# Patient Record
Sex: Male | Born: 1960
Health system: Southern US, Academic
[De-identification: ages and names within clinical notes are randomized; demographics above are authoritative.]

## PROBLEM LIST (undated history)

## (undated) ENCOUNTER — Telehealth

## (undated) ENCOUNTER — Ambulatory Visit: Attending: Urology | Primary: Urology

## (undated) ENCOUNTER — Encounter

## (undated) ENCOUNTER — Ambulatory Visit

## (undated) ENCOUNTER — Ambulatory Visit: Payer: BLUE CROSS/BLUE SHIELD

## (undated) ENCOUNTER — Ambulatory Visit: Payer: PRIVATE HEALTH INSURANCE

## (undated) ENCOUNTER — Telehealth: Attending: Urology | Primary: Urology

## (undated) ENCOUNTER — Ambulatory Visit: Attending: Pharmacist | Primary: Pharmacist

## (undated) ENCOUNTER — Encounter: Attending: Physician Assistant | Primary: Physician Assistant

## (undated) ENCOUNTER — Encounter: Payer: PRIVATE HEALTH INSURANCE | Attending: Urology | Primary: Urology

## (undated) DIAGNOSIS — F1011 Alcohol abuse, in remission: Secondary | ICD-10-CM

## (undated) DIAGNOSIS — J45909 Unspecified asthma, uncomplicated: Secondary | ICD-10-CM

## (undated) DIAGNOSIS — K219 Gastro-esophageal reflux disease without esophagitis: Secondary | ICD-10-CM

## (undated) DIAGNOSIS — G4730 Sleep apnea, unspecified: Secondary | ICD-10-CM

## (undated) DIAGNOSIS — M1990 Unspecified osteoarthritis, unspecified site: Secondary | ICD-10-CM

## (undated) DIAGNOSIS — L509 Urticaria, unspecified: Secondary | ICD-10-CM

## (undated) DIAGNOSIS — E785 Hyperlipidemia, unspecified: Secondary | ICD-10-CM

## (undated) DIAGNOSIS — G4700 Insomnia, unspecified: Secondary | ICD-10-CM

## (undated) DIAGNOSIS — F32A Depression, unspecified: Secondary | ICD-10-CM

## (undated) DIAGNOSIS — I10 Essential (primary) hypertension: Secondary | ICD-10-CM

## (undated) DIAGNOSIS — F329 Major depressive disorder, single episode, unspecified: Secondary | ICD-10-CM

## (undated) HISTORY — DX: Insomnia, unspecified: G47.00

## (undated) HISTORY — DX: Hyperlipidemia, unspecified: E78.5

## (undated) HISTORY — DX: Unspecified asthma, uncomplicated: J45.909

## (undated) HISTORY — DX: Urticaria, unspecified: L50.9

---

## 1990-04-24 HISTORY — PX: KNEE SURGERY: SHX244

## 2001-05-29 ENCOUNTER — Ambulatory Visit (HOSPITAL_COMMUNITY): Admission: RE | Admit: 2001-05-29 | Discharge: 2001-05-29 | Payer: Self-pay | Admitting: Family Medicine

## 2003-03-20 ENCOUNTER — Ambulatory Visit (HOSPITAL_COMMUNITY): Admission: RE | Admit: 2003-03-20 | Discharge: 2003-03-20 | Payer: Self-pay | Admitting: Family Medicine

## 2006-01-10 ENCOUNTER — Ambulatory Visit (HOSPITAL_COMMUNITY): Admission: RE | Admit: 2006-01-10 | Discharge: 2006-01-10 | Payer: Self-pay | Admitting: Family Medicine

## 2008-04-04 ENCOUNTER — Emergency Department (HOSPITAL_COMMUNITY): Admission: EM | Admit: 2008-04-04 | Discharge: 2008-04-04 | Payer: Self-pay | Admitting: Emergency Medicine

## 2008-04-07 ENCOUNTER — Inpatient Hospital Stay (HOSPITAL_COMMUNITY): Admission: AD | Admit: 2008-04-07 | Discharge: 2008-04-10 | Payer: Self-pay | Admitting: Family Medicine

## 2008-04-08 ENCOUNTER — Ambulatory Visit: Payer: Self-pay | Admitting: Gastroenterology

## 2008-04-29 ENCOUNTER — Ambulatory Visit: Payer: Self-pay | Admitting: Internal Medicine

## 2008-04-30 ENCOUNTER — Encounter: Payer: Self-pay | Admitting: Urgent Care

## 2008-04-30 LAB — CONVERTED CEMR LAB
AST: 56 units/L — ABNORMAL HIGH (ref 0–37)
Albumin: 4.8 g/dL (ref 3.5–5.2)
Alkaline Phosphatase: 74 units/L (ref 39–117)
Total Protein: 7.3 g/dL (ref 6.0–8.3)

## 2008-05-04 ENCOUNTER — Encounter: Payer: Self-pay | Admitting: Urgent Care

## 2008-05-21 ENCOUNTER — Encounter: Payer: Self-pay | Admitting: Urgent Care

## 2008-05-21 LAB — CONVERTED CEMR LAB
ALT: 83 units/L — ABNORMAL HIGH (ref 0–53)
Alkaline Phosphatase: 55 units/L (ref 39–117)
Indirect Bilirubin: 0.4 mg/dL (ref 0.0–0.9)
Total Protein: 6.9 g/dL (ref 6.0–8.3)

## 2008-08-12 DIAGNOSIS — R197 Diarrhea, unspecified: Secondary | ICD-10-CM

## 2008-08-12 DIAGNOSIS — E7800 Pure hypercholesterolemia, unspecified: Secondary | ICD-10-CM | POA: Insufficient documentation

## 2008-08-12 DIAGNOSIS — I10 Essential (primary) hypertension: Secondary | ICD-10-CM | POA: Insufficient documentation

## 2008-08-12 DIAGNOSIS — R509 Fever, unspecified: Secondary | ICD-10-CM

## 2008-08-12 DIAGNOSIS — F341 Dysthymic disorder: Secondary | ICD-10-CM

## 2008-08-12 DIAGNOSIS — F1010 Alcohol abuse, uncomplicated: Secondary | ICD-10-CM | POA: Insufficient documentation

## 2008-08-12 DIAGNOSIS — I491 Atrial premature depolarization: Secondary | ICD-10-CM

## 2008-08-12 DIAGNOSIS — K219 Gastro-esophageal reflux disease without esophagitis: Secondary | ICD-10-CM | POA: Insufficient documentation

## 2008-08-12 DIAGNOSIS — R1110 Vomiting, unspecified: Secondary | ICD-10-CM

## 2008-08-12 DIAGNOSIS — E860 Dehydration: Secondary | ICD-10-CM

## 2008-08-13 ENCOUNTER — Ambulatory Visit: Payer: Self-pay | Admitting: Gastroenterology

## 2008-08-13 DIAGNOSIS — K625 Hemorrhage of anus and rectum: Secondary | ICD-10-CM

## 2008-08-22 HISTORY — PX: UPPER GASTROINTESTINAL ENDOSCOPY: SHX188

## 2008-08-22 HISTORY — PX: COLONOSCOPY: SHX174

## 2008-08-27 ENCOUNTER — Ambulatory Visit: Payer: Self-pay | Admitting: Gastroenterology

## 2008-08-27 ENCOUNTER — Encounter: Payer: Self-pay | Admitting: Gastroenterology

## 2008-08-27 ENCOUNTER — Ambulatory Visit (HOSPITAL_COMMUNITY): Admission: RE | Admit: 2008-08-27 | Discharge: 2008-08-27 | Payer: Self-pay | Admitting: Gastroenterology

## 2008-08-28 ENCOUNTER — Encounter: Payer: Self-pay | Admitting: Gastroenterology

## 2008-08-31 ENCOUNTER — Encounter: Payer: Self-pay | Admitting: Gastroenterology

## 2008-10-01 ENCOUNTER — Ambulatory Visit: Payer: Self-pay | Admitting: Gastroenterology

## 2009-12-20 ENCOUNTER — Ambulatory Visit: Payer: Self-pay | Admitting: Gastroenterology

## 2009-12-20 DIAGNOSIS — E669 Obesity, unspecified: Secondary | ICD-10-CM

## 2009-12-20 DIAGNOSIS — R740 Nonspecific elevation of levels of transaminase and lactic acid dehydrogenase [LDH]: Secondary | ICD-10-CM

## 2009-12-29 ENCOUNTER — Ambulatory Visit (HOSPITAL_COMMUNITY): Admission: RE | Admit: 2009-12-29 | Discharge: 2009-12-29 | Payer: Self-pay | Admitting: Psychiatry

## 2009-12-31 ENCOUNTER — Encounter: Payer: Self-pay | Admitting: Gastroenterology

## 2010-01-04 ENCOUNTER — Ambulatory Visit (HOSPITAL_COMMUNITY): Admission: RE | Admit: 2010-01-04 | Discharge: 2010-01-04 | Payer: Self-pay | Admitting: Gastroenterology

## 2010-01-05 ENCOUNTER — Encounter (INDEPENDENT_AMBULATORY_CARE_PROVIDER_SITE_OTHER): Payer: Self-pay

## 2010-05-24 NOTE — Letter (Signed)
Summary: NUTRITION REFERRAL  NUTRITION REFERRAL   Imported By: Ave Filter 12/20/2009 11:59:58  _____________________________________________________________________  External Attachment:    Type:   Image     Comment:   External Document

## 2010-05-24 NOTE — Letter (Signed)
Summary: CT SCAN ORDER  CT SCAN ORDER   Imported By: Ave Filter 12/31/2009 09:56:55  _____________________________________________________________________  External Attachment:    Type:   Image     Comment:   External Document

## 2010-05-24 NOTE — Assessment & Plan Note (Signed)
Summary: ELEVATED LIVER ENZYMES/SS   Visit Type:  Consult/follow-up Referring Provider:  Simone Curia Primary Care Provider:  Simone Curia  Chief Complaint:  Elevated liver enzymes.  History of Present Illness: [name] is here for f/u of abnormal LFTs. He has h/o abnormal LFTs dating back to 2008/2009. Felt to have NASH +/- alcohol-related steatohepatits. Abd U/S in 2009 showed CBD 9mm, fatty liver, ?hepatomegaly and borderline splenomegaly. Viral markers negative in 2009. Iron, ferritin, AMA all unremarkable in 2010.   Overall he feels well. Some fatigue. Abd pain if eats bananas or fatty foods. No diarrhea. Heartburn controlled. No dysphagia. Weight down 20 pounds since here but gained all back plus 10 more. Averages about six pack beer per week. No pruritis. No melena, brbpr. 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 No extra excercise.  Labs 12/08/09: Cre 1.05, Total chol 298, HDL 45, LDL 180, Trig 364, Tbili 0.9, AP 59, AST 184, ALT 248, alb 4.7  Current Medications (verified): 1)  Enalapril Maleate 20 Mg Tabs (Enalapril Maleate) .... Take 1 Tablet By Mouth Twice Daily 2)  Effexor Xr 75 Mg Xr24h-Cap (Venlafaxine Hcl) .... Take 1 Tablet By Mouth Two Times A Day 3)  Ambien 10 Mg Tabs (Zolpidem Tartrate) .... As Needed 4)  Lorazepam 2 Mg Tabs (Lorazepam) .... One and One-Half Tablet At Bedtime 5)  Omeprazole 20 Mg Cpdr (Omeprazole) .... Take 1 Tablet By Mouth Once A Day 6)  Hydrochlorothiazide 25 Mg Tabs (Hydrochlorothiazide) .... Take 1 Tablet By Mouth Once A Day 7)  Verelan 240 Mg Xr24h-Cap (Verapamil Hcl) .... Take 1 Tablet By Mouth Once A Day  Allergies (verified): 1)  ! Daypro  Past History:  Past Medical History: GERD Hepatitis-Non Viral-2008, likely EtOH/Obesity Hyperlipidemia Hypertension H/O PACs Insomnia  Family History: No FH of CRC, chronic GI illnesses, or liver disease.  Social History: Married. Architect. Quit tob use over 5 yrs  ago. Drinks 6 beers per week. H/O intranasal cocaine use as teenager.   Review of Systems General:  Complains of fatigue; denies fever, chills, sweats, anorexia, weakness, and weight loss. Eyes:  Denies vision loss. ENT:  Denies nasal congestion, sore throat, hoarseness, and difficulty swallowing. CV:  Denies chest pains, palpitations, dyspnea on exertion, and peripheral edema. Resp:  Denies dyspnea at rest, dyspnea with exercise, cough, sputum, and wheezing. GI:  See HPI. GU:  Denies urinary burning and blood in urine. MS:  Denies joint pain / LOM. Derm:  Denies rash and itching. Neuro:  Denies weakness, frequent headaches, memory loss, and confusion. Psych:  Denies depression and anxiety. Endo:  Denies unusual weight change. Heme:  Denies bruising and bleeding. Allergy:  Denies hives and rash.  Vital Signs:  Patient profile:   50 year old male Height:      70 inches Weight:      257 pounds BMI:     37.01 Temp:     98.8 degrees F oral Pulse rate:   76 / minute BP sitting:   140 / 92  (left arm) Cuff size:   large  Physical Exam  General:  Well developed, well nourished, no acute distress.obese.   Head:  Normocephalic and atraumatic. Eyes:  sclera nonicteric Mouth:  OP moist. Neck:  Supple; no masses or thyromegaly. Lungs:  Clear throughout to auscultation. Heart:  Regular rate and rhythm; no murmurs, rubs,  or bruits. Abdomen:  Obese. Positive BS. NT. No HSM or masses. No abd bruit or hernia. No rebound or guarding. Exam somewhat  limited due to body habitus. Extremities:  No clubbing, cyanosis, edema or deformities noted. Neurologic:  Alert and  oriented x4;  grossly normal neurologically. Skin:  Intact without significant lesions or rashes. Cervical Nodes:  No significant cervical adenopathy. Psych:  Alert and cooperative. Normal mood and affect.  Impression & Recommendations:  Problem # 1:  TRANSAMINASES, SERUM, ELEVATED (ICD-790.4)  Chronically elevated  transaminases, dating back to 2008. Significant increase since 1/10, but similar to levels while hospitalized in 2009. Patient has gained 10-15 pounds since 04/2008. Cholesterol is significantly elevated as well. Continues to consume alcohol, patient reports modest amount. Suspect ongoing NASH +/- alcohol component as well. We need to reevaluate liver via U/S, especially given h/o dilated CBD before. I will set him up with dietician at [facility] to discuss weight management/fatty liver/chol mgt diet. Further recommendations to follow. Patient will need management of his elevated cholesterol, consider non-statin medications until further w/u of abnormal lfts.   Orders: Consultation Level III (65784) I would like to thank Dr. Lubertha South for allowing [place] to take part in the care of this nice patient.   Appended Document: ELEVATED LIVER ENZYMES/SS pT HAS ALCOHOLIC FATTY LIVER DISEASE complicated by severe obesity.

## 2010-05-24 NOTE — Letter (Signed)
Summary: ABD [place] ORDER  ABD [place] ORDER   Imported By: Ave Filter 12/20/2009 11:58:32  _____________________________________________________________________  External Attachment:    Type:   Image     Comment:   External Document

## 2010-05-24 NOTE — Letter (Signed)
Summary: Plan of Care, Need to Discuss  [facility]  [address]   [place], [place] [address]   Phone: [phone]  Fax: [phone]    January 05, 2010  [facility] [address] [place], [place]  [address] 1960/10/15   Dear Mr. STEINKE,   We are writing this letter to inform you of treatment plans and/or discuss your plan of care.  We have tried several times to contact you; however, we have yet to reach you.  We ask that you please contact our office for follow-up on your gastrointestinal issues.  We can  be reached at [phone] to schedule an appointment, or to speak with someone regarding your health care needs.  Please do not neglect your health.   Sincerely,    Hendricks Limes LPN  [facility] Ph: [phone]    Fax: [phone]

## 2010-08-03 ENCOUNTER — Other Ambulatory Visit (HOSPITAL_COMMUNITY): Payer: Self-pay | Admitting: Family Medicine

## 2010-08-03 DIAGNOSIS — R310 Gross hematuria: Secondary | ICD-10-CM

## 2010-08-04 ENCOUNTER — Ambulatory Visit (HOSPITAL_COMMUNITY)
Admission: RE | Admit: 2010-08-04 | Discharge: 2010-08-04 | Disposition: A | Discharge status: Home / Self Care | Payer: BLUE CROSS/BLUE SHIELD | Source: Ambulatory Visit | Attending: Family Medicine | Admitting: Family Medicine

## 2010-08-04 DIAGNOSIS — R310 Gross hematuria: Secondary | ICD-10-CM | POA: Insufficient documentation

## 2010-08-04 DIAGNOSIS — Q619 Cystic kidney disease, unspecified: Secondary | ICD-10-CM | POA: Insufficient documentation

## 2010-08-17 ENCOUNTER — Ambulatory Visit (INDEPENDENT_AMBULATORY_CARE_PROVIDER_SITE_OTHER): Payer: BLUE CROSS/BLUE SHIELD | Admitting: Urology

## 2010-08-17 DIAGNOSIS — N529 Male erectile dysfunction, unspecified: Secondary | ICD-10-CM

## 2010-08-17 DIAGNOSIS — R310 Gross hematuria: Secondary | ICD-10-CM

## 2010-08-17 DIAGNOSIS — N281 Cyst of kidney, acquired: Secondary | ICD-10-CM

## 2010-09-06 NOTE — Op Note (Signed)
Ryan Hickman, Ryan Hickman                ACCOUNT NO.:  [id number]   MEDICAL RECORD NO.:  [id number]          PATIENT TYPE:  AMB   LOCATION:  DAY                           FACILITY:  [facility]   PHYSICIAN:  Kassie Mends, M.D.      DATE OF BIRTH:  1961-02-04   DATE OF PROCEDURE:  DATE OF DISCHARGE:                               OPERATIVE REPORT   PROCEDURES:  1. Ileocolonoscopy with random cold forceps biopsies and snare cautery      polypectomy.  2. Esophagogastroduodenoscopy with cold forceps biopsy of the duodenal      mucosa.   INDICATION FOR EXAM:  Ryan Hickman is a 50 year old male who presents with  chronic diarrhea and rectal bleeding.   FINDINGS:  1. Extremely tortuous colon, which required him to be in the supine      position with pressure in his epigastrium and right flank in order      to successfully intubate the cecum and the terminal ileum.  2. A 6-mm sessile hemorrhagic appearing polyp in the sigmoid colon      approximately 25 cm from the anal verge was removed via snare      cautery.  Otherwise, no masses, inflammatory changes, diverticular      AVMs seen.  Random biopsies obtained via cold forceps to evaluate      for microscopic colitis as an etiology for his diarrhea.  3. Small internal hemorrhoids.  Otherwise normal retroflexed view of      the rectum.  4. Normal esophagus without evidence of Barrett's mass, erosion,      ulceration, or stricture.  5. Normal stomach, duodenal bulb, and second portion of the duodenum.      Biopsies obtained via cold forceps and the duodenum to evaluate for      celiac sprue.   DIAGNOSES:  1. No obvious source for diarrhea identified.  2. Rectal bleeding likely secondary to sigmoid colon polyp and      hemorrhoids.   RECOMMENDATIONS:  1. He should avoid beer and dairy products for the next month.  He is      given a handout on lactose-free high fiber diet.  He was also given      information on polyps and hemorrhoids.  2. We will  call him with results of his biopsies.  If no source for      his diarrhea can be identified and then a 72-hour stool collection      for fecal fat will be performed.  3. No aspirin, NSAIDs, or anticoagulation for 7 days.  4. He already has an appointment to see me on June 10.   MEDICATIONS:  1. Demerol 125 mg IV.  2. Versed 7 mg IV.   PROCEDURE TECHNIQUE:  Physical exam was performed.  Informed consent was  obtained from the patient after explaining the benefits, risks, and  alternatives to the procedure.  The patient was connected to the monitor  and placed in the left lateral position.  Continuous oxygen was provided  by nasal cannula.  IV medicine was administered through an indwelling  cannula.  After administration of sedation and rectal exam, the  patient's rectum was intubated and the scope was advanced under direct  visualization to the distal terminal ileum.  The scope was removed  slowly back after exam of color, texture, anatomy, and integrity of  mucosa on the way out.   After the colonoscopy, the patient's esophagus was intubated with the  diagnostic gastroscope and scope was advanced under direct visualization  to the second portion of the duodenum.  The scope was removed slowly by  carefully examining the color, texture, anatomy, and integrity of the  mucosa on the way out.  The patient was recovered in endoscopy and  discharged home in satisfactory condition.   PATH:  SIMPLE ADENOMA. TCS 10 YRS. NL-COLON & DUODENUM      Kassie Mends, M.D.  Electronically Signed     SM/MEDQ  D:  08/27/2008  T:  08/28/2008  Job:  [id number]   cc:   Donna Bernard, M.D.  Fax: [phone]

## 2010-09-06 NOTE — Assessment & Plan Note (Signed)
NAMESOCRATES, CAHOON                 CHART#:  [id number]   DATE:  04/29/2008                       DOB:  16-May-1960   PRIMARY CARE PHYSICIAN:  Dr. Lubertha South.   CHIEF COMPLAINT:  Followup hospitalization.   PROBLEM LIST:  1. Transaminitis felt to be due to fatty liver/alcoholic.  2. Hepatosplenomegaly.  3. Extrahepatic bile duct of 9 mm (mildly dilated).  4. Protracted viral diarrhea illness/gastroenteritis, requiring      hospitalization.  5. Alcohol abuse.  6. Chronic gastroesophageal reflux disease.  7. Hypertension.  8. Premature atrial contraction.  9. Hypercholesterolemia.  10.Right knee arthroscopy.  11.Depression and anxiety.   SUBJECTIVE:  The patient is a 50 year old Caucasian male who was  admitted to [facility] approximately 3 weeks ago with acute  diarrheal illness and transaminitis.  His symptoms subsided after about  2 weeks with supportive measures.  His bowel movements have almost  completely returned to normal.  He is having anywhere from 2-3 loose  stools per day.  His baseline is 2.  He denies any rectal bleeding or  melena.  Denies any abdominal pain.  He does note borborygmus.  He does  have some mild distention.  He occasionally has nocturnal diaphoresis.  He tells me he has gained 30 pounds in the last 2 months.  He has not  consumed any alcohol since his hospitalization for a total of about 4  weeks without alcohol consumption.  He previously was drinking about 3  beers a day, sometimes up to 6 or more days of the week than not.   Stool studies including culture.  C. diff were negative.  He did have a  positive lactoferrin.  He had negative hepatitis A, B, and C serologies.  He had an AST of 52, ALT of 130, total protein of 5.2, albumin of 2.5,  bilirubin of 0.4, and alkaline phosphatase of 64 all on April 10, 2008.  He recalls having elevated LFTs in 2008 when he was checked for  insurance.   CURRENT MEDICATIONS:  Enalapril 20 mg  daily, lorazepam unknown dose  nightly, Prilosec 20 mg daily, Celexa 20 mg daily, and a new  antihypertensive he did not bring with him.   ALLERGIES:  Daypro cause hives and swelling.   OBJECTIVE:  Vital Signs:  Weight 244 pounds, height 68 inches,  temperature 98.0, blood pressure 132/90, and pulse 80.GENERAL:  The  patient is an obese Caucasian male who is alert, oriented, pleasant, and  cooperative, in no acute distress.  He is accompanied by his wife  today.HEENT:  Sclerae clear, nonicteric.  Conjunctivae pink.  Oropharynx  pink and moist without any lesions.NECK:  Supple without any mass or  thyromegaly.CHEST:  Heart regular rate and rhythm.  Normal S1 and S2.  No murmurs, clicks, rubs, or gallops.ABDOMEN:  Protuberant.  Positive  bowel sounds x4.  No bruits auscultated.  He does have  hepatosplenomegaly.  Liver is palpable 2 fingerbreadths in the right  costal margin.  He has splenomegaly as well.EXTREMITIES:  Without  clubbing or edema.  There is no asterixis.SKIN:  Without jaundice or  rash.   ASSESSMENT:  The patient is a 50 year old Caucasian male admitted with  acute diarrheal illness/gastroenteritis in last month, which has  resolved.  He was also noted to have transaminitis, which  I suspect was  related to underlying obesity/alcoholic hepatitis. His mildly dilated  extrahepatic bile duct  is not clincally significant. I have discussed  this case with Dr. Kassie Mends.  The plan of care is outlined below.   PLAN:  1. Gradual weight loss 1-2 pounds a week, low-salt and low-cholesterol      diet and exercise.  2. Avoid alcohol.  3. Repeat LFTs and make further recommendations.       Lorenza Burton, N.P.  Electronically Signed     Kassie Mends, M.D.  Electronically Signed    KJ/MEDQ  D:  04/29/2008  T:  04/29/2008  Job:  [id number]   cc:   Donna Bernard, M.D.

## 2010-09-06 NOTE — Consult Note (Signed)
NAMEAUTHER, LYERLY                ACCOUNT NO.:  [id number]   MEDICAL RECORD NO.:  [id number]          PATIENT TYPE:  INP   LOCATION:  A323                          FACILITY:  [facility]   PHYSICIAN:  Kassie Mends, M.D.      DATE OF BIRTH:  [birth year]   DATE OF CONSULTATION:  04/08/2008  DATE OF DISCHARGE:                                 CONSULTATION   REASON FOR CONSULTATION:  Elevated liver function tests.   HISTORY OF PRESENT ILLNESS:  The patient is a 50 year old Caucasian  gentleman who was admitted basically with dehydration from persistent  diarrhea.  Ten days ago the patient developed a couple of episodes of  vomiting.  This was followed by diarrhea.  He states he was having  upwards of 30 stools a day.  He did develop some subjective chills,  sweats.  He states he did have a fever as well.  He complained of  diffuse muscle aches.  Symptoms were persisting so he was seen in the  emergency department on April 04, 2008.  He was given fluids and sent  home.  At that time his creatinine was 1.7.  He also had elevated  transaminases with an AST of 194 and ALT of 256.  The patient states  that he was just not able to continue with the protracted diarrhea at  home and he was felt to be orthostatic and dehydrated and therefore was  admitted.  He states he has had elevated LFT's back in 2008 after he was  started on lovastatin.  He states he recently had a life insurance exam  and was told he had elevated LFT's but they did approve him for the  policy.  He states he was checked for viral hepatitis with that exam and  these were negative.  He admittedly drinks about 24 ounces of beer  daily.  He states he has done so for the last six months. He states he  really has not had a chronic history of this, however.  He has a remote  history of intranasal cocaine use.  He has lost about 12 pounds in the  last week due to this illness.  Prior to that he has had an unspecified  amount of weight  gain.  He has chronic GERD, well controlled on  omeprazole.  He denies any dysphagia, odynophagia, hematemesis, melena  or rectal bleeding.  He denies any recent antibiotic use.  No travel  abroad.  No ill contacts.   MEDICATIONS AT HOME:  1. Lovastatin 20 mg daily.  2. Enalapril 20 mg daily.  3. Lorazepam nightly.  4. Prilosec 20 mg daily.  5. Celexa 20 mg daily.   ALLERGIES:  DAYPRO caused anaphylaxis.   PAST MEDICAL HISTORY:  1. Chronic GERD.  2. Hypertension.  3. PAC's.  4. Hypercholesterolemia.  5. History of elevated liver function tests in 2008, as above.  6. Right knee arthroscopy.   FAMILY HISTORY:  Negative for chronic GI illnesses, liver disease,  colorectal cancer.  Positive for CAD and cardiac arrhythmia's.   SOCIAL HISTORY:  He is married.  He has been laid off.  He states he  worked for [employer] for 13 years and then got mad and quit.  He states  he was working for a friend more recently but got laid off.  He has been  out of work since October of 2009.  He quit smoking about 4 years ago.  Remote cocaine use, intranasal as a teenager.  Alcohol use as above.   REVIEW OF SYSTEMS:  See HPI for GI and CONSTITUTIONAL.  CARDIOPULMONARY:  Denies chest pain, shortness of breath, palpitations or cough.  GENITOURINARY:  Denies dysuria or hematuria.   PHYSICAL EXAMINATION:  VITAL SIGNS:  Height 68 inches.  Weight 108 kg.  BMI is 36.  Temperature 97.6, pulse 74, respirations 16, blood pressure  125/75, oxygen saturation 98% on room air.  GENERAL:  Pleasant, obese Caucasian gentleman in no acute distress.  SKIN:  Warm and dry.  No jaundice.  HEENT:  Sclerae nonicteric. Oropharyngeal mucosa moist and pink.  No  lesions, erythema or exudate.  No lymphadenopathy or thyromegaly.  LUNGS:  Clear to auscultation.  CARDIAC:  Exam reveals regular rate and rhythm with normal S1, S2.  No  murmurs, rubs or gallops.  ABDOMEN:  Positive bowel sounds.  Abdomen is obese, soft.  He has  mild  diffuse tenderness to deep palpation.  No rebound or guarding.  No  masses. Liver edge easily palpable below the right costal margin in the  midclavicular line, nontender.  No abdominal hernias or bruits.  LOWER EXTREMITIES:  No edema.   LABORATORY DATA:  White blood cell count 9400, hemoglobin 13, platelet  count 223,000.  Amylase 82.  Sodium 140, potassium 3.9, BUN 5,  creatinine 1.02, glucose 115.  Current total bilirubin is 0.5, alkaline  phosphatase 60, AST currently 69, ALT 171, albumin 2.5.  Abdominal  ultrasound:  Liver is echogenic and mildly enlarged.  Extrahepatic bile  duct was 9 mm, mildly dilated.  Borderline splenomegaly.   IMPRESSION:  Patient is a 50 year old gentleman with acute  gastroenteritis.  He likely has a protracted viral illness.  In  addition, he has elevated transaminases and gives a history of elevated  liver function tests, at least dating back to 2008, after lovastatin was  initiated.  He recently had elevated liver function tests with a life  insurance exam.  I do not have the particular labs for details.  Abdominal ultrasound suggests mild hepatomegaly and borderline  splenomegaly with echogenic liver.  He likely has steatohepatitis,  multifactorial with obesity, daily alcohol use.  Suspect that he does  have a superimposed problem exacerbating his elevated liver function  tests, likely due to cholestasis or a viral illness.  Given his history  of remote drug use, chronic hepatitis needs to be excluded.  Transaminase pattern is not typical of acute viral hepatitis.  He does  not clinically have any typical biliary symptoms but will need to have  his dilated extrahepatic bile ducts evaluated at a later date.   RECOMMENDATIONS:  1. Would initially follow up stool studies, hepatitis panel.  Will go      ahead and add a Clostridium difficile per Dr. Ulyses Southward request.  2. Repeat liver function tests in the morning.  3. Encouraged patient to ask for  Bentyl or Lomotil for diarrhea.  4. Will go ahead and advance him to a low fat, low residue, lactose-      free diet as tolerated.  5. Clinically, no indication for ERCP at this time.  He will likely      need an EUS as an outpatient, however.  6. Advised him to achieve slow, gradual weight loss, approximately 5      to 10 pounds a month, alcohol cessation and daily aerobic exercise      for steatohepatitis.   We would like to thank Dr. Lubertha South for allowing [place] to take part in  the care of this patient.   ADDENDUM 11914:  Pt likely has EtOH hepatitis and is obese.      Tana Coast, P.A.      Kassie Mends, M.D.  Electronically Signed    LL/MEDQ  D:  04/08/2008  T:  04/08/2008  Job:  [id number]

## 2010-09-06 NOTE — H&P (Signed)
NAMEJACON, Ryan Hickman                ACCOUNT NO.:  [id number]   MEDICAL RECORD NO.:  [id number]          PATIENT TYPE:  INP   LOCATION:  A323                          FACILITY:  [facility]   PHYSICIAN:  Scott A. Gerda Diss, MD    DATE OF BIRTH:  1960-12-10   DATE OF ADMISSION:  04/07/2008  DATE OF DISCHARGE:  LH                              HISTORY & PHYSICAL   CHIEF COMPLAINT:  Abdominal discomfort, diarrhea and vomiting.   HISTORY OF PRESENT ILLNESS:  This 50 year old male who has had  approximately 5 days of vomiting and diarrhea.  He began approximately 5-  7 days ago with diarrhea first and then developed vomiting, fever,  headache, body aches, not feeling good.  Progressive diarrhea multiple  times per day ongoing.  He states at times very loose, other times  somewhat mucousy.  Denies any blood in it.  He states he has had off and  on fevers, had headache, muscle aches and discomfort.  He states his  energy level has been subpar.  He also relates intermittent vomiting  over the past several days.  He went to the ER this past Saturday and at  that time they did blood work, which showed elevated liver enzymes of  AST 194 and ALT of 256.  Then today, he presents to the office with the  above symptoms stating he has tried Gatorade and clear liquids and just  feels worse.   PAST MEDICAL HISTORY:  No prior gallbladder or liver troubles.  No prior  gastroenteritis.  No foreign travel.  No camping or drinking of suspect  water.  No contact with anyone with similar symptoms.   FAMILY HISTORY:  Noncontributory.  Positive for diabetes and heart  disease.   PAST MEDICAL HISTORY:  Pertinent for chronic reflux, hypertension, PACs.  He does have a history of smoking and rarely drinks any alcohol.   ALLERGIES:  HE IS ALLERGIC TO DAYPRO.   MEDICATIONS:  1. Lovastatin 20 mg daily.  2. Enalapril 20 mg daily.  3. Lorazepam 2 mg 1-1/2 mL each evening.  4. Celexa 20 mg daily.   It should also be  noted that the patient's last liver profile shows a  slight elevation in liver enzymes in 2008.  It should also be noted that  he states he had a life insurance checkup just about a month and a half  ago and was told everything including liver, HIV, etc. was normal.   PHYSICAL EXAMINATION:  GENERAL:  He looks to feel quite ill.  HEENT:  TMs-NL, T-NL, MM-tacky.  NECK:  Supple.  LUNGS:  Clear.  HEART:  In the 90s, no gallop.  ABDOMEN:  Soft.  Subjective discomfort throughout.  EXTREMITIES:  No edema.   Urinalysis; no ketones.  Blood pressure laying down was 120/80 with a  heart rate of 90, sitting up it was 100/70 with heart rate at 110,  standing it was 80/50 with heart rate of 125-130.   ASSESSMENT/PLAN:  Gastroenteritis with volume depletion, failed  outpatient management.  Recommend IV fluids.  Also recommend checking  lab work  and progressing accordingly.  I expect the patient to gradually  improve.  Will have to look at the liver enzymes again and consider a  hepatitis panel because of elevated liver enzymes.  Monitor the patient  closely.      Scott A. Gerda Diss, MD  Electronically Signed     SAL/MEDQ  D:  04/08/2008  T:  04/08/2008  Job:  [id number]

## 2010-09-09 NOTE — Discharge Summary (Signed)
NAMEJONAVON, Ryan Hickman                ACCOUNT NO.:  [id number]   MEDICAL RECORD NO.:  [id number]          PATIENT TYPE:  INP   LOCATION:  A323                          FACILITY:  [facility]   PHYSICIAN:  Donna Bernard, M.D.DATE OF BIRTH:  [birth year]   DATE OF ADMISSION:  04/07/2008  DATE OF DISCHARGE:  12/18/2009LH                               DISCHARGE SUMMARY   FINAL DIAGNOSES:  1. Gastroenteritis.  2. Abdominal pain secondary to gastroenteritis.  3. Elevated liver enzymes.  4. Hypertension.  5. Hyperlipidemia.  6. Reflux.   DISPOSITION:  1. The patient discharged to home.  2. Discharge medications, low-fat diet, no milk products for the next      7 days.  3. Over-the-counter Imodium as needed for diarrhea.  4. Maintain all chronic meds except hold lovastatin for now.  5. Followup in the office in a week.   INITIAL HISTORY AND PHYSICAL:  Please see H and P as dictated.   HOSPITAL COURSE:  This patient is a 50 year old male with history of  hyperlipidemia, hypertension, anxiety, who presented to the hospital on  the day of admission with a history of 5 days of vomiting and diarrhea.  The patient also had headaches and body aches.  He felt very poorly.  He  had been to the emergency room days previous which revealed elevated  liver enzymes in the neighborhood of 194 AST and 256 for the ALT.  The  patient was admitted to hospital for evaluation.  He was started on IV  rehydration due to his dehydration.  Of note, he was orthostatic upon  presentation with his blood pressure dropping full 40 points when going  from supine to standing.  The GI folks were consulted.  They recommended  C. difficile testing.  Over the next several days, the patient gradually  improved.  We did do some further testing of his renal function which  revealed resolution of his hypokalemia.  The patient's liver enzymes  improved markedly, and on the day of discharge his AST was 52, his ALT  was 130.   Hepatitis panel was done and this returned negative.  His C.  diff tests and stool culture tests were negative, and on the day of  discharge he was still having loose stools, but he had noted significant  improvement.  GI folks went to see him in followup at  their office.  The patient was discharged home with diagnosis and  disposition as noted above.  It should also be noted that we did an  abdominal ultrasound which revealed a fatty liver which was felt to be  part of the etiology to his elevated liver enzymes.      Donna Bernard, M.D.  Electronically Signed     WSL/MEDQ  D:  05/18/2008  T:  05/18/2008  Job:  [id number]

## 2010-09-09 NOTE — Procedures (Signed)
[facility]  Patient:    FRANKO, HILLIKER Visit Number: [id number] MRN: [id number]          Service Type: Attending:  Donna Bernard, M.D. Dictated by:   Donna Bernard, M.D. Proc. Date: 05/29/01                                Stress Test  INDICATION FOR TEST:  The patient had intermittent palpitations along with risk factors for heart disease including male sex, age over 85, and chronic smoking habit.  STRESS TEST:  The stress test was performed under standard Bruce protocol. Resting EKG revealed normal sinus rhythm with no significant ST-T changes. The patient tolerated the first stage well.  During the second stage, there were nonspecific changes with the ST segments The patient then went on to the third stage and had a normal hypertensive response as expected with a blood pressure of 190/94.  At 45 seconds into the fourth stage, the patient reached a maximum heart rate of 160 which surpassed maximal predicted heart rate of 153.  At this rate, there were no ST segments depressed more than 1.0 mm, and in those segments where the depression was less than that, the slope of the ST segment was ascending.  IMPRESSION:  Negative adequate stress test.  PLAN:   The patient was encouraged to have an exercise program and to stop smoking. Dictated by:   Donna Bernard, M.D. Attending:  Donna Bernard, M.D. DD:  09/29/01 TD:  10/01/01 Job: [id number]

## 2010-09-09 NOTE — Procedures (Signed)
NAME:  Ryan Hickman, Ryan Hickman                          ACCOUNT NO.:  [id number]   MEDICAL RECORD NO.:  [id number]                   PATIENT TYPE:  OUT   LOCATION:  DFTL                                 FACILITY:  [facility]   PHYSICIAN:  Donna Bernard, M.D.             DATE OF BIRTH:  19-Jan-1961   DATE OF PROCEDURE:  03/20/2003  DATE OF DISCHARGE:  03/20/2003                                    STRESS TEST   INDICATIONS FOR TEST:  Intermittent chest pain in a 23-something-year-old  gentleman with his strong family history of coronary artery disease  including a sibling and personal risk factors including elevated cholesterol  and chronic smoking.  The pain is atypical at best.   RESULTS:  Stress test was performed at standard Bruce protocol.  Resting EKG  revealed normal sinus rhythm with occasional PACs and no significant ST and  T changes.  The patient tolerated the first two stages well.  During the  third stage, his frequent PACs became even more common.  He surpassed his  submaximal predicted heart rate of 156 and reached a heart rate in the low  160s.  He had a normal hypertensive response as expected.  At the peak heart  rate at 0.08 seconds pass the J-point, there was no significant ST segment  changes in any of the leads.   IMPRESSION:  Negative adequate stress test.   PLAN:  The patient is encouraged to exercise regularly and cut back on  smoking.  Will check another lipid profile.  Follow up in the office as  scheduled.      ___________________________________________                                            Donna Bernard, M.D.   WSL/MEDQ  D:  03/25/2003  T:  03/25/2003  Job:  [id number]

## 2011-01-27 LAB — BASIC METABOLIC PANEL
BUN: 5 mg/dL — ABNORMAL LOW (ref 6–23)
CO2: 25 mEq/L (ref 19–32)
CO2: 26 mEq/L (ref 19–32)
Calcium: 8.8 mg/dL (ref 8.4–10.5)
Chloride: 109 mEq/L (ref 96–112)
GFR calc Af Amer: >60 mL/min (ref >60)
GFR calc non Af Amer: >60 mL/min (ref >60)
Glucose, Bld: 115 mg/dL — ABNORMAL HIGH (ref 70–99)
Glucose, Bld: 118 mg/dL — ABNORMAL HIGH (ref 70–99)
Potassium: 3.1 mEq/L — ABNORMAL LOW (ref 3.5–5.1)
Potassium: 3.9 mEq/L (ref 3.5–5.1)
Sodium: 138 mEq/L (ref 135–145)
Sodium: 140 mEq/L (ref 135–145)

## 2011-01-27 LAB — COMPREHENSIVE METABOLIC PANEL
AST: 194 U/L — ABNORMAL HIGH (ref 0–37)
BUN: 31 mg/dL — ABNORMAL HIGH (ref 6–23)
CO2: 21 mEq/L (ref 19–32)
Calcium: 9 mg/dL (ref 8.4–10.5)
Creatinine, Ser: 1.7 mg/dL — ABNORMAL HIGH (ref 0.4–1.5)
GFR calc Af Amer: 53 mL/min — ABNORMAL LOW (ref >60)
GFR calc non Af Amer: 44 mL/min — ABNORMAL LOW (ref >60)
Total Bilirubin: 0.6 mg/dL (ref 0.3–1.2)

## 2011-01-27 LAB — HEPATIC FUNCTION PANEL
ALT: 130 U/L — ABNORMAL HIGH (ref 0–53)
ALT: 146 U/L — ABNORMAL HIGH (ref 0–53)
ALT: 197 U/L — ABNORMAL HIGH (ref 0–53)
AST: 52 U/L — ABNORMAL HIGH (ref 0–37)
AST: 69 U/L — ABNORMAL HIGH (ref 0–37)
AST: 80 U/L — ABNORMAL HIGH (ref 0–37)
Albumin: 2.5 g/dL — ABNORMAL LOW (ref 3.5–5.2)
Albumin: 2.5 g/dL — ABNORMAL LOW (ref 3.5–5.2)
Albumin: 2.7 g/dL — ABNORMAL LOW (ref 3.5–5.2)
Alkaline Phosphatase: 57 U/L (ref 39–117)
Alkaline Phosphatase: 64 U/L (ref 39–117)
Bilirubin, Direct: 0.1 mg/dL (ref 0.0–0.3)
Bilirubin, Direct: 0.2 mg/dL (ref 0.0–0.3)
Indirect Bilirubin: 0.2 mg/dL — ABNORMAL LOW (ref 0.3–0.9)
Indirect Bilirubin: 0.3 mg/dL (ref 0.3–0.9)
Total Bilirubin: 0.4 mg/dL (ref 0.3–1.2)
Total Bilirubin: 0.4 mg/dL (ref 0.3–1.2)
Total Protein: 5.1 g/dL — ABNORMAL LOW (ref 6.0–8.3)
Total Protein: 5.2 g/dL — ABNORMAL LOW (ref 6.0–8.3)
Total Protein: 5.2 g/dL — ABNORMAL LOW (ref 6.0–8.3)

## 2011-01-27 LAB — URINALYSIS, ROUTINE W REFLEX MICROSCOPIC
Bilirubin Urine: NEGATIVE
Glucose, UA: NEGATIVE mg/dL
Glucose, UA: NEGATIVE mg/dL
Hgb urine dipstick: NEGATIVE
Nitrite: NEGATIVE
Protein, ur: NEGATIVE mg/dL
Specific Gravity, Urine: 1.01 (ref 1.005–1.030)
Specific Gravity, Urine: 1.015 (ref 1.005–1.030)
Urobilinogen, UA: 0.2 mg/dL (ref 0.0–1.0)
pH: 5.5 (ref 5.0–8.0)

## 2011-01-27 LAB — DIFFERENTIAL
Basophils Absolute: 0 10*3/uL (ref 0.0–0.1)
Basophils Absolute: 0.1 10*3/uL (ref 0.0–0.1)
Basophils Relative: 1 % (ref 0–1)
Eosinophils Relative: 2 % (ref 0–5)
Lymphocytes Relative: 18 % (ref 12–46)
Lymphocytes Relative: 19 % (ref 12–46)
Lymphs Abs: 0.9 10*3/uL (ref 0.7–4.0)
Neutro Abs: 3.2 10*3/uL (ref 1.7–7.7)
Neutro Abs: 6.6 10*3/uL (ref 1.7–7.7)
Neutrophils Relative %: 62 % (ref 43–77)
Neutrophils Relative %: 67 % (ref 43–77)

## 2011-01-27 LAB — FECAL LACTOFERRIN, QUANT: Fecal Lactoferrin: POSITIVE

## 2011-01-27 LAB — CLOSTRIDIUM DIFFICILE EIA: C difficile Toxins A+B, EIA: NEGATIVE

## 2011-01-27 LAB — CARDIAC PANEL(CRET KIN+CKTOT+MB+TROPI)
CK, MB: 1.1 ng/mL (ref 0.3–4.0)
Relative Index: INVALID (ref 0.0–2.5)
Total CK: 41 U/L (ref 7–232)

## 2011-01-27 LAB — CBC
HCT: 39.7 % (ref 39.0–52.0)
Hemoglobin: 13 g/dL (ref 13.0–17.0)
MCHC: 35.3 g/dL (ref 30.0–36.0)
MCHC: 35.3 g/dL (ref 30.0–36.0)
MCV: 88.5 fL (ref 78.0–100.0)
RBC: 4.13 MIL/uL — ABNORMAL LOW (ref 4.22–5.81)
RBC: 4.48 MIL/uL (ref 4.22–5.81)
RDW: 12.6 % (ref 11.5–15.5)

## 2011-01-27 LAB — HEPATITIS PANEL, ACUTE
HCV Ab: NEGATIVE
Hep A IgM: NEGATIVE

## 2011-01-27 LAB — STOOL CULTURE

## 2011-01-30 ENCOUNTER — Emergency Department (HOSPITAL_COMMUNITY): Payer: BC Managed Care – PPO

## 2011-01-30 ENCOUNTER — Encounter: Payer: Self-pay | Admitting: Emergency Medicine

## 2011-01-30 ENCOUNTER — Emergency Department (HOSPITAL_COMMUNITY)
Admission: EM | Admit: 2011-01-30 | Discharge: 2011-01-30 | Disposition: A | Discharge status: Home / Self Care | Payer: BC Managed Care – PPO | Attending: Emergency Medicine | Admitting: Emergency Medicine

## 2011-01-30 DIAGNOSIS — K5289 Other specified noninfective gastroenteritis and colitis: Secondary | ICD-10-CM | POA: Insufficient documentation

## 2011-01-30 DIAGNOSIS — K529 Noninfective gastroenteritis and colitis, unspecified: Secondary | ICD-10-CM

## 2011-01-30 DIAGNOSIS — Z79899 Other long term (current) drug therapy: Secondary | ICD-10-CM | POA: Insufficient documentation

## 2011-01-30 DIAGNOSIS — R7989 Other specified abnormal findings of blood chemistry: Secondary | ICD-10-CM | POA: Insufficient documentation

## 2011-01-30 DIAGNOSIS — R197 Diarrhea, unspecified: Secondary | ICD-10-CM | POA: Insufficient documentation

## 2011-01-30 DIAGNOSIS — Z87891 Personal history of nicotine dependence: Secondary | ICD-10-CM | POA: Insufficient documentation

## 2011-01-30 DIAGNOSIS — R1110 Vomiting, unspecified: Secondary | ICD-10-CM | POA: Insufficient documentation

## 2011-01-30 DIAGNOSIS — R109 Unspecified abdominal pain: Secondary | ICD-10-CM | POA: Insufficient documentation

## 2011-01-30 HISTORY — DX: Major depressive disorder, single episode, unspecified: F32.9

## 2011-01-30 HISTORY — DX: Essential (primary) hypertension: I10

## 2011-01-30 HISTORY — DX: Depression, unspecified: F32.A

## 2011-01-30 HISTORY — DX: Gastro-esophageal reflux disease without esophagitis: K21.9

## 2011-01-30 LAB — CBC
HCT: 46.1 % (ref 39.0–52.0)
MCH: 32.1 pg (ref 26.0–34.0)
MCV: 91.5 fL (ref 78.0–100.0)
Platelets: 204 10*3/uL (ref 150–400)
RBC: 5.04 MIL/uL (ref 4.22–5.81)
WBC: 12.4 10*3/uL — ABNORMAL HIGH (ref 4.0–10.5)

## 2011-01-30 LAB — COMPREHENSIVE METABOLIC PANEL
Alkaline Phosphatase: 73 U/L (ref 39–117)
BUN: 14 mg/dL (ref 6–23)
CO2: 23 mEq/L (ref 19–32)
Chloride: 100 mEq/L (ref 96–112)
Creatinine, Ser: 0.88 mg/dL (ref 0.50–1.35)
GFR calc Af Amer: >90 mL/min (ref >90)
GFR calc non Af Amer: >90 mL/min (ref >90)
Glucose, Bld: 99 mg/dL (ref 70–99)
Potassium: 3.5 mEq/L (ref 3.5–5.1)
Total Bilirubin: 1 mg/dL (ref 0.3–1.2)

## 2011-01-30 LAB — DIFFERENTIAL
Basophils Relative: 0 % (ref 0–1)
Eosinophils Relative: 9 % — ABNORMAL HIGH (ref 0–5)
Lymphs Abs: 3.2 10*3/uL (ref 0.7–4.0)
Monocytes Absolute: 1.1 10*3/uL — ABNORMAL HIGH (ref 0.1–1.0)
Monocytes Relative: 9 % (ref 3–12)
Neutro Abs: 7 10*3/uL (ref 1.7–7.7)

## 2011-01-30 LAB — URINALYSIS, ROUTINE W REFLEX MICROSCOPIC
Glucose, UA: NEGATIVE mg/dL
Ketones, ur: NEGATIVE mg/dL
Leukocytes, UA: NEGATIVE
Protein, ur: NEGATIVE mg/dL
Urobilinogen, UA: 0.2 mg/dL (ref 0.0–1.0)

## 2011-01-30 LAB — LIPASE, BLOOD: Lipase: 22 U/L (ref 11–59)

## 2011-01-30 MED ORDER — SODIUM CHLORIDE 0.9 % IV BOLUS (SEPSIS)
500.0000 mL | Freq: Once | INTRAVENOUS | Status: AC
  Administered 2011-01-30: 500 mL via INTRAVENOUS

## 2011-01-30 MED ORDER — HYDROMORPHONE HCL 1 MG/ML IJ SOLN
1.0000 mg | Freq: Once | INTRAMUSCULAR | Status: AC
  Administered 2011-01-30: 1 mg via INTRAVENOUS
  Filled 2011-01-30: qty 1

## 2011-01-30 MED ORDER — SODIUM CHLORIDE 0.9 % IV BOLUS (SEPSIS)
1000.0000 mL | Freq: Once | INTRAVENOUS | Status: AC
  Administered 2011-01-30: 1000 mL via INTRAVENOUS

## 2011-01-30 MED ORDER — IOHEXOL 300 MG/ML  SOLN
100.0000 mL | Freq: Once | INTRAMUSCULAR | Status: AC | PRN
  Administered 2011-01-30: 100 mL via INTRAVENOUS

## 2011-01-30 MED ORDER — PANTOPRAZOLE SODIUM 40 MG IV SOLR
40.0000 mg | Freq: Once | INTRAVENOUS | Status: AC
  Administered 2011-01-30: 40 mg via INTRAVENOUS
  Filled 2011-01-30: qty 40

## 2011-01-30 MED ORDER — DIPHENOXYLATE-ATROPINE 2.5-0.025 MG PO TABS: 1.0000 | ORAL_TABLET | Freq: Four times a day (QID) | ORAL | Status: AC | PRN

## 2011-01-30 MED ORDER — ONDANSETRON 4 MG PO TBDP
4.0000 mg | ORAL_TABLET | Freq: Once | ORAL | Status: AC
  Administered 2011-01-30: 4 mg via ORAL
  Filled 2011-01-30: qty 1

## 2011-01-30 MED ORDER — HYDROCODONE-ACETAMINOPHEN 5-325 MG PO TABS: 1.0000 | ORAL_TABLET | ORAL | Status: AC | PRN

## 2011-01-30 MED ORDER — ONDANSETRON HCL 4 MG PO TABS: 4.0000 mg | ORAL_TABLET | Freq: Four times a day (QID) | ORAL | Status: AC

## 2011-01-30 NOTE — ED Notes (Signed)
Pt c/o lower abd cramping.  edp notified and orders received.

## 2011-01-30 NOTE — ED Provider Notes (Addendum)
History     CSN: [id number] Arrival date & time: 01/30/2011 10:57 AM  Chief Complaint  Patient presents with  . Emesis  . Diarrhea  . Abdominal Pain   patient has had nausea vomiting and diarrhea since last Tuesday (6 days) also complains of diffuse abdominal pain particularly in the right and left lower quadrant. Vomitus is described as undigested food. Diarrhea is watery and frothy. Patient had an episode 3 years ago where his LFTs were elevated for unknown reasons. A colonoscopy and an endoscopy 3-4 years ago by Dr. Darrick Penna was negative. 6 months ago he had an evaluation for hematuria including a negative cystoscopy and negative ultrasound of his kidneys. He drinks moderately. No smoking. As a Curator. Has hypertension and hypercholesterolemia  (Consider location/radiation/quality/duration/timing/severity/associated sxs/prior treatment) Patient is a 50 y.o. male presenting with vomiting, diarrhea, and abdominal pain.  Emesis  Associated symptoms include abdominal pain and diarrhea.  Diarrhea The primary symptoms include abdominal pain, vomiting and diarrhea.  Abdominal Pain The primary symptoms of the illness include abdominal pain, vomiting and diarrhea.    Past Medical History  Diagnosis Date  . Hypertension   . Depression   . Acid reflux     Past Surgical History  Procedure Date  . Knee surgery   . Nasal endoscopy   . Colonoscopy     No family history on file.  History  Substance Use Topics  . Smoking status: Former Games developer  . Smokeless tobacco: Not on file  . Alcohol Use: Yes     couple days a week      Review of Systems  Gastrointestinal: Positive for vomiting, abdominal pain and diarrhea.  All other systems reviewed and are negative.    Allergies  Daypro and Levaquin  Home Medications   Current Outpatient Rx  Name Route Sig Dispense Refill  . ASPIRIN 81 MG PO CHEW Oral Chew 81 mg by mouth daily.      Marland Kitchen DIPHENOXYLATE-ATROPINE 2.5-0.025 MG PO TABS  Oral Take 1 tablet by mouth 3 (three) times daily as needed. diarrhea    . ENALAPRIL MALEATE 20 MG PO TABS Oral Take 20 mg by mouth 2 (two) times daily.      Marland Kitchen HYDROCHLOROTHIAZIDE 25 MG PO TABS Oral Take 25 mg by mouth every morning.      Marland Kitchen LANSOPRAZOLE 15 MG PO CPDR Oral Take 15 mg by mouth every morning.      Marland Kitchen LORAZEPAM 1 MG PO TABS Oral Take 2.5 mg by mouth at bedtime.      . VENLAFAXINE HCL 75 MG PO TABS Oral Take 75 mg by mouth 2 (two) times daily.      Marland Kitchen VERAPAMIL HCL ER 240 MG PO TBCR Oral Take 240 mg by mouth at bedtime.        BP 131/82  Pulse 96  Temp 99.1 F (37.3 C)  Resp 19  Ht 5\' 9"  (1.753 m)  Wt 260 lb (117.935 kg)  BMI 38.40 kg/m2  SpO2 97%  Physical Exam  Nursing note and vitals reviewed. Constitutional: He is oriented to person, place, and time. He appears well-developed and well-nourished.       Obese  HENT:  Head: Normocephalic and atraumatic.  Eyes: Conjunctivae and EOM are normal. Pupils are equal, round, and reactive to light.  Neck: Normal range of motion. Neck supple.  Cardiovascular: Normal rate and regular rhythm.   Pulmonary/Chest: Effort normal and breath sounds normal.  Abdominal: Soft. Bowel sounds are normal.  Minimal right lower quadrant and left lower quadrant tenderness. Abdomen is protuberant secondary to obesity. No acute abdomen  Musculoskeletal: Normal range of motion.  Neurological: He is alert and oriented to person, place, and time.  Skin: Skin is warm and dry.  Psychiatric: He has a normal mood and affect.    ED Course  Procedures (including critical care time)  Labs Reviewed  CBC - Abnormal; Notable for the following:    WBC 12.4 (*)    All other components within normal limits  DIFFERENTIAL - Abnormal; Notable for the following:    Eosinophils Relative 9 (*)    Monocytes Absolute 1.1 (*)    Eosinophils Absolute 1.1 (*)    All other components within normal limits  COMPREHENSIVE METABOLIC PANEL - Abnormal; Notable for  the following:    AST 112 (*)    ALT 261 (*)    All other components within normal limits  URINALYSIS, ROUTINE W REFLEX MICROSCOPIC  LIPASE, BLOOD   No results found. Results for orders placed during the hospital encounter of 01/30/11  CBC      Component Value Range   WBC 12.4 (*) 4.0 - 10.5 (K/uL)   RBC 5.04  4.22 - 5.81 (MIL/uL)   Hemoglobin 16.2  13.0 - 17.0 (g/dL)   HCT 56.2  13.0 - 86.5 (%)   MCV 91.5  78.0 - 100.0 (fL)   MCH 32.1  26.0 - 34.0 (pg)   MCHC 35.1  30.0 - 36.0 (g/dL)   RDW 78.4  69.6 - 29.5 (%)   Platelets 204  150 - 400 (K/uL)  DIFFERENTIAL      Component Value Range   Neutrophils Relative 56  43 - 77 (%)   Lymphocytes Relative 26  12 - 46 (%)   Monocytes Relative 9  3 - 12 (%)   Eosinophils Relative 9 (*) 0 - 5 (%)   Basophils Relative 0  0 - 1 (%)   Neutro Abs 7.0  1.7 - 7.7 (K/uL)   Lymphs Abs 3.2  0.7 - 4.0 (K/uL)   Monocytes Absolute 1.1 (*) 0.1 - 1.0 (K/uL)   Eosinophils Absolute 1.1 (*) 0.0 - 0.7 (K/uL)   Basophils Absolute 0.0  0.0 - 0.1 (K/uL)   WBC Morphology ATYPICAL LYMPHOCYTES     Smear Review LARGE PLATELETS PRESENT    COMPREHENSIVE METABOLIC PANEL      Component Value Range   Sodium 135  135 - 145 (mEq/L)   Potassium 3.5  3.5 - 5.1 (mEq/L)   Chloride 100  96 - 112 (mEq/L)   CO2 23  19 - 32 (mEq/L)   Glucose, Bld 99  70 - 99 (mg/dL)   BUN 14  6 - 23 (mg/dL)   Creatinine, Ser 2.84  0.50 - 1.35 (mg/dL)   Calcium 9.0  8.4 - 13.2 (mg/dL)   Total Protein 6.7  6.0 - 8.3 (g/dL)   Albumin 3.6  3.5 - 5.2 (g/dL)   AST 440 (*) 0 - 37 (U/L)   ALT 261 (*) 0 - 53 (U/L)   Alkaline Phosphatase 73  39 - 117 (U/L)   Total Bilirubin 1.0  0.3 - 1.2 (mg/dL)   GFR calc non Af Amer >90  >90 (mL/min)   GFR calc Af Amer >90  >90 (mL/min)  URINALYSIS, ROUTINE W REFLEX MICROSCOPIC      Component Value Range   Color, Urine YELLOW  YELLOW    Appearance CLEAR  CLEAR    Specific Gravity, Urine 1.020  1.005 - 1.030    pH 6.0  5.0 - 8.0    Glucose, UA  NEGATIVE  NEGATIVE (mg/dL)   Hgb urine dipstick NEGATIVE  NEGATIVE    Bilirubin Urine NEGATIVE  NEGATIVE    Ketones, ur NEGATIVE  NEGATIVE (mg/dL)   Protein, ur NEGATIVE  NEGATIVE (mg/dL)   Urobilinogen, UA 0.2  0.0 - 1.0 (mg/dL)   Nitrite NEGATIVE  NEGATIVE    Leukocytes, UA NEGATIVE  NEGATIVE   LIPASE, BLOOD      Component Value Range   Lipase 22  11 - 59 (U/L)   No diagnosis found.  Routine labs, CT abdomen and pelvis to rule out inflammatory process, IV Zofran and protonic. MDM  Recheck prior to discharge. Feeling much better. His nausea vomiting and diarrhea. Increased pain. Dust elevated liver functions and liver on CT scan. Followup with Dr. Darrick Penna local gastroenterologist        Donnetta Hutching, MD 01/30/11 1626  Donnetta Hutching, MD 01/30/11 1630

## 2011-01-30 NOTE — ED Notes (Signed)
N/v/d x 6 days. abd and all over cramping x 5 days ago.  Mm dry. Alert orietned. No weakness observed at this time.

## 2011-02-01 ENCOUNTER — Other Ambulatory Visit: Payer: Self-pay

## 2011-02-01 ENCOUNTER — Encounter: Payer: Self-pay | Admitting: Gastroenterology

## 2011-02-01 ENCOUNTER — Ambulatory Visit (INDEPENDENT_AMBULATORY_CARE_PROVIDER_SITE_OTHER): Payer: BC Managed Care – PPO | Admitting: Gastroenterology

## 2011-02-01 VITALS — BP 145/90 | HR 96 | Temp 97.6°F | Ht 69.0 in | Wt 268.8 lb

## 2011-02-01 DIAGNOSIS — R1110 Vomiting, unspecified: Secondary | ICD-10-CM

## 2011-02-01 DIAGNOSIS — R197 Diarrhea, unspecified: Secondary | ICD-10-CM

## 2011-02-01 DIAGNOSIS — K760 Fatty (change of) liver, not elsewhere classified: Secondary | ICD-10-CM

## 2011-02-01 DIAGNOSIS — R7402 Elevation of levels of lactic acid dehydrogenase (LDH): Secondary | ICD-10-CM

## 2011-02-01 NOTE — Progress Notes (Signed)
Referring Provider: Ardyth Gal, MD Primary Care Physician:  Harlow Asa, MD, MD Primary Gastroenterologist: Dr. Darrick Penna  Chief Complaint  Patient presents with  . Follow-up    from ER/somw what better but tried now    HPI:   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 EGD/colonoscopy done in 2010. Reports recent acute illness of N/V/D. Was on cruise ship, [place], [place], never went off boat. had N/V/D. A week ago, Monday morning. Got up around noon Tuesday, ate lunch in dining room. Around 4pm, acutely ill.  Went to [employer] on on cruise ship, got IVFs, medication. Quarantined. By Friday felt better. Saturday night started all over again. Went to ED Monday morning. Small amount of diarrhea yesterday morning.  No diarrhea today. No further N/V. Feel whipped.   Trying to cut down on alcohol. Went to counseling for ETOH abuse for 5 visits. ETOH a few times per week, not daily. About a 6 pack at a time.   No difficulties with GERD, on Prevacid 15 mg.  Feels like food sometimes gets stuck, points at throat. No bottom teeth. Not chewing well.   Feels like these episodes happen once every 6 mos, N/V/D.  Eats a lot of cheese.   Weighed 257 in Aug 2011. Now up 11 lbs.   In ED, CBC normal but slightly elevated WBC (likely reactive), Lipase normal, AST 112, ALT 261.  Hep B, C negative in 2009  Back in Aug 2011: AST 184, ALT 248  CT Oct 2012: Mild hepatic steatosis. Hepatomegaly, 20 cm cranial caudal. No focal liver lesion.   Past Medical History  Diagnosis Date  . Hypertension   . Depression   . Acid reflux   . Hyperlipidemia   . Insomnia   . Hepatitis     non-viral, r/t ETOH/obesity    Past Surgical History  Procedure Date  . Knee surgery   . Colonoscopy May 2010    Dr. Darrick Penna: simple adenoma, normal colon. Repeat 2020  . Upper gastrointestinal endoscopy May 2010    Dr. Darrick Penna: normal    Current Outpatient  Prescriptions  Medication Sig Dispense Refill  . aspirin 81 MG chewable tablet Chew 81 mg by mouth daily.        . diphenoxylate-atropine (LOMOTIL) 2.5-0.025 MG per tablet Take 1 tablet by mouth 3 (three) times daily as needed. diarrhea      . enalapril (VASOTEC) 20 MG tablet Take 20 mg by mouth 2 (two) times daily.        . hydrochlorothiazide (HYDRODIURIL) 25 MG tablet Take 25 mg by mouth every morning.        Marland Kitchen HYDROcodone-acetaminophen (NORCO) 5-325 MG per tablet Take 1-2 tablets by mouth every 4 (four) hours as needed for pain.  20 tablet  0  . lansoprazole (PREVACID) 15 MG capsule Take 15 mg by mouth every morning.        Marland Kitchen LORazepam (ATIVAN) 1 MG tablet Take 2.5 mg by mouth at bedtime.        . ondansetron (ZOFRAN) 4 MG tablet Take 1 tablet (4 mg total) by mouth every 6 (six) hours.  15 tablet  0  . venlafaxine (EFFEXOR) 75 MG tablet Take 75 mg by mouth 2 (two) times daily.        . verapamil (CALAN-SR) 240 MG CR tablet Take 240 mg by mouth at bedtime.        . diphenoxylate-atropine (LOMOTIL) 2.5-0.025 MG  per tablet Take 1 tablet by mouth 4 (four) times daily as needed for diarrhea/loose stools.  30 tablet  0  . LORazepam (ATIVAN) 2 MG tablet         Allergies as of 02/01/2011 - Review Complete 02/01/2011  Allergen Reaction Noted  . Daypro (oxaprozin)    . Levaquin  01/30/2011    Family History  Problem Relation Age of Onset  . Colon cancer Neg Hx     History   Social History  . Marital Status: Married    Spouse Name: N/A    Number of Children: N/A  . Years of Education: N/A   Social History Main Topics  . Smoking status: Former Games developer  . Smokeless tobacco: None  . Alcohol Use: Yes     couple days a week  . Drug Use: No  . Sexually Active:    Other Topics Concern  . None   Social History Narrative  . None    Review of Systems: Gen: Denies fever, chills, anorexia. Denies fatigue, weakness, weight loss.  CV: Denies chest pain, palpitations, syncope,  peripheral edema, and claudication. Resp: Denies dyspnea at rest, cough, wheezing, coughing up blood, and pleurisy. GI: Denies vomiting blood, jaundice, and fecal incontinence.   Denies odynophagia. Vague dysphagia, ?not chewing well Derm: Denies rash, itching, dry skin Psych: Denies depression, anxiety, memory loss, confusion. No homicidal or suicidal ideation.  Heme: Denies bruising, bleeding, and enlarged lymph nodes.  Physical Exam: BP 145/90  Pulse 96  Temp(Src) 97.6 F (36.4 C) (Temporal)  Ht 5\' 9"  (1.753 m)  Wt 268 lb 12.8 oz (121.927 kg)  BMI 39.69 kg/m2 General:   Alert and oriented. No distress noted. Pleasant and cooperative.  Head:  Normocephalic and atraumatic. Eyes:  Conjuctiva clear without scleral icterus. Mouth:  Oral mucosa pink and moist. No bottom teeth.  Neck:  Supple, without mass or thyromegaly. Heart:  S1, S2 present without murmurs, rubs, or gallops. Regular rate and rhythm. Abdomen:  +BS, soft, largely obese, non-tender and non-distended. No rebound or guarding. No HSM noted. Probably umbilical hernia. Msk:  Symmetrical without gross deformities. Normal posture. Extremities:  Without edema. Neurologic:  Alert and  oriented x4;  grossly normal neurologically. Skin:  Intact without significant lesions or rashes. Cervical Nodes:  No significant cervical adenopathy. Psych:  Alert and cooperative. Normal mood and affect.

## 2011-02-01 NOTE — Patient Instructions (Addendum)
Add a probiotic daily.  Continue Prevacid daily.   If you have any further episodes of diarrhea, nausea or vomiting, please contact our office immediately.  Please see the low-fat diet handout. This is important for weight loss and to help your liver.  In 6 weeks, please complete labs to recheck your liver. We will call you with those results.  Recommend 1-2# weight loss per week until ideal body weight through exercise & diet. Low fat/cholesterol diet. Gradually increase exercise from 15 min daily up to 1 hr per day 5 days/week. Limit alcohol use.

## 2011-02-02 ENCOUNTER — Encounter: Payer: Self-pay | Admitting: Gastroenterology

## 2011-02-02 NOTE — Assessment & Plan Note (Addendum)
N/V/D while on cruise ship. Likely acute illness, now resolved. If diarrhea returns, contact office for stool studies. Advised to try different PPI; he would rather stick with the Prevacid OTC. Probiotic daily.   6 mos f/u

## 2011-02-02 NOTE — Assessment & Plan Note (Addendum)
50 year old Caucasian male with chronic elevated transaminases, since 2008/2009. Recent AST 112, ALT 261, constistent/similar with prior labs from Aug 2011. This is in the setting of increased wt (11 lbs), recent probably gastroenteritis, self-limiting. Pt also continues to drink alcohol but is trying to cut back. CT findings as describe above. Concerning for continued wt gain, ETOH intake, with likely end point of cirrhosis. Discussed at length diet, exercise, behavioral changes. Will need to repeat LFTs in 6 weeks. Consider further work-up if remain elevated. Viral markers negative in 2009.

## 2011-02-02 NOTE — Progress Notes (Signed)
Cc to PCP 

## 2011-03-23 NOTE — Progress Notes (Signed)
WOULD REPEAT HEP ABC SEROLOGIES, & CHECK FOR AIH, AND FASTING FERRITIN/IRON SAT. NO ADDITIONAL WORKUP UNLESS ENZYMES ELEVATED AND PT LOSING WEIGHT AND ABSTAINING FROM ETOH. OPV W/ SLF IN 4 MOS E 30 VISIT, Dx: elevated liver enzymes, ETOH abuse, severe obesity

## 2011-04-20 ENCOUNTER — Encounter: Payer: Self-pay | Admitting: Pulmonary Disease

## 2011-04-20 ENCOUNTER — Ambulatory Visit (INDEPENDENT_AMBULATORY_CARE_PROVIDER_SITE_OTHER): Payer: BC Managed Care – PPO | Admitting: Pulmonary Disease

## 2011-04-20 DIAGNOSIS — G4733 Obstructive sleep apnea (adult) (pediatric): Secondary | ICD-10-CM

## 2011-04-20 DIAGNOSIS — G4700 Insomnia, unspecified: Secondary | ICD-10-CM

## 2011-04-20 NOTE — Patient Instructions (Signed)
You have insomnia & may have obstructive sleep apnea triaL of lunesta 3 mg at bedtime - decrease ativan to 1mg  when you take this Sleep study -at [facility]  (preferred) or [facility]

## 2011-04-20 NOTE — Assessment & Plan Note (Addendum)
Chronic - of sleep onset & maintenance On Lorazepam x 2 yrs - likely with decreased effect now. Will trial lunesta 3 mg & if this works - he will taper lorazepam by 1 mg every 15 days to off. Trazodone would be a good choice but he reports some problems in the past -he is not very clear about this & perhaps dr Gerda Diss can confirm Discussed simple rules of cognitive behaviour therapy - Rules of sleep hygiene were discussed  - light exercise -avoid caffeinated beverages - no more than 20 mins staying awake in bed, if not asleep, get out of bed & reading or light music - No TV or computer games at bedtime.

## 2011-04-20 NOTE — Assessment & Plan Note (Signed)
Given obesity, narrow pharyngeal exam & loud snoring, obstructive sleep apnea is very likely & an overnight polysomnogram will be scheduled as a split study in thelab due to co-morbid insomnia. The pathophysiology of obstructive sleep apnea , it's cardiovascular consequences & modes of treatment including CPAP were discused with the patient in detail & they evidenced understanding.

## 2011-04-20 NOTE — Progress Notes (Signed)
  Subjective:    Patient ID: Ryan Hickman, male    DOB: Dec 03, 1960, 50 y.o.   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  History is self reported  - no bed partner history available. For 8 yrs, since he quit smoking, he has difficulty sleeping. He has prolonged sleep latency & also inability to maintain sleep - wakes up every 2 h. He has tried [place] (no effect), restoril (some benefit),? Trazodone (side effects) & has been taking lorazepam 3 mg since 2010. He is also on prozac daytime for  Depression.  - effexor made him irritable. He reports vivid dreams that bring back memories of childhood, but do not seem to be traumatic events. Bedtime is 8pm, latency 1-2 h, sleeps on his side with 1 pilllow, 5-10 awakenings including nocturia, snoring has occasionally wooken himself up, oob at 0600 feeling tired, no dryness or headaches. He has gained 30 lbs in the last 2 yrs. He drinks a cup of coffe daily & beer on weekends, no caffeinated drinks. ESS 3/24 Denies drug use, leg movements, no witnessed apneas. He has chronic reflux but this does not wake him up at night. 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  HENT: Negative for ear pain, congestion, sore throat, rhinorrhea, sneezing, trouble swallowing, dental problem and postnasal drip.   Eyes: Negative for redness.  Respiratory: Negative for cough, shortness of breath and wheezing.   Cardiovascular: Negative for chest pain, palpitations and leg swelling.  Gastrointestinal: Negative for nausea, vomiting, abdominal pain and diarrhea.  Genitourinary: Negative for dysuria and urgency.  Musculoskeletal: Negative for joint swelling.  Skin: Negative for rash.  Neurological: Negative for syncope  and headaches.  Hematological: Does not bruise/bleed easily.  Psychiatric/Behavioral: Positive for dysphoric mood. The patient is not nervous/anxious.        Objective:   Physical Exam  Gen. Pleasant, obese, in no distress, normal affect ENT - no lesions, no post nasal drip, class 2-3 airway Neck: No JVD, no thyromegaly, no carotid bruits Lungs: no use of accessory muscles, no dullness to percussion, decreased without rales or rhonchi  Cardiovascular: Rhythm regular, heart sounds  normal, no murmurs or gallops, no peripheral edema Abdomen: soft and non-tender, no hepatosplenomegaly, BS normal. Musculoskeletal: No deformities, no cyanosis or clubbing Neuro:  alert, non focal, no tremors       Assessment & Plan:

## 2011-04-21 ENCOUNTER — Ambulatory Visit: Payer: BC Managed Care – PPO | Attending: Pulmonary Disease | Admitting: Sleep Medicine

## 2011-04-21 VITALS — BP 124/72 | HR 84 | Resp 18 | Ht 69.0 in | Wt 270.0 lb

## 2011-04-21 DIAGNOSIS — Z6841 Body Mass Index (BMI) 40.0 and over, adult: Secondary | ICD-10-CM | POA: Insufficient documentation

## 2011-04-21 DIAGNOSIS — G4733 Obstructive sleep apnea (adult) (pediatric): Secondary | ICD-10-CM | POA: Insufficient documentation

## 2011-04-24 ENCOUNTER — Telehealth: Payer: Self-pay | Admitting: Pulmonary Disease

## 2011-04-24 NOTE — Telephone Encounter (Signed)
Pt just had sleep study on 04/21/11. Unable to get sleep lab on phone to verify if study has been read. I don't believe we will be able to get cpap set up today. DME company will have to check for pre cert on cpap and usually it takes 3-4 days to hear back from insurance on benefits. I spoke with patient and he is aware that CPAP will not be set up today. Pt is aware that triage will be calling to address the issue of his medication.

## 2011-04-24 NOTE — Telephone Encounter (Signed)
I spoke with pt and he states that he started the lunesta 3 mg and decreased the ativan down while taking this and felt like it helped on the first 2 nights. Pt states now it is taking him 2-3 hrs to fall asleep. Pt is requesting recs from Dr. Vassie Loll. Please advise, thanks  Allergies  Allergen Reactions  . Daypro (Oxaprozin)     REACTION: Caused by anaphylaxis  . Levaquin     hives

## 2011-04-27 NOTE — Telephone Encounter (Signed)
Pt would like to speak w/ someone today regarding the sleeping pills he was given as a sample.  Pt requested to be reached at [phone].  Antionette Fairy

## 2011-04-27 NOTE — Telephone Encounter (Signed)
Pl have sleep lab fax study

## 2011-04-27 NOTE — Telephone Encounter (Signed)
Pt aware we are waiting on recs from Dr. Vassie Loll and will call him back later today.

## 2011-04-28 ENCOUNTER — Telehealth: Payer: Self-pay | Admitting: Pulmonary Disease

## 2011-04-28 DIAGNOSIS — G4733 Obstructive sleep apnea (adult) (pediatric): Secondary | ICD-10-CM

## 2011-04-28 NOTE — Telephone Encounter (Signed)
PSG showed moderate degree of obstructive sleep apnea - stopped breathing 26/h, corrected by CPAP 12 cm , standard fisher paykel mask - Rx sent to DME

## 2011-04-28 NOTE — Telephone Encounter (Signed)
Ok to [employer] 3 mg x 30  Decrase ativan to 2 mg x 15 days , then 1 mg x 1 5 days FU in 30 ds

## 2011-04-28 NOTE — Telephone Encounter (Signed)
Per [name] pt is still wanting to about his sleep pills. Please advise Dr. Vassie Loll. thanks

## 2011-05-01 MED ORDER — ESZOPICLONE 3 MG PO TABS: 3.0000 mg | ORAL_TABLET | Freq: Every evening | ORAL | Status: DC | PRN

## 2011-05-01 NOTE — Telephone Encounter (Signed)
I spoke with pt and advised him of RA recs. He voiced his understanding and rx was called into [facility] per pt request. Pt is scheduled to come in and see RA on 05/23/11 at 4:15

## 2011-05-02 ENCOUNTER — Telehealth: Payer: Self-pay | Admitting: Pulmonary Disease

## 2011-05-02 NOTE — Telephone Encounter (Signed)
Done at [facility]

## 2011-05-02 NOTE — Telephone Encounter (Signed)
Pl find out from his pharmacy what meds are covered

## 2011-05-02 NOTE — Telephone Encounter (Signed)
I informed pt of RA's findings and recommendations. Pt verbalized understanding  [facility] states PSG is needed not just the titration. Dr. Vassie Loll do you have this?

## 2011-05-02 NOTE — Telephone Encounter (Signed)
Lunesta 3mg  is not covered by pt's insurance. I called pharmacy and they advised no alternatives given. Pt states he has tried and failed Ambien and Trazodone in the past. Dr. Vassie Loll please advise an alternative, thanks.

## 2011-05-03 NOTE — Telephone Encounter (Signed)
Pl have pt call his insurance then & get me names of some alternatives. All the above meds have not worked for him ,he said

## 2011-05-03 NOTE — Telephone Encounter (Signed)
Dr. Vassie Loll, the pharmacy does not know. Do you want to just try another medication and see if it is covered? Pt states multiple medications were discussed during OV that pt has tried in the past that has not worked such as Trazodone and Ambien. Please advise, thanks.

## 2011-05-04 ENCOUNTER — Ambulatory Visit: Payer: BC Managed Care – PPO | Admitting: Gastroenterology

## 2011-05-04 MED ORDER — TEMAZEPAM 15 MG PO CAPS: ORAL_CAPSULE | ORAL | Status: DC

## 2011-05-04 NOTE — Telephone Encounter (Signed)
Called and spoke with pt.  Left him 1 weeks worth of lunesta 3mg  samples.  Pt is aware they will be at the front desk for him to pick up.  Also informed him rx for temazepam sent to pharmacy for him to try.  Pt was ok with this.

## 2011-05-04 NOTE — Telephone Encounter (Signed)
Pt has returned triage's call.  Ryan Hickman ° °

## 2011-05-04 NOTE — Telephone Encounter (Signed)
Ok to give hims samples of lunesta x 1 week Please send Rx for Temazepam 15 mg , take 1-2 tabs qhs prn insomnia x 30

## 2011-05-04 NOTE — Telephone Encounter (Signed)
Pt has returned a phone Mindy's call and his "drug people" told him they would cover  temazepam.  Pt can be reached at [phone].  Antionette Fairy

## 2011-05-04 NOTE — Telephone Encounter (Signed)
RA, pt called back stating his ins informed him they would cover Temazepam.  Please advise.  Thanks!Marland Kitchen

## 2011-05-04 NOTE — Procedures (Signed)
[name], Hickman                ACCOUNT NO.:  [id number]  MEDICAL RECORD NO.:  [id number]          PATIENT TYPE:  OUT  LOCATION:  SLEEP CENTER                 FACILITY:  [facility]  PHYSICIAN:  Oretha Milch, MD      DATE OF BIRTH:  1961/04/22  DATE OF STUDY:  04/21/2011                           NOCTURNAL POLYSOMNOGRAM  REFERRING PHYSICIAN:  Laural Golden  INDICATION FOR STUDY:  Loud snoring, frequent awakenings, and non- refreshing sleep in this obese gentleman with hypertension and depression.  At the time of this study, he weighed 270 pounds with a height of 5 feet 9 inches, BMI of 40, and neck size of 20 inches.  EPWORTH SLEEPINESS SCORE:  2.  MEDICATIONS:  Fluoxetine, enalapril, hydrochlorothiazide, Prilosec, lorazepam, aspirin, verapamil.  His longstanding insomnia maintained on 3 mg of lorazepam at bedtime.  This intervention polysomnogram was performed with a sleep technologist in attendance, EEG, EOG, EMG, EKG, and respiratory parameters were recorded.  Sleep stages, arousals, limb movements, and respiratory data were scored according to criteria laid out by the American Academy of Sleep Medicine.  SLEEP ARCHITECTURE:  Lights out was at 9:33 p.m., lights on was at 4:40 a.m.  During the baseline portion, total sleep time was 130 minutes with a sleep period time of 259 minutes.  Sleep stages as the percentage of total sleep time was N1 19%, N2 58%, REM sleep 16% (43 minutes), and 6% slow wave sleep.  Supine REM was noted for 18 minutes.  REM sleep was noted around 3:30 a.m.  RESPIRATORY DATA:  During the baseline portion, there were total of 3 obstructive apneas, 1 central apnea, 0 mixed apnea, and 52 hypopneas with apnea/hypopnea index of 28 events/hour and the lowest desaturation of 84%.  Due to this degree of respiratory disturbance, CPAP was initiated at 5 cm and titrated to a final pressure of 12 cm.  At this level for 56 minutes of sleep including 39  minutes of REM sleep and 14 minutes of REM supine sleep, no events or desaturations were noted. This appears to be the optimal pressure used during the study.  AROUSAL DATA:  There were a total of 39 arousals with an arousal index of 9 events per hour.  Of these, 15 were spontaneous, the rest were associated with respiratory events.  OXYGEN DATA:  The desaturation index was 17 events per hour.  The lowest desaturation was 82%.  He spent 1.6 minutes with a saturation less than 88%.  CARDIAC DATA:  No arrhythmias were noted.  The low heart rate recorded was 37 beats per minute.  The high heart rate recorded was an artifact.  MOVEMENT-PRASOMNIA:  There were 160 periodic limb movements with a PLM index of 36 events per hour, however, only 3 of these were associated with arousals with a PLM arousal index of 0.7 events per hour.  DISCUSSION:  He was desensitized with a standard Fisher and Paykel mask. He met nice split night protocol due to an AHI of 25.7 events per hour.  IMPRESSION-RECOMMENDATIONS: 1. Moderate obstructive sleep apnea with predominant hypopneas causing     sleep fragmentation and oxygen desaturation. 2. This seems to be  corrected by a CPAP of 12 cm with a full-face     mask.  Titration was optimal. 3. Periodic limb movements were noted, but not associated with     arousals.  The significance of this is unclear. 4. No evidence of cardiac arrhythmias or behavioral disturbance during     sleep.  Recommendations: 1. The treatment options for this degree of sleep disordered breathing     include weight loss and CPAP therapy. 2. CPAP can be initiated with the Fisher and Paykel mask at a pressure     of 12 cm with heated humidity. 3. Compliance should be monitored at this level. 4. He should be cautioned against medications and sedative side     effects.  He should be advised against driving when sleepy.     Oretha Milch, MD    RVA/MEDQ  D:  05/03/2011 17:04:27   T:  05/04/2011 07:21:33  Job:  [id number]

## 2011-05-04 NOTE — Telephone Encounter (Signed)
I spoke with pt and advised him to call his insurance to provide [place] alternatives to what they will cover. Pt states he will see what he can do. Pt is requesting samples of lunesta in the meantime. Please advise Dr. Vassie Loll, thanks

## 2011-05-08 DIAGNOSIS — G4710 Hypersomnia, unspecified: Secondary | ICD-10-CM

## 2011-05-08 DIAGNOSIS — G4730 Sleep apnea, unspecified: Secondary | ICD-10-CM

## 2011-05-11 ENCOUNTER — Telehealth: Payer: Self-pay | Admitting: Pulmonary Disease

## 2011-05-11 NOTE — Telephone Encounter (Signed)
Called and spoke with Tammy from [place].  She states she needs a copy of pt's sleep study report from 04/21/11 for insurance purposes since pt has a cpap machine.  Printed off copy of sleep study report and faxed it to tammy at [phone]

## 2011-05-17 NOTE — Telephone Encounter (Signed)
See 05/11/11 phone note. Study faxed to Tammy with pt's DME company.

## 2011-05-23 ENCOUNTER — Telehealth: Payer: Self-pay | Admitting: Pulmonary Disease

## 2011-05-23 ENCOUNTER — Ambulatory Visit (INDEPENDENT_AMBULATORY_CARE_PROVIDER_SITE_OTHER): Payer: BC Managed Care – PPO | Admitting: Pulmonary Disease

## 2011-05-23 ENCOUNTER — Encounter: Payer: Self-pay | Admitting: Pulmonary Disease

## 2011-05-23 VITALS — BP 126/74 | HR 81 | Temp 98.1°F | Ht 70.0 in | Wt 270.8 lb

## 2011-05-23 DIAGNOSIS — G4733 Obstructive sleep apnea (adult) (pediatric): Secondary | ICD-10-CM

## 2011-05-23 DIAGNOSIS — G4700 Insomnia, unspecified: Secondary | ICD-10-CM

## 2011-05-23 MED ORDER — TRAZODONE HCL 100 MG PO TABS: 100.0000 mg | ORAL_TABLET | Freq: Every day | ORAL | Status: DC

## 2011-05-23 NOTE — Progress Notes (Signed)
  Subjective:    Patient ID: Ryan Hickman, male    DOB: [birth year], 51 y.o.   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 He has prolonged sleep latency & also inability to maintain sleep - wakes up every 2 h. He has tried [place] (no effect), restoril (some benefit),? Trazodone (side effects) & has been taking lorazepam 3 mg since 2010. He is also on prozac daytime for Depression. - effexor made him irritable. He reports vivid dreams that bring back memories of childhood, but do not seem to be traumatic events.  Bedtime is 8pm, latency 1-2 h, sleeps on his side with 1 pilllow, 5-10 awakenings including nocturia, snoring has occasionally woken himself up, oob at 0600 feeling tired, no dryness or headaches. He has gained 30 lbs in the last 2 yrs. He drinks a cup of coffe daily & beer on weekends, no caffeinated drinks.  ESS 3/24  Denies drug use, leg movements, no witnessed apneas. He has chronic reflux but this does not wake him up at night. 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  Temazepam 30 mg did not work as Hydrologist to retry trazodone    Review of Systems Patient denies significant dyspnea,cough, hemoptysis,  chest pain, palpitations, pedal edema, orthopnea, paroxysmal nocturnal dyspnea, lightheadedness, nausea, vomiting, abdominal or  leg pains      Objective:   Physical Exam  Gen. Pleasant, obese, in no distress ENT - no lesions, no post nasal drip Neck: No JVD, no thyromegaly, no carotid bruits Lungs: no use of accessory muscles, no dullness to percussion, decreased without rales or rhonchi  Cardiovascular: Rhythm regular, heart  sounds  normal, no murmurs or gallops, no peripheral edema Musculoskeletal: No deformities, no cyanosis or clubbing , no tremors       Assessment & Plan:

## 2011-05-23 NOTE — Telephone Encounter (Signed)
Pl find out what meds are covered by his insurance - [phone] -lunesta, sonata, rozerem - priro auth ok, he has failed other meds When I called it asks for his member ID # (which I did not have)

## 2011-05-23 NOTE — Assessment & Plan Note (Signed)
Ct CPAP 12 cm  Weight loss encouraged, compliance with goal of at least 4-6 hrs every night is the expectation. Advised against medications with sedative side effects Cautioned against driving when sleepy - understanding that sleepiness will vary on a day to day basis  

## 2011-05-23 NOTE — Assessment & Plan Note (Signed)
Chronic - of sleep onset & maintenance On Lorazepam x 2 yrs - likely with decreased effect now. Trazodone would be a good choice but he reports some problems in the past >> start with 100 mg & gradually increase to 200 mg q hs Plan to taper ativan to 2 mg now , then by 1 mg every 2 weeks if alternative agent is found Will try to get lunesta approved by his insurance

## 2011-05-23 NOTE — Patient Instructions (Signed)
Stay on 2 mg ativan at bedtime Start trazodone 100 mg at bedtime If not effective, increase every 3 rd night by 50 mg (1/2 tab) to maximum of 200 mg  Meantime, I will call your insurance & try to find alternatives

## 2011-06-07 ENCOUNTER — Telehealth: Payer: Self-pay | Admitting: Pulmonary Disease

## 2011-06-07 NOTE — Telephone Encounter (Signed)
Download 1/9-06/01/11 >> good control of events, I am glad he is able to use CPAP better-avg 6h

## 2011-06-08 ENCOUNTER — Emergency Department (HOSPITAL_COMMUNITY)
Admission: EM | Admit: 2011-06-08 | Discharge: 2011-06-08 | Disposition: A | Discharge status: Home / Self Care | Payer: BC Managed Care – PPO | Attending: Emergency Medicine | Admitting: Emergency Medicine

## 2011-06-08 ENCOUNTER — Encounter (HOSPITAL_COMMUNITY): Payer: Self-pay | Admitting: Emergency Medicine

## 2011-06-08 DIAGNOSIS — R52 Pain, unspecified: Secondary | ICD-10-CM | POA: Insufficient documentation

## 2011-06-08 DIAGNOSIS — I10 Essential (primary) hypertension: Secondary | ICD-10-CM | POA: Insufficient documentation

## 2011-06-08 DIAGNOSIS — Z7982 Long term (current) use of aspirin: Secondary | ICD-10-CM | POA: Insufficient documentation

## 2011-06-08 DIAGNOSIS — B9789 Other viral agents as the cause of diseases classified elsewhere: Secondary | ICD-10-CM | POA: Insufficient documentation

## 2011-06-08 DIAGNOSIS — IMO0001 Reserved for inherently not codable concepts without codable children: Secondary | ICD-10-CM | POA: Insufficient documentation

## 2011-06-08 DIAGNOSIS — K219 Gastro-esophageal reflux disease without esophagitis: Secondary | ICD-10-CM | POA: Insufficient documentation

## 2011-06-08 DIAGNOSIS — H9209 Otalgia, unspecified ear: Secondary | ICD-10-CM | POA: Insufficient documentation

## 2011-06-08 DIAGNOSIS — Z79899 Other long term (current) drug therapy: Secondary | ICD-10-CM | POA: Insufficient documentation

## 2011-06-08 DIAGNOSIS — R112 Nausea with vomiting, unspecified: Secondary | ICD-10-CM | POA: Insufficient documentation

## 2011-06-08 DIAGNOSIS — R51 Headache: Secondary | ICD-10-CM | POA: Insufficient documentation

## 2011-06-08 DIAGNOSIS — B349 Viral infection, unspecified: Secondary | ICD-10-CM

## 2011-06-08 DIAGNOSIS — E785 Hyperlipidemia, unspecified: Secondary | ICD-10-CM | POA: Insufficient documentation

## 2011-06-08 DIAGNOSIS — R5381 Other malaise: Secondary | ICD-10-CM | POA: Insufficient documentation

## 2011-06-08 MED ORDER — ONDANSETRON 8 MG PO TBDP
8.0000 mg | ORAL_TABLET | Freq: Once | ORAL | Status: AC
  Administered 2011-06-08: 8 mg via ORAL
  Filled 2011-06-08: qty 1

## 2011-06-08 MED ORDER — IBUPROFEN 800 MG PO TABS
800.0000 mg | ORAL_TABLET | Freq: Once | ORAL | Status: AC
  Administered 2011-06-08: 800 mg via ORAL
  Filled 2011-06-08: qty 1

## 2011-06-08 MED ORDER — PROMETHAZINE HCL 25 MG PO TABS: 25.0000 mg | ORAL_TABLET | Freq: Four times a day (QID) | ORAL | Status: DC | PRN

## 2011-06-08 MED ORDER — NAPROXEN 500 MG PO TABS: 500.0000 mg | ORAL_TABLET | Freq: Two times a day (BID) | ORAL | Status: DC

## 2011-06-08 NOTE — Discharge Instructions (Signed)
Viral Infections A viral infection can be caused by different types of viruses.Most viral infections are not serious and resolve on their own. However, some infections may cause severe symptoms and may lead to further complications. SYMPTOMS Viruses can frequently cause:  Minor sore throat.   Aches and pains.   Headaches.   Runny nose.   Different types of rashes.   Watery eyes.   Tiredness.   Cough.   Loss of appetite.   Gastrointestinal infections, resulting in nausea, vomiting, and diarrhea.  These symptoms do not respond to antibiotics because the infection is not caused by bacteria. However, you might catch a bacterial infection following the viral infection. This is sometimes called a "superinfection." Symptoms of such a bacterial infection may include:  Worsening sore throat with pus and difficulty swallowing.   Swollen neck glands.   Chills and a high or persistent fever.   Severe headache.   Tenderness over the sinuses.   Persistent overall ill feeling (malaise), muscle aches, and tiredness (fatigue).   Persistent cough.   Yellow, green, or brown mucus production with coughing.  HOME CARE INSTRUCTIONS   Only take over-the-counter or prescription medicines for pain, discomfort, diarrhea, or fever as directed by your caregiver.   Drink enough water and fluids to keep your urine clear or pale yellow. Sports drinks can provide valuable electrolytes, sugars, and hydration.   Get plenty of rest and maintain proper nutrition. Soups and broths with crackers or rice are fine.  SEEK IMMEDIATE MEDICAL CARE IF:   You have severe headaches, shortness of breath, chest pain, neck pain, or an unusual rash.   You have uncontrolled vomiting, diarrhea, or you are unable to keep down fluids.   You or your child has an oral temperature above 102 F (38.9 C), not controlled by medicine.   Your baby is older than 3 months with a rectal temperature of 102 F (38.9 C) or  higher.   Your baby is 3 months old or younger with a rectal temperature of 100.4 F (38 C) or higher.  MAKE SURE YOU:   Understand these instructions.   Will watch your condition.   Will get help right away if you are not doing well or get worse.  Document Released: 01/18/2005 Document Revised: 12/21/2010 Document Reviewed: 08/15/2010 Exit[employer] Patient Information 2012 [employer]. 

## 2011-06-08 NOTE — Telephone Encounter (Signed)
I informed pt of RA's findings and recommendations. Pt verbalized understanding  

## 2011-06-08 NOTE — Telephone Encounter (Signed)
Pt aware.

## 2011-06-08 NOTE — ED Notes (Signed)
Patient with c/o headache, generalized body aches and nausea that started this morning. Also c/o left ear pain.

## 2011-06-08 NOTE — Telephone Encounter (Signed)
Called and got PA for Lunesta 3mg  approved. Case #[id number] dates May 18, 2011-Jun 07, 2012. Called and left message on pt's vmail to return call. Pharmacy has been informed.

## 2011-06-08 NOTE — ED Provider Notes (Signed)
History     CSN: [id number]  Arrival date & time 06/08/11  0707   First MD Initiated Contact with Patient 06/08/11 [phone]      Chief Complaint  Patient presents with  . Headache  . Generalized Body Aches  . Nausea    (Consider location/radiation/quality/duration/timing/severity/associated sxs/prior treatment) Patient is a 51 y.o. male presenting with headaches. The history is provided by the patient. No language interpreter was used.  Headache  This is a new problem. The current episode started 3 to 5 hours ago. The problem occurs constantly. The problem has been gradually worsening. The headache is associated with nothing. The pain is located in the frontal region. The quality of the pain is described as throbbing. The pain is mild. The pain does not radiate. Associated symptoms include malaise/fatigue, nausea and vomiting. Pertinent negatives include no anorexia, no fever, no chest pressure, no near-syncope, no orthopnea, no palpitations, no syncope and no shortness of breath. He has tried nothing for the symptoms. The treatment provided no relief.    Past Medical History  Diagnosis Date  . Hypertension   . Depression   . Acid reflux   . Hyperlipidemia   . Insomnia   . Hepatitis     non-viral, r/t ETOH/obesity  . Childhood asthma     Past Surgical History  Procedure Date  . Knee surgery 1992  . Colonoscopy May 2010    Dr. Darrick Penna: simple adenoma, normal colon. Repeat 2020  . Upper gastrointestinal endoscopy May 2010    Dr. Darrick Penna: normal    Family History  Problem Relation Age of Onset  . Colon cancer Neg Hx     History  Substance Use Topics  . Smoking status: Former Smoker -- 3.0 packs/day for 25 years    Types: Cigarettes    Quit date: 01/19/2003  . Smokeless tobacco: Never Used  . Alcohol Use: Yes     couple days a week      Review of Systems  Constitutional: Positive for malaise/fatigue and fatigue. Negative for fever, chills, activity change and  appetite change.  HENT: Positive for ear pain. Negative for congestion, sore throat, rhinorrhea, neck pain and neck stiffness.   Respiratory: Negative for cough and shortness of breath.   Cardiovascular: Negative for chest pain, palpitations, orthopnea, syncope and near-syncope.  Gastrointestinal: Positive for nausea and vomiting. Negative for abdominal pain, diarrhea, constipation and anorexia.  Genitourinary: Negative for dysuria, urgency, frequency and flank pain.  Musculoskeletal: Positive for myalgias. Negative for back pain and arthralgias.  Neurological: Positive for headaches. Negative for dizziness, weakness, light-headedness and numbness.  All other systems reviewed and are negative.    Allergies  Daypro and Levaquin  Home Medications   Current Outpatient Rx  Name Route Sig Dispense Refill  . ASPIRIN 81 MG PO CHEW Oral Chew 81 mg by mouth daily.      Marland Kitchen DIPHENOXYLATE-ATROPINE 2.5-0.025 MG PO TABS Oral Take 1 tablet by mouth 3 (three) times daily as needed. diarrhea    . ENALAPRIL MALEATE 20 MG PO TABS Oral Take 20 mg by mouth 2 (two) times daily.      Marland Kitchen FLUOXETINE HCL 20 MG PO CAPS Oral Take 1 tablet by mouth daily.    Marland Kitchen HYDROCHLOROTHIAZIDE 25 MG PO TABS Oral Take 25 mg by mouth every morning.      Marland Kitchen LANSOPRAZOLE 15 MG PO CPDR Oral Take 15 mg by mouth every morning.      Marland Kitchen LEVITRA 20 MG PO TABS  as  needed.    Marland Kitchen LORAZEPAM 2 MG PO TABS  2 mg.     Marland Kitchen NAPROXEN 500 MG PO TABS Oral Take 1 tablet (500 mg total) by mouth 2 (two) times daily. 30 tablet 0  . ONDANSETRON HCL 4 MG PO TABS  as needed.    Marland Kitchen PROMETHAZINE HCL 25 MG PO TABS Oral Take 1 tablet (25 mg total) by mouth every 6 (six) hours as needed for nausea. 20 tablet 0  . TEMAZEPAM 15 MG PO CAPS  Take 1 to 2 tabs by mouth at bedtime as needed for sleep. 30 capsule 0  . TRAZODONE HCL 100 MG PO TABS Oral Take 1 tablet (100 mg total) by mouth at bedtime. 30 tablet 0  . VERAPAMIL HCL ER 240 MG PO TBCR Oral Take 240 mg by mouth at  bedtime.        BP 147/82  Pulse 77  Temp(Src) 98.7 F (37.1 C) (Oral)  Resp 18  Ht 5\' 9"  (1.753 m)  Wt 270 lb (122.471 kg)  BMI 39.87 kg/m2  SpO2 95%  Physical Exam  Nursing note and vitals reviewed. Constitutional: He is oriented to person, place, and time. He appears well-developed and well-nourished. No distress.  HENT:  Head: Normocephalic and atraumatic.  Right Ear: External ear normal.  Left Ear: External ear normal.  Mouth/Throat: Oropharynx is clear and moist.  Eyes: Conjunctivae and EOM are normal. Pupils are equal, round, and reactive to light.  Neck: Normal range of motion. Neck supple.  Cardiovascular: Normal rate, regular rhythm, normal heart sounds and intact distal pulses.  Exam reveals no gallop and no friction rub.   No murmur heard. Pulmonary/Chest: Effort normal and breath sounds normal. No respiratory distress. He exhibits no tenderness.  Abdominal: Soft. Bowel sounds are normal. There is no tenderness. There is no rebound and no guarding.  Musculoskeletal: Normal range of motion. He exhibits no tenderness.  Neurological: He is alert and oriented to person, place, and time. No cranial nerve deficit.  Skin: Skin is warm and dry.    ED Course  Procedures (including critical care time)  Labs Reviewed - No data to display No results found.   1. Viral illness       MDM  The patient likely has an early viral illness.there is no concern about a malignant cause of his headache is gradual in onset and associated with a viral-type prodrome. He has been exposed to his daughter who was ill yesterday. He was given an anti-inflammatory for his headache as well as an anti-emetics. There is no indication for additional testing at this time. The patient is well-hydrated. Instructed to followup with his primary care physician.        Dayton Bailiff, MD 06/08/11 [phone]

## 2011-06-22 ENCOUNTER — Ambulatory Visit (INDEPENDENT_AMBULATORY_CARE_PROVIDER_SITE_OTHER): Payer: BC Managed Care – PPO | Admitting: Pulmonary Disease

## 2011-06-22 ENCOUNTER — Ambulatory Visit: Payer: BC Managed Care – PPO | Admitting: Pulmonary Disease

## 2011-06-22 DIAGNOSIS — G4700 Insomnia, unspecified: Secondary | ICD-10-CM

## 2011-06-22 NOTE — Patient Instructions (Signed)
STOP taking Lunesta & Restoril Take 5 mg MELATONIN at 6pm (health food store) Take ativan 1 mg (1/2 tab ) at 8 pm If you are unable to fall asleep x 1 hr or if you wake up before 3am, OK to take sub lingual ambien- samples Call back if this strategy works for Rx

## 2011-06-22 NOTE — Progress Notes (Signed)
  Subjective:    Patient ID: Ryan Hickman, male    DOB: 05/18/1960, 51 y.o.   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 He has prolonged sleep latency & also inability to maintain sleep - wakes up every 2 h. He has tried [place] (no effect), restoril (some benefit),? Trazodone (side effects) & has been taking lorazepam 3 mg since 2010. He is also on prozac daytime for Depression. - effexor made him irritable. He reports vivid dreams that bring back memories of childhood, but do not seem to be traumatic events.  Bedtime is 8pm, latency 1-2 h, sleeps on his side with 1 pilllow, 5-10 awakenings including nocturia, snoring has occasionally woken himself up, oob at 0600 feeling tired, no dryness or headaches. He has gained 30 lbs in the last 2 yrs. He drinks a cup of coffee daily & beer on weekends, no caffeinated drinks.  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   04/2011 >>PSG showed moderate degree of obstructive sleep apnea - AHI 26/h, corrected by CPAP 12 cm , standard fisher paykel mask  Slept well night of study   06/22/2011 3mg  lunesta approved by his insurance Download 1/9-06/01/11 >> good control of events-avg 6h Lunesta making him feel lightheaded and unsteady on feet Lunesta 3mg  worked well x couple of nights, then made him  feel lightheaded and unsteady on feet Temazepam 30 mg did not work as well  Did not tolerate retrial of  trazodone  Plan to taper ativan to 2 mg now , then by 1 mg every 2 weeks if alternative agent is found  He has not slept well in a few nights now & is finding it hard to function at work [employer] last night for stomach upset   Review of Systems Patient denies significant dyspnea,cough, hemoptysis,  chest pain, palpitations, pedal edema, orthopnea, paroxysmal nocturnal dyspnea, lightheadedness, nausea,  vomiting, abdominal or  leg pains      Objective:   Physical Exam  Gen. Pleasant, well-nourished, in no distress ENT - no lesions, no post nasal drip Neck: No JVD, no thyromegaly, no carotid bruits Lungs: no use of accessory muscles, no dullness to percussion, clear without rales or rhonchi  Cardiovascular: Rhythm regular, heart sounds  normal, no murmurs or gallops, no peripheral edema Musculoskeletal: No deformities, no cyanosis or clubbing        Assessment & Plan:

## 2011-06-23 ENCOUNTER — Encounter: Payer: Self-pay | Admitting: Pulmonary Disease

## 2011-06-23 NOTE — Assessment & Plan Note (Signed)
We have pretty much tried all kinds of sedatives-will try to combine sub lingual ambien & wean him off ativan if possible STOP taking Lunesta & Restoril Take 5 mg MELATONIN at 6pm ([facility] food store) Take ativan 1 mg (1/2 tab ) at 8 pm If you are unable to fall asleep x 1 hr or if you wake up before 3am, OK to take sub lingual ambien- samples Call back if this strategy works for Rx -

## 2011-06-26 ENCOUNTER — Telehealth: Payer: Self-pay | Admitting: Pulmonary Disease

## 2011-06-26 NOTE — Telephone Encounter (Signed)
[id number].Avory Mimbs, CMA  

## 2011-06-27 NOTE — Telephone Encounter (Signed)
lmomtcb for pt 

## 2011-06-28 MED ORDER — ZOLPIDEM TARTRATE 3.5 MG SL SUBL: 1.0000 | SUBLINGUAL_TABLET | Freq: Every day | SUBLINGUAL | Status: DC

## 2011-06-28 NOTE — Telephone Encounter (Signed)
I spoke with pt and notified of recs per RA. He verbalized understanding. RA, the intermezzo does not come in 3.75 but does come in 3.5 mg- please clarify what to call in. Thanks!

## 2011-06-28 NOTE — Telephone Encounter (Signed)
Spoke with pt. He states that he feels that the intermezzo is working okay. He states he wakes up around midnight and after takes medication, is able to fall back asleep fairly quickly and denies any hung over feeling in the am. Would like rx called in. Please advise, thanks!

## 2011-06-28 NOTE — Telephone Encounter (Signed)
Ok to call in higher dose - I believe 3.75 mg x 30 days x 1 refill Pl ask him to keep lowering ativan dose every 1-2 weeks

## 2011-06-28 NOTE — Telephone Encounter (Signed)
rx called in. Pt is aware. Carron Curie, CMA

## 2011-06-28 NOTE — Telephone Encounter (Signed)
3.5 ok

## 2011-07-13 ENCOUNTER — Telehealth: Payer: Self-pay | Admitting: Pulmonary Disease

## 2011-07-13 NOTE — Telephone Encounter (Signed)
Called and spoke with pt.  Informed him paperwork/PA for med has been sent to [facility].  Awaiting approval or denial.  Pt aware.

## 2011-08-17 NOTE — Telephone Encounter (Signed)
Received a fax to complete another PA, so I called [facility] at [phone] and medication has been approved from 05-18-11 to 06-07-12. Pharmacy advised. Carron Curie, CMA

## 2011-08-23 NOTE — Progress Notes (Signed)
Addended by: Nira Retort on: 08/23/2011 10:39 AM   Modules accepted: Orders

## 2011-08-23 NOTE — Progress Notes (Signed)
I'm not sure how I missed this in my inbasket.  Anyway, we need to check some additional labs on patient, then have him return to see Dr. Darrick Penna after labs are reviewed. Was to return in 6 mos to see [place] (which would have been around now), but I don't see this has been scheduled.  Regardless, lets get hepatitis panel, AMA, ANA, ASMA, immunoglobulins, ferritin, TIBC. Let's make an appt for mid to late May to give time to get labs back.  Thanks! I have put labs in.

## 2011-08-23 NOTE — Progress Notes (Signed)
LMOM to call. Lab orders mailed to pt with note to go fasting to the lab. Call and schedule office visit in mid to late May.

## 2011-08-23 NOTE — Progress Notes (Signed)
Refer back to OV of 02/01/2011 for recent info.

## 2011-09-06 ENCOUNTER — Telehealth: Payer: Self-pay | Admitting: Gastroenterology

## 2011-09-06 NOTE — Telephone Encounter (Signed)
Pt said he has not done blood work. Has not received any orders. I told him I will fax them to [facility] and he said he can't do them until 1st week of June. He is aware he needs appt with Dr. Darrick Penna.

## 2011-09-06 NOTE — Telephone Encounter (Signed)
Pt still needs to get blood work done.  Also needs appt with [place], non-urgent. Make sure blood work done prior.  See [facility].

## 2011-09-07 ENCOUNTER — Ambulatory Visit: Payer: BC Managed Care – PPO | Admitting: Pulmonary Disease

## 2011-09-07 NOTE — Telephone Encounter (Signed)
Mailed lab orders to pt since he cannot do until June.

## 2011-09-07 NOTE — Telephone Encounter (Signed)
I called an talked to the patient to schedule appointment and he said he would have to call me back to schedule the appointment he has a different work schedule.

## 2012-04-07 ENCOUNTER — Encounter (HOSPITAL_COMMUNITY): Payer: Self-pay | Admitting: Emergency Medicine

## 2012-04-07 ENCOUNTER — Emergency Department (HOSPITAL_COMMUNITY)
Admission: EM | Admit: 2012-04-07 | Discharge: 2012-04-07 | Disposition: A | Discharge status: Home / Self Care | Payer: BC Managed Care – PPO | Attending: Emergency Medicine | Admitting: Emergency Medicine

## 2012-04-07 DIAGNOSIS — Z7982 Long term (current) use of aspirin: Secondary | ICD-10-CM | POA: Insufficient documentation

## 2012-04-07 DIAGNOSIS — J45909 Unspecified asthma, uncomplicated: Secondary | ICD-10-CM | POA: Insufficient documentation

## 2012-04-07 DIAGNOSIS — E785 Hyperlipidemia, unspecified: Secondary | ICD-10-CM | POA: Insufficient documentation

## 2012-04-07 DIAGNOSIS — H9209 Otalgia, unspecified ear: Secondary | ICD-10-CM | POA: Insufficient documentation

## 2012-04-07 DIAGNOSIS — Z87891 Personal history of nicotine dependence: Secondary | ICD-10-CM | POA: Insufficient documentation

## 2012-04-07 DIAGNOSIS — K219 Gastro-esophageal reflux disease without esophagitis: Secondary | ICD-10-CM | POA: Insufficient documentation

## 2012-04-07 DIAGNOSIS — J029 Acute pharyngitis, unspecified: Secondary | ICD-10-CM | POA: Insufficient documentation

## 2012-04-07 DIAGNOSIS — Z79899 Other long term (current) drug therapy: Secondary | ICD-10-CM | POA: Insufficient documentation

## 2012-04-07 DIAGNOSIS — F329 Major depressive disorder, single episode, unspecified: Secondary | ICD-10-CM | POA: Insufficient documentation

## 2012-04-07 DIAGNOSIS — I10 Essential (primary) hypertension: Secondary | ICD-10-CM | POA: Insufficient documentation

## 2012-04-07 DIAGNOSIS — F3289 Other specified depressive episodes: Secondary | ICD-10-CM | POA: Insufficient documentation

## 2012-04-07 MED ORDER — IBUPROFEN 800 MG PO TABS
800.0000 mg | ORAL_TABLET | Freq: Once | ORAL | Status: AC
  Administered 2012-04-07: 800 mg via ORAL
  Filled 2012-04-07: qty 1

## 2012-04-07 MED ORDER — PENICILLIN G BENZATHINE 1200000 UNIT/2ML IM SUSP
1.2000 10*6.[IU] | Freq: Once | INTRAMUSCULAR | Status: AC
  Administered 2012-04-07: 1.2 10*6.[IU] via INTRAMUSCULAR
  Filled 2012-04-07: qty 2

## 2012-04-07 NOTE — ED Provider Notes (Signed)
Medical screening examination/treatment/procedure(s) were performed by non-physician practitioner and as supervising physician I was immediately available for consultation/collaboration. Devoria Albe, MD, Armando Gang   Ward Givens, MD 04/07/12 [phone]

## 2012-04-07 NOTE — ED Notes (Signed)
Pt c/o sore throat and left ear pain x 2 days 

## 2012-04-07 NOTE — ED Provider Notes (Signed)
History     CSN: [id number]  Arrival date & time 04/07/12  1146   First MD Initiated Contact with Patient 04/07/12 1318      Chief Complaint  Patient presents with  . Sore Throat  . Otalgia    (Consider location/radiation/quality/duration/timing/severity/associated sxs/prior treatment) HPI Comments: No other complaints.  Pt of dr. Lilyan Punt.  Patient is a 51 y.o. male presenting with pharyngitis and ear pain. The history is provided by the patient. No language interpreter was used.  Sore Throat This is a new problem. Episode onset: 3 days ago. The problem occurs constantly. The problem has been unchanged. Associated symptoms include a sore throat. Pertinent negatives include no chills, coughing, fever, myalgias, rash or swollen glands. The symptoms are aggravated by swallowing. He has tried nothing for the symptoms.  Otalgia Associated symptoms include sore throat. Pertinent negatives include no cough and no rash.    Past Medical History  Diagnosis Date  . Hypertension   . Depression   . Acid reflux   . Hyperlipidemia   . Insomnia   . Hepatitis     non-viral, r/t ETOH/obesity  . Childhood asthma     Past Surgical History  Procedure Date  . Knee surgery 1992  . Colonoscopy May 2010    Dr. Darrick Penna: simple adenoma, normal colon. Repeat 2020  . Upper gastrointestinal endoscopy May 2010    Dr. Darrick Penna: normal    Family History  Problem Relation Age of Onset  . Colon cancer Neg Hx     History  Substance Use Topics  . Smoking status: Former Smoker -- 3.0 packs/day for 25 years    Types: Cigarettes    Quit date: 01/19/2003  . Smokeless tobacco: Never Used  . Alcohol Use: Yes     Comment: couple days a week      Review of Systems  Constitutional: Negative for fever and chills.  HENT: Positive for ear pain and sore throat.   Respiratory: Negative for cough and shortness of breath.   Musculoskeletal: Negative for myalgias.  Skin: Negative for rash.  All  other systems reviewed and are negative.    Allergies  Daypro and Levofloxacin  Home Medications   Current Outpatient Rx  Name  Route  Sig  Dispense  Refill  . ASPIRIN 81 MG PO CHEW   Oral   Chew 81 mg by mouth daily.           Marland Kitchen DIPHENOXYLATE-ATROPINE 2.5-0.025 MG PO TABS   Oral   Take 1 tablet by mouth 3 (three) times daily as needed. diarrhea         . ENALAPRIL MALEATE 20 MG PO TABS   Oral   Take 20 mg by mouth 2 (two) times daily.           Marland Kitchen FLUOXETINE HCL 20 MG PO CAPS   Oral   Take 1 tablet by mouth daily.         Marland Kitchen HYDROCHLOROTHIAZIDE 25 MG PO TABS   Oral   Take 25 mg by mouth every morning.           Marland Kitchen LANSOPRAZOLE 15 MG PO CPDR   Oral   Take 15 mg by mouth every morning.           Marland Kitchen LORAZEPAM 2 MG PO TABS   Oral   Take 2 mg by mouth at bedtime as needed. Sleep.         Marland Kitchen VERAPAMIL HCL ER 240 MG PO TBCR  Oral   Take 240 mg by mouth at bedtime.             BP 160/80  Pulse 84  Temp 98.7 F (37.1 C) (Oral)  Resp 18  Ht 5' 10.5" (1.791 m)  Wt 266 lb (120.657 kg)  BMI 37.63 kg/m2  SpO2 95%  Physical Exam  Nursing note and vitals reviewed. Constitutional: He is oriented to person, place, and time. He appears well-developed and well-nourished.  HENT:  Head: Normocephalic and atraumatic.  Mouth/Throat: Uvula is midline and mucous membranes are normal. No uvula swelling. Posterior oropharyngeal erythema present. No oropharyngeal exudate, posterior oropharyngeal edema or tonsillar abscesses.  Eyes: EOM are normal.  Neck: Normal range of motion.  Cardiovascular: Normal rate, regular rhythm and intact distal pulses.   Pulmonary/Chest: Effort normal. No respiratory distress.  Abdominal: Soft. He exhibits no distension. There is no tenderness.  Musculoskeletal: Normal range of motion.  Neurological: He is alert and oriented to person, place, and time.  Skin: Skin is warm and dry.  Psychiatric: He has a normal mood and affect. Judgment  normal.    ED Course  Procedures (including critical care time)  Labs Reviewed - No data to display No results found.   1. Pharyngitis       MDM  Bicillin LA Ibuprofen, gargles, chloraseptic F/u with PCP        Evalina Field, PA 04/07/12 1329  Evalina Field, PA 04/07/12 1329

## 2012-05-28 ENCOUNTER — Emergency Department (HOSPITAL_COMMUNITY)
Admission: EM | Admit: 2012-05-28 | Discharge: 2012-05-28 | Disposition: A | Discharge status: Home / Self Care | Payer: BC Managed Care – PPO | Attending: Emergency Medicine | Admitting: Emergency Medicine

## 2012-05-28 ENCOUNTER — Encounter (HOSPITAL_COMMUNITY): Payer: Self-pay | Admitting: Emergency Medicine

## 2012-05-28 DIAGNOSIS — R109 Unspecified abdominal pain: Secondary | ICD-10-CM | POA: Insufficient documentation

## 2012-05-28 DIAGNOSIS — Z8719 Personal history of other diseases of the digestive system: Secondary | ICD-10-CM | POA: Insufficient documentation

## 2012-05-28 DIAGNOSIS — Z87891 Personal history of nicotine dependence: Secondary | ICD-10-CM | POA: Insufficient documentation

## 2012-05-28 DIAGNOSIS — F329 Major depressive disorder, single episode, unspecified: Secondary | ICD-10-CM | POA: Insufficient documentation

## 2012-05-28 DIAGNOSIS — R509 Fever, unspecified: Secondary | ICD-10-CM | POA: Insufficient documentation

## 2012-05-28 DIAGNOSIS — G4700 Insomnia, unspecified: Secondary | ICD-10-CM | POA: Insufficient documentation

## 2012-05-28 DIAGNOSIS — Z79899 Other long term (current) drug therapy: Secondary | ICD-10-CM | POA: Insufficient documentation

## 2012-05-28 DIAGNOSIS — J45909 Unspecified asthma, uncomplicated: Secondary | ICD-10-CM | POA: Insufficient documentation

## 2012-05-28 DIAGNOSIS — Z7982 Long term (current) use of aspirin: Secondary | ICD-10-CM | POA: Insufficient documentation

## 2012-05-28 DIAGNOSIS — A084 Viral intestinal infection, unspecified: Secondary | ICD-10-CM

## 2012-05-28 DIAGNOSIS — R61 Generalized hyperhidrosis: Secondary | ICD-10-CM | POA: Insufficient documentation

## 2012-05-28 DIAGNOSIS — R197 Diarrhea, unspecified: Secondary | ICD-10-CM | POA: Insufficient documentation

## 2012-05-28 DIAGNOSIS — K219 Gastro-esophageal reflux disease without esophagitis: Secondary | ICD-10-CM | POA: Insufficient documentation

## 2012-05-28 DIAGNOSIS — I10 Essential (primary) hypertension: Secondary | ICD-10-CM | POA: Insufficient documentation

## 2012-05-28 DIAGNOSIS — A088 Other specified intestinal infections: Secondary | ICD-10-CM | POA: Insufficient documentation

## 2012-05-28 DIAGNOSIS — F3289 Other specified depressive episodes: Secondary | ICD-10-CM | POA: Insufficient documentation

## 2012-05-28 LAB — CBC WITH DIFFERENTIAL/PLATELET
Basophils Absolute: 0 10*3/uL (ref 0.0–0.1)
Basophils Relative: 0 % (ref 0–1)
Eosinophils Absolute: 0.6 10*3/uL (ref 0.0–0.7)
Eosinophils Relative: 8 % — ABNORMAL HIGH (ref 0–5)
HCT: 45.9 % (ref 39.0–52.0)
Hemoglobin: 16.1 g/dL (ref 13.0–17.0)
MCH: 32.2 pg (ref 26.0–34.0)
MCHC: 35.1 g/dL (ref 30.0–36.0)
Monocytes Absolute: 0.6 10*3/uL (ref 0.1–1.0)
Monocytes Relative: 7 % (ref 3–12)
RDW: 13 % (ref 11.5–15.5)

## 2012-05-28 LAB — BASIC METABOLIC PANEL
BUN: 15 mg/dL (ref 6–23)
Calcium: 8.9 mg/dL (ref 8.4–10.5)
Creatinine, Ser: 1.08 mg/dL (ref 0.50–1.35)
GFR calc Af Amer: >90 mL/min (ref >90)
GFR calc non Af Amer: 78 mL/min — ABNORMAL LOW (ref >90)

## 2012-05-28 LAB — URINALYSIS, ROUTINE W REFLEX MICROSCOPIC
Ketones, ur: NEGATIVE mg/dL
Nitrite: NEGATIVE
Urobilinogen, UA: 0.2 mg/dL (ref 0.0–1.0)
pH: 6 (ref 5.0–8.0)

## 2012-05-28 MED ORDER — DIPHENOXYLATE-ATROPINE 2.5-0.025 MG PO TABS
ORAL_TABLET | ORAL | Status: AC
  Filled 2012-05-28: qty 1

## 2012-05-28 MED ORDER — ONDANSETRON HCL 4 MG/2ML IJ SOLN
4.0000 mg | Freq: Once | INTRAMUSCULAR | Status: AC
  Administered 2012-05-28: 4 mg via INTRAVENOUS
  Filled 2012-05-28: qty 2

## 2012-05-28 MED ORDER — DIPHENOXYLATE-ATROPINE 2.5-0.025 MG PO TABS: ORAL_TABLET | ORAL | Status: DC

## 2012-05-28 MED ORDER — DIPHENOXYLATE-ATROPINE 2.5-0.025 MG PO TABS
2.0000 | ORAL_TABLET | Freq: Once | ORAL | Status: AC
  Administered 2012-05-28: 2 via ORAL
  Filled 2012-05-28: qty 2

## 2012-05-28 MED ORDER — PROMETHAZINE HCL 25 MG PO TABS: 25.0000 mg | ORAL_TABLET | Freq: Four times a day (QID) | ORAL | Status: DC | PRN

## 2012-05-28 MED ORDER — SODIUM CHLORIDE 0.9 % IV SOLN
INTRAVENOUS | Status: DC
  Administered 2012-05-28: 08:00:00 via INTRAVENOUS

## 2012-05-28 NOTE — ED Notes (Signed)
Pt unable to void at this time. 

## 2012-05-28 NOTE — ED Provider Notes (Signed)
History    This chart was scribed for Osvaldo Human, MD, MD by Smitty Pluck, ED Scribe. The patient was seen in room APA08/APA08 and the patient's care was started at 7:18AM.   CSN: [id number]  Arrival date & time 05/28/12  0708       No chief complaint on file.    The history is provided by the patient. No language interpreter was used.   Ryan Hickman is a 52 y.o. male who presents to the Emergency Department complaining of constant, moderate diarrhea onset 1 day ago. The diarrhea is watery. Pt reports that he has associated lower abdominal pain, emesis and nausea onset 1 day ago. He vomited 4x since onset. The vomit has been food contents. Pt vomited last 7 hours ago. He reports that he is unable to keep food down. He reports that he had episodes of diaphoresis, fever (current temperature in ED is 99.4) and chills. Pt denies chest pain, earache, sore throat, rash, dysuria, syncope, weakness, cough, SOB and any other pain. He states his wife was sick with same symptoms.    Past Medical History  Diagnosis Date  . Hypertension   . Depression   . Acid reflux   . Hyperlipidemia   . Insomnia   . Hepatitis     non-viral, r/t ETOH/obesity  . Childhood asthma     Past Surgical History  Procedure Date  . Knee surgery 1992  . Colonoscopy May 2010    Dr. Darrick Penna: simple adenoma, normal colon. Repeat 2020  . Upper gastrointestinal endoscopy May 2010    Dr. Darrick Penna: normal    Family History  Problem Relation Age of Onset  . Colon cancer Neg Hx     History  Substance Use Topics  . Smoking status: Former Smoker -- 3.0 packs/day for 25 years    Types: Cigarettes    Quit date: 01/19/2003  . Smokeless tobacco: Never Used  . Alcohol Use: Yes     Comment: couple days a week      Review of Systems  Constitutional: Positive for fever, chills and diaphoresis.  HENT: Negative for ear pain and sore throat.   Respiratory: Negative for cough and shortness of breath.    Cardiovascular: Negative for chest pain.  Gastrointestinal: Positive for nausea, vomiting and abdominal pain.  Genitourinary: Negative for dysuria.  Neurological: Negative for syncope and weakness.  All other systems reviewed and are negative.    Allergies  Daypro and Levofloxacin  Home Medications   Current Outpatient Rx  Name  Route  Sig  Dispense  Refill  . ASPIRIN 81 MG PO CHEW   Oral   Chew 81 mg by mouth daily.           Marland Kitchen DIPHENOXYLATE-ATROPINE 2.5-0.025 MG PO TABS   Oral   Take 1 tablet by mouth 3 (three) times daily as needed. diarrhea         . ENALAPRIL MALEATE 20 MG PO TABS   Oral   Take 20 mg by mouth 2 (two) times daily.           Marland Kitchen FLUOXETINE HCL 20 MG PO CAPS   Oral   Take 1 tablet by mouth daily.         Marland Kitchen HYDROCHLOROTHIAZIDE 25 MG PO TABS   Oral   Take 25 mg by mouth every morning.           Marland Kitchen LANSOPRAZOLE 15 MG PO CPDR   Oral   Take 15 mg  by mouth every morning.           Marland Kitchen LORAZEPAM 2 MG PO TABS   Oral   Take 2 mg by mouth at bedtime as needed. Sleep.         Marland Kitchen VERAPAMIL HCL ER 240 MG PO TBCR   Oral   Take 240 mg by mouth at bedtime.             BP 127/90  Pulse 98  Temp 99.4 F (37.4 C) (Oral)  Resp 18  Ht 5\' 10"  (1.778 m)  Wt 270 lb (122.471 kg)  BMI 38.74 kg/m2  SpO2 95%  Physical Exam  Nursing note and vitals reviewed. Constitutional: He is oriented to person, place, and time. He appears well-developed and well-nourished. No distress.  HENT:  Head: Normocephalic and atraumatic.  Mouth/Throat: Mucous membranes are dry.  Eyes: EOM are normal. Pupils are equal, round, and reactive to light.  Neck: Normal range of motion. Neck supple. No tracheal deviation present.  Cardiovascular: Normal rate, regular rhythm and normal heart sounds.   Pulmonary/Chest: Effort normal and breath sounds normal. No respiratory distress. He has no wheezes. He has no rales.  Abdominal: Soft. He exhibits no distension. There is no  tenderness. There is no rebound and no guarding.  Musculoskeletal: Normal range of motion.  Neurological: He is alert and oriented to person, place, and time. No cranial nerve deficit.  Skin: Skin is warm. No rash noted. He is diaphoretic.  Psychiatric: He has a normal mood and affect. His behavior is normal.    ED Course  Procedures (including critical care time) DIAGNOSTIC STUDIES: Oxygen Saturation is 95% on room air, adequate by my interpretation.    COORDINATION OF CARE: 7:24 AM Discussed ED treatment with pt and pt agrees.  7:32 AM Ordered:   Medications  0.9 %  sodium chloride infusion (  Intravenous New Bag/Given 05/28/12 0752)  omeprazole (PRILOSEC) 20 MG capsule (not administered)  FLUoxetine (PROZAC) 40 MG capsule (not administered)  Melatonin 3 MG CAPS (not administered)  ondansetron (ZOFRAN) injection 4 mg (4 mg Intravenous Given 05/28/12 0752)  diphenoxylate-atropine (LOMOTIL) 2.5-0.025 MG per tablet 2 tablet (2 tablet Oral Given 05/28/12 0753)        Labs Reviewed  CBC WITH DIFFERENTIAL - Abnormal; Notable for the following:    Eosinophils Relative 8 (*)     All other components within normal limits  BASIC METABOLIC PANEL - Abnormal; Notable for the following:    Glucose, Bld 116 (*)     GFR calc non Af Amer 78 (*)     All other components within normal limits  URINALYSIS, ROUTINE W REFLEX MICROSCOPIC - Abnormal; Notable for the following:    Specific Gravity, Urine >1.030 (*)     Bilirubin Urine SMALL (*)     Protein, ur TRACE (*)     All other components within normal limits  URINE MICROSCOPIC-ADD ON   9:06 AM Rechecked with pt.  He has had no further vomiting, but did have another episode of diarrhea.  He is receiving IV fluids, has had IV Zofran and PO Lomotil, which he has kept down.    9:29 AM Results for Ryan Hickman, Ryan Hickman (MRN [id number]) as of 05/28/2012 09:28  Ref. Range 05/28/2012 07:29 05/28/2012 09:00  Sodium Latest Range: 135-145 mEq/L 135   Potassium  Latest Range: 3.5-5.1 mEq/L 3.8   Chloride Latest Range: 96-112 mEq/L 97   CO2 Latest Range: 19-32 mEq/L 28   BUN Latest  Range: 6-23 mg/dL 15   Creatinine Latest Range: 0.50-1.35 mg/dL 2.95   Calcium Latest Range: 8.4-10.5 mg/dL 8.9   GFR calc non Af Amer Latest Range: >90 mL/min 78 (L)   GFR calc Af Amer Latest Range: >90 mL/min >90   Glucose Latest Range: 70-99 mg/dL 621 (H)   WBC Latest Range: 4.0-10.5 K/uL 8.1   RBC Latest Range: 4.22-5.81 MIL/uL 5.00   Hemoglobin Latest Range: 13.0-17.0 g/dL 30.8   HCT Latest Range: 39.0-52.0 % 45.9   MCV Latest Range: 78.0-100.0 fL 91.8   MCH Latest Range: 26.0-34.0 pg 32.2   MCHC Latest Range: 30.0-36.0 g/dL 65.7   RDW Latest Range: 11.5-15.5 % 13.0   Platelets Latest Range: 150-400 K/uL 163   Neutrophils Relative Latest Range: 43-77 % 73   Lymphocytes Relative Latest Range: 12-46 % 12   Monocytes Relative Latest Range: 3-12 % 7   Eosinophils Relative Latest Range: 0-5 % 8 (H)   Basophils Relative Latest Range: 0-1 % 0   NEUT# Latest Range: 1.7-7.7 K/uL 5.9   Lymphocytes Absolute Latest Range: 0.7-4.0 K/uL 1.0   Monocytes Absolute Latest Range: 0.1-1.0 K/uL 0.6   Eosinophils Absolute Latest Range: 0.0-0.7 K/uL 0.6   Basophils Absolute Latest Range: 0.0-0.1 K/uL 0.0   Color, Urine Latest Range: YELLOW   YELLOW  APPearance Latest Range: CLEAR   CLEAR  Specific Gravity, Urine Latest Range: 1.005-1.030   >1.030 (H)  pH Latest Range: 5.0-8.0   6.0  Glucose Latest Range: NEGATIVE mg/dL  NEGATIVE  Bilirubin Urine Latest Range: NEGATIVE   SMALL (A)  Ketones, ur Latest Range: NEGATIVE mg/dL  NEGATIVE  Protein Latest Range: NEGATIVE mg/dL  TRACE (A)  Urobilinogen, UA Latest Range: 0.0-1.0 mg/dL  0.2  Nitrite Latest Range: NEGATIVE   NEGATIVE  Leukocytes, UA Latest Range: NEGATIVE   NEGATIVE  Hgb urine dipstick Latest Range: NEGATIVE   NEGATIVE  WBC, UA Latest Range: <3 WBC/hpf  0-2  Squamous Epithelial / LPF Latest Range: RARE   RARE   Lab  workup is essentially negative.  Plan to Rx symptomatically with Phenergan q4h prn nausea, Lomotil 2 tabs q4h prn diarrhea, force fluids.    IMP:  Viral Gastroenteritis.   I personally performed the services described in this documentation, which was scribed in my presence. The recorded information has been reviewed and is accurate. Osvaldo Human, MD         Carleene Cooper III, MD 05/28/12 [phone]

## 2012-05-28 NOTE — ED Notes (Signed)
Patient c/o nausea and vomiting. Per patient started feeling bad yesterday. Vomiting started last night. Per patient vomited 6 times in past 24 hours. No active vomiting at this time.

## 2012-07-23 ENCOUNTER — Other Ambulatory Visit: Payer: Self-pay | Admitting: Pulmonary Disease

## 2012-08-25 ENCOUNTER — Emergency Department (HOSPITAL_COMMUNITY)
Admission: EM | Admit: 2012-08-25 | Discharge: 2012-08-25 | Disposition: A | Discharge status: Home / Self Care | Payer: 59 | Attending: Emergency Medicine | Admitting: Emergency Medicine

## 2012-08-25 ENCOUNTER — Encounter (HOSPITAL_COMMUNITY): Payer: Self-pay

## 2012-08-25 ENCOUNTER — Emergency Department (HOSPITAL_COMMUNITY): Payer: 59

## 2012-08-25 DIAGNOSIS — Z79899 Other long term (current) drug therapy: Secondary | ICD-10-CM | POA: Insufficient documentation

## 2012-08-25 DIAGNOSIS — Z8719 Personal history of other diseases of the digestive system: Secondary | ICD-10-CM | POA: Insufficient documentation

## 2012-08-25 DIAGNOSIS — I10 Essential (primary) hypertension: Secondary | ICD-10-CM | POA: Insufficient documentation

## 2012-08-25 DIAGNOSIS — E785 Hyperlipidemia, unspecified: Secondary | ICD-10-CM | POA: Insufficient documentation

## 2012-08-25 DIAGNOSIS — F329 Major depressive disorder, single episode, unspecified: Secondary | ICD-10-CM | POA: Insufficient documentation

## 2012-08-25 DIAGNOSIS — F3289 Other specified depressive episodes: Secondary | ICD-10-CM | POA: Insufficient documentation

## 2012-08-25 DIAGNOSIS — Z87891 Personal history of nicotine dependence: Secondary | ICD-10-CM | POA: Insufficient documentation

## 2012-08-25 DIAGNOSIS — M25469 Effusion, unspecified knee: Secondary | ICD-10-CM | POA: Insufficient documentation

## 2012-08-25 DIAGNOSIS — K219 Gastro-esophageal reflux disease without esophagitis: Secondary | ICD-10-CM | POA: Insufficient documentation

## 2012-08-25 DIAGNOSIS — J45909 Unspecified asthma, uncomplicated: Secondary | ICD-10-CM | POA: Insufficient documentation

## 2012-08-25 DIAGNOSIS — G4700 Insomnia, unspecified: Secondary | ICD-10-CM | POA: Insufficient documentation

## 2012-08-25 DIAGNOSIS — Z7982 Long term (current) use of aspirin: Secondary | ICD-10-CM | POA: Insufficient documentation

## 2012-08-25 DIAGNOSIS — M25462 Effusion, left knee: Secondary | ICD-10-CM

## 2012-08-25 MED ORDER — HYDROCODONE-ACETAMINOPHEN 5-325 MG PO TABS: 1.0000 | ORAL_TABLET | ORAL | Status: DC | PRN

## 2012-08-25 NOTE — ED Notes (Signed)
Pt c/o left knee pain x 1 week.  Pt says started hurting after putting a roof on his house.

## 2012-08-25 NOTE — ED Provider Notes (Signed)
Medical screening examination/treatment/procedure(s) were performed by non-physician practitioner and as supervising physician I was immediately available for consultation/collaboration.    Shantil Vallejo D Ahliya Glatt, MD 08/25/12 2118 

## 2012-08-25 NOTE — ED Provider Notes (Signed)
History     CSN: [id number]  Arrival date & time 08/25/12  1009   First MD Initiated Contact with Patient 08/25/12 1106      Chief Complaint  Patient presents with  . Knee Pain    (Consider location/radiation/quality/duration/timing/severity/associated sxs/prior treatment) Patient is a 52 y.o. male presenting with knee pain. The history is provided by the patient.  Knee Pain Location:  Knee Time since incident:  1 week Injury: yes   Mechanism of injury comment:  He planted his feet and twisted his body,  causing pain in his bilateral knees,  the right having resolved,  the left knee continues to have pain. Knee location:  L knee Pain details:    Quality:  Aching   Radiates to:  Does not radiate   Severity:  Moderate   Onset quality:  Sudden   Timing:  Constant   Progression:  Unchanged Chronicity:  New Dislocation: no   Prior injury to area:  No Relieved by:  Rest Worsened by:  Activity, bearing weight and flexion Ineffective treatments:  NSAIDs Associated symptoms: no back pain, no decreased ROM, no fever, no numbness, no stiffness and no swelling   Risk factors: obesity   Risk factors: no frequent fractures and no known bone disorder     Past Medical History  Diagnosis Date  . Hypertension   . Depression   . Acid reflux   . Hyperlipidemia   . Insomnia   . Hepatitis     non-viral, r/t ETOH/obesity  . Childhood asthma     Past Surgical History  Procedure Laterality Date  . Knee surgery  1992  . Colonoscopy  May 2010    Dr. Darrick Penna: simple adenoma, normal colon. Repeat 2020  . Upper gastrointestinal endoscopy  May 2010    Dr. Darrick Penna: normal    Family History  Problem Relation Age of Onset  . Colon cancer Neg Hx   . Heart failure Other     History  Substance Use Topics  . Smoking status: Former Smoker -- 3.00 packs/day for 25 years    Types: Cigarettes    Quit date: 01/19/2003  . Smokeless tobacco: Never Used  . Alcohol Use: Yes     Comment:  couple days a week      Review of Systems  Constitutional: Negative for fever.  Musculoskeletal: Positive for arthralgias. Negative for myalgias, back pain, joint swelling and stiffness.  Neurological: Negative for weakness and numbness.    Allergies  Daypro and Levofloxacin  Home Medications   Current Outpatient Rx  Name  Route  Sig  Dispense  Refill  . aspirin 325 MG tablet   Oral   Take 325 mg by mouth daily.         . enalapril (VASOTEC) 20 MG tablet   Oral   Take 20 mg by mouth 2 (two) times daily.           Marland Kitchen FLUoxetine (PROZAC) 40 MG capsule   Oral   Take 40 mg by mouth daily.         Marland Kitchen ibuprofen (ADVIL,MOTRIN) 200 MG tablet   Oral   Take 400 mg by mouth every 6 (six) hours as needed for pain.         Marland Kitchen LORazepam (ATIVAN) 2 MG tablet   Oral   Take 3 mg by mouth at bedtime.         . Melatonin 3 MG CAPS   Oral   Take 1 capsule by mouth  at bedtime.          Marland Kitchen omeprazole (PRILOSEC) 20 MG capsule   Oral   Take 20 mg by mouth daily.         . verapamil (CALAN-SR) 240 MG CR tablet   Oral   Take 240 mg by mouth at bedtime.         Marland Kitchen HYDROcodone-acetaminophen (NORCO/VICODIN) 5-325 MG per tablet   Oral   Take 1 tablet by mouth every 4 (four) hours as needed for pain.   20 tablet   0     BP 117/75  Pulse 77  Temp(Src) 99.3 F (37.4 C) (Oral)  Resp 18  Ht 5\' 10"  (1.778 m)  Wt 260 lb (117.935 kg)  BMI 37.31 kg/m2  SpO2 97%  Physical Exam  Constitutional: He appears well-developed and well-nourished.  HENT:  Head: Atraumatic.  Neck: Normal range of motion.  Cardiovascular:  Pulses equal bilaterally  Musculoskeletal: He exhibits tenderness.       Left knee: He exhibits no swelling, no effusion, no deformity, no erythema, normal alignment, no LCL laxity and no MCL laxity. Tenderness found. Medial joint line tenderness noted.  No crepitus with ROM of the left knee.  Neurological: He is alert. He has normal strength. He displays  normal reflexes. No sensory deficit.  Equal strength  Skin: Skin is warm and dry.  Psychiatric: He has a normal mood and affect.    ED Course  Procedures (including critical care time)  Labs Reviewed - No data to display Dg Knee Complete 4 Views Left  08/25/2012  *RADIOLOGY REPORT*  Clinical Data: Left knee pain  LEFT KNEE - COMPLETE 4+ VIEW  Comparison: None.  Findings: Four views of the left knee submitted.  No acute fracture or subluxation.  Small joint effusion.  IMPRESSION: No acute fracture or subluxation.  Small joint effusion.   Original Report Authenticated By: Natasha Mead, M.D.      1. Knee effusion, left       MDM  Pt placed in knee immobilizer, encouraged daytime use,  Crutches provided.  RICE,  Hydrocdone,  F/u with Dr. Hilda Lias this week. Pt to call for appt.    Patients labs and/or radiological studies were viewed and considered during the medical decision making and disposition process.small effusion on xray and localized pain medial joint line suspicious for possible meniscal injury or strain.        Burgess Amor, PA-C 08/25/12 1344

## 2012-09-20 ENCOUNTER — Other Ambulatory Visit: Payer: Self-pay | Admitting: Family Medicine

## 2012-10-09 ENCOUNTER — Ambulatory Visit
Admission: RE | Admit: 2012-10-09 | Discharge: 2012-10-09 | Disposition: A | Discharge status: Home / Self Care | Payer: No Typology Code available for payment source | Source: Ambulatory Visit | Attending: Student | Admitting: Student

## 2012-10-09 ENCOUNTER — Other Ambulatory Visit: Payer: Self-pay | Admitting: Student

## 2012-10-09 DIAGNOSIS — R7611 Nonspecific reaction to tuberculin skin test without active tuberculosis: Secondary | ICD-10-CM

## 2012-11-29 ENCOUNTER — Ambulatory Visit: Payer: 59 | Admitting: Family Medicine

## 2012-11-29 VITALS — BP 124/90 | HR 67 | Temp 98.2°F | Resp 18 | Wt 269.0 lb

## 2012-11-29 DIAGNOSIS — IMO0002 Reserved for concepts with insufficient information to code with codable children: Secondary | ICD-10-CM

## 2012-11-29 DIAGNOSIS — D239 Other benign neoplasm of skin, unspecified: Secondary | ICD-10-CM

## 2012-11-29 DIAGNOSIS — D229 Melanocytic nevi, unspecified: Secondary | ICD-10-CM

## 2012-11-29 DIAGNOSIS — S0180XA Unspecified open wound of other part of head, initial encounter: Secondary | ICD-10-CM

## 2012-11-29 NOTE — Progress Notes (Signed)
[facility]:  Office Visit  Chief Complaint:  Chief Complaint  Patient presents with  . cut above right eye    HPI: Ryan Hickman is a 52 y.o. male who complains of  Right eyebrow bleeding since this morning, hit a mole on upper eyebrow and then started hurting so decided to remove it with a clean razor. He is UTD on tetanus. He has a h/o alcohol abuse and hepatitis. He is on ASA. He denies abusing alcohol at this time.   Past Medical History  Diagnosis Date  . Hypertension   . Depression   . Acid reflux   . Hyperlipidemia   . Insomnia   . Hepatitis     non-viral, r/t ETOH/obesity  . Childhood asthma    Past Surgical History  Procedure Laterality Date  . Knee surgery  1992  . Colonoscopy  May 2010    Dr. Darrick Penna: simple adenoma, normal colon. Repeat 2020  . Upper gastrointestinal endoscopy  May 2010    Dr. Darrick Penna: normal   History   Social History  . Marital Status: Married    Spouse Name: N/A    Number of Children: N/A  . Years of Education: N/A   Social History Main Topics  . Smoking status: Former Smoker -- 3.00 packs/day for 25 years    Types: Cigarettes    Quit date: 01/19/2003  . Smokeless tobacco: Never Used  . Alcohol Use: Yes     Comment: couple days a week  . Drug Use: No  . Sexually Active: None   Other Topics Concern  . None   Social History Narrative  . None   Family History  Problem Relation Age of Onset  . Colon cancer Neg Hx   . Heart failure Other    Allergies  Allergen Reactions  . Daypro (Oxaprozin) Anaphylaxis  . Levofloxacin Hives   Prior to Admission medications   aspirin 325 MG tablet Take 325 mg by mouth daily.   Yes Historical Provider, MD  enalapril (VASOTEC) 20 MG tablet Take 20 mg by mouth 2 (two) times daily.     Yes Historical Provider, MD  FLUoxetine (PROZAC) 40 MG capsule Take 40 mg by mouth daily.   Yes Historical Provider, MD  ibuprofen (ADVIL,MOTRIN) 200 MG tablet Take 400 mg by mouth every 6  (six) hours as needed for pain.   Yes Historical Provider, MD  Melatonin 3 MG CAPS Take 1 capsule by mouth at bedtime.    Yes Historical Provider, MD  omeprazole (PRILOSEC) 20 MG capsule Take 20 mg by mouth daily.   Yes Historical Provider, MD  verapamil (CALAN-SR) 240 MG CR tablet Take 240 mg by mouth at bedtime.   Yes Historical Provider, MD  HYDROcodone-acetaminophen (NORCO/VICODIN) 5-325 MG per tablet Take 1 tablet by mouth every 4 (four) hours as needed for pain. 08/25/12   Burgess Amor, PA-C  LORazepam (ATIVAN) 2 MG tablet Take 3 mg by mouth at bedtime.    Historical Provider, MD     ROS: The patient denies fevers, chills, night sweats, unintentional weight loss, chest pain, palpitations, wheezing, dyspnea on exertion, nausea, vomiting, abdominal pain, dysuria, hematuria, melena, numbness, weakness, or tingling.   All other systems have been reviewed and were otherwise negative with the exception of those mentioned in the HPI and as above.    PHYSICAL EXAM: Filed Vitals:   11/29/12 1542  BP: 124/90  Pulse: 67  Temp: 98.2 F (36.8 C)  Resp: 18  Filed Vitals:   11/29/12 1542  Weight: 269 lb (122.018 kg)   Body mass index is 38.6 kg/(m^2).  General: Alert, no acute distress HEENT:  Normocephalic, atraumatic, oropharynx patent. EOMI, PERRLA, fundoscopic exam nl Cardiovascular:  Regular rate and rhythm, no rubs murmurs or gallops.  No Carotid bruits, radial pulse intact. No pedal edema.  Respiratory: Clear to auscultation bilaterally.  No wheezes, rales, or rhonchi.  No cyanosis, no use of accessory musculature GI: No organomegaly, abdomen is soft and non-tender, positive bowel sounds.  No masses. Skin:+ 2-3 mm lac where mole was cut off, bleeder, stopped with drysol Neurologic: Facial musculature symmetric. Psychiatric: Patient is appropriate throughout our interaction. Lymphatic: No cervical lymphadenopathy Musculoskeletal: Gait intact.   LABS: Results for orders placed  during the hospital encounter of 05/28/12  CBC WITH DIFFERENTIAL      Result Value Range   WBC 8.1  4.0 - 10.5 K/uL   RBC 5.00  4.22 - 5.81 MIL/uL   Hemoglobin 16.1  13.0 - 17.0 g/dL   HCT 40.9  81.1 - 91.4 %   MCV 91.8  78.0 - 100.0 fL   MCH 32.2  26.0 - 34.0 pg   MCHC 35.1  30.0 - 36.0 g/dL   RDW 78.2  95.6 - 21.3 %   Platelets 163  150 - 400 K/uL   Neutrophils Relative % 73  43 - 77 %   Neutro Abs 5.9  1.7 - 7.7 K/uL   Lymphocytes Relative 12  12 - 46 %   Lymphs Abs 1.0  0.7 - 4.0 K/uL   Monocytes Relative 7  3 - 12 %   Monocytes Absolute 0.6  0.1 - 1.0 K/uL   Eosinophils Relative 8 (*) 0 - 5 %   Eosinophils Absolute 0.6  0.0 - 0.7 K/uL   Basophils Relative 0  0 - 1 %   Basophils Absolute 0.0  0.0 - 0.1 K/uL  BASIC METABOLIC PANEL      Result Value Range   Sodium 135  135 - 145 mEq/L   Potassium 3.8  3.5 - 5.1 mEq/L   Chloride 97  96 - 112 mEq/L   CO2 28  19 - 32 mEq/L   Glucose, Bld 116 (*) 70 - 99 mg/dL   BUN 15  6 - 23 mg/dL   Creatinine, Ser 0.86  0.50 - 1.35 mg/dL   Calcium 8.9  8.4 - 57.8 mg/dL   GFR calc non Af Amer 78 (*) >90 mL/min   GFR calc Af Amer >90  >90 mL/min  URINALYSIS, ROUTINE W REFLEX MICROSCOPIC      Result Value Range   Color, Urine YELLOW  YELLOW   APPearance CLEAR  CLEAR   Specific Gravity, Urine >1.030 (*) 1.005 - 1.030   pH 6.0  5.0 - 8.0   Glucose, UA NEGATIVE  NEGATIVE mg/dL   Hgb urine dipstick NEGATIVE  NEGATIVE   Bilirubin Urine SMALL (*) NEGATIVE   Ketones, ur NEGATIVE  NEGATIVE mg/dL   Protein, ur TRACE (*) NEGATIVE mg/dL   Urobilinogen, UA 0.2  0.0 - 1.0 mg/dL   Nitrite NEGATIVE  NEGATIVE   Leukocytes, UA NEGATIVE  NEGATIVE  URINE MICROSCOPIC-ADD ON      Result Value Range   Squamous Epithelial / LPF RARE  RARE   WBC, UA 0-2  <3 WBC/hpf     EKG/XRAY:   Primary read interpreted by Dr. Conley Rolls at [facility].   ASSESSMENT/PLAN: Encounter Diagnoses  Name Primary?  . Mole  of skin Yes  . Laceration    Drysol and lidocaine with epi  stopped the bleeding Pressure dressing F/u prn    Fran Mcree PHUONG, DO 11/29/2012 4:20 PM

## 2012-12-30 ENCOUNTER — Encounter (HOSPITAL_COMMUNITY): Payer: Self-pay | Admitting: Emergency Medicine

## 2012-12-30 ENCOUNTER — Emergency Department (HOSPITAL_COMMUNITY)
Admission: EM | Admit: 2012-12-30 | Discharge: 2012-12-30 | Disposition: A | Discharge status: Home / Self Care | Payer: 59 | Attending: Emergency Medicine | Admitting: Emergency Medicine

## 2012-12-30 DIAGNOSIS — I10 Essential (primary) hypertension: Secondary | ICD-10-CM | POA: Insufficient documentation

## 2012-12-30 DIAGNOSIS — Z7982 Long term (current) use of aspirin: Secondary | ICD-10-CM | POA: Insufficient documentation

## 2012-12-30 DIAGNOSIS — R5381 Other malaise: Secondary | ICD-10-CM | POA: Insufficient documentation

## 2012-12-30 DIAGNOSIS — K219 Gastro-esophageal reflux disease without esophagitis: Secondary | ICD-10-CM | POA: Insufficient documentation

## 2012-12-30 DIAGNOSIS — Z862 Personal history of diseases of the blood and blood-forming organs and certain disorders involving the immune mechanism: Secondary | ICD-10-CM | POA: Insufficient documentation

## 2012-12-30 DIAGNOSIS — F3289 Other specified depressive episodes: Secondary | ICD-10-CM | POA: Insufficient documentation

## 2012-12-30 DIAGNOSIS — Z87891 Personal history of nicotine dependence: Secondary | ICD-10-CM | POA: Insufficient documentation

## 2012-12-30 DIAGNOSIS — Z79899 Other long term (current) drug therapy: Secondary | ICD-10-CM | POA: Insufficient documentation

## 2012-12-30 DIAGNOSIS — R040 Epistaxis: Secondary | ICD-10-CM | POA: Insufficient documentation

## 2012-12-30 DIAGNOSIS — Z8639 Personal history of other endocrine, nutritional and metabolic disease: Secondary | ICD-10-CM | POA: Insufficient documentation

## 2012-12-30 DIAGNOSIS — F329 Major depressive disorder, single episode, unspecified: Secondary | ICD-10-CM | POA: Insufficient documentation

## 2012-12-30 DIAGNOSIS — J45909 Unspecified asthma, uncomplicated: Secondary | ICD-10-CM | POA: Insufficient documentation

## 2012-12-30 LAB — POCT I-STAT, CHEM 8
Creatinine, Ser: 1.2 mg/dL (ref 0.50–1.35)
HCT: 45 % (ref 39.0–52.0)
Hemoglobin: 15.3 g/dL (ref 13.0–17.0)
Potassium: 4.1 mEq/L (ref 3.5–5.1)
Sodium: 139 mEq/L (ref 135–145)

## 2012-12-30 NOTE — ED Notes (Signed)
Pt c/o nosebleed this am that has now stopped

## 2012-12-30 NOTE — ED Provider Notes (Signed)
CSN: [id number]     Arrival date & time 12/30/12  1100 History   First MD Initiated Contact with Patient 12/30/12 1349     Chief Complaint  Patient presents with  . 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 Patient states that the bleeding stopped spontaneously. Denies any recent trauma to the face. Patient is on aspirin but no other blood thinners. The patient has ongoing mild fatigue is no worse today. He denies focal weakness or numbness. Patient denies any blood or melena in this in his stool. No nausea, vomiting, diarrhea. No chest pain, shortness of breath. Past Medical History  Diagnosis Date  . Hypertension   . Depression   . Acid reflux   . Hyperlipidemia   . Insomnia   . Hepatitis     non-viral, r/t ETOH/obesity  . Childhood asthma    Past Surgical History  Procedure Laterality Date  . Knee surgery  1992  . Colonoscopy  May 2010    Dr. Darrick Penna: simple adenoma, normal colon. Repeat 2020  . Upper gastrointestinal endoscopy  May 2010    Dr. Darrick Penna: normal   Family History  Problem Relation Age of Onset  . Colon cancer Neg Hx   . Heart failure Other    History  Substance Use Topics  . Smoking status: Former Smoker -- 3.00 packs/day for 25 years    Types: Cigarettes    Quit date: 01/19/2003  . Smokeless tobacco: Never Used  . Alcohol Use: No     Comment: former    Review of Systems  Constitutional: Positive for fatigue. Negative for fever and chills.  Respiratory: Negative for shortness of breath and wheezing.   Cardiovascular: Negative for chest pain.  Gastrointestinal: Negative for nausea, vomiting and abdominal pain.  Skin: Negative for rash and wound.  Neurological: Negative for dizziness, weakness, light-headedness, numbness and headaches.  All other systems reviewed  and are negative.    Allergies  Daypro and Levofloxacin  Home Medications   Current Outpatient Rx  Name  Route  Sig  Dispense  Refill  . aspirin 325 MG tablet   Oral   Take 325 mg by mouth daily.         Marland Kitchen atenolol (TENORMIN) 25 MG tablet   Oral   Take 25 mg by mouth daily.         . enalapril (VASOTEC) 20 MG tablet   Oral   Take 20 mg by mouth 2 (two) times daily.           Marland Kitchen ibuprofen (ADVIL,MOTRIN) 200 MG tablet   Oral   Take 400 mg by mouth every 6 (six) hours as needed for pain.         Marland Kitchen LORazepam (ATIVAN) 2 MG tablet   Oral   Take 2 mg by mouth at bedtime as needed (sleep).          . Melatonin 3 MG CAPS   Oral   Take 1 capsule by mouth at bedtime.          . Multiple Vitamins-Minerals (ONE-A-DAY MENS 50+ ADVANTAGE PO)   Oral   Take 1 tablet by mouth daily.         Marland Kitchen omeprazole (PRILOSEC) 20 MG capsule   Oral   Take 20 mg by mouth daily.         . verapamil (CALAN-SR)  240 MG CR tablet   Oral   Take 240 mg by mouth at bedtime.         Marland Kitchen FLUoxetine (PROZAC) 40 MG capsule   Oral   Take 40 mg by mouth daily.          BP 144/81  Pulse 62  Temp(Src) 98.2 F (36.8 C) (Oral)  Resp 16  SpO2 95% Physical Exam  Nursing note and vitals reviewed. Constitutional: He is oriented to person, place, and time. He appears well-developed and well-nourished. No distress.  HENT:  Head: Normocephalic and atraumatic.  Mouth/Throat: Oropharynx is clear and moist.  Patient has a large perforation of the septum. No active bleeding at this time. Some of the tissue located at the posterior of the septal perforation is raw appearing.  Eyes: EOM are normal. Pupils are equal, round, and reactive to light.  Neck: Normal range of motion. Neck supple.  Cardiovascular: Normal rate and regular rhythm.   Pulmonary/Chest: Effort normal and breath sounds normal. No respiratory distress. He has no wheezes. He has no rales.  Abdominal: Soft. Bowel sounds are  normal. He exhibits no distension and no mass. There is no tenderness. There is no rebound and no guarding.  Musculoskeletal: Normal range of motion. He exhibits no edema and no tenderness.  Neurological: He is alert and oriented to person, place, and time.  Patient is alert and oriented x3 with clear, goal oriented speech. Patient has 5/5 motor in all extremities. Sensation is intact to light touch. Bilateral finger-to-nose is normal with no signs of dysmetria. Patient has a normal gait and walks without assistance.   Skin: Skin is warm and dry. No rash noted. No erythema.  Psychiatric: He has a normal mood and affect. His behavior is normal.    ED Course  Procedures (including critical care time) Labs Review Labs Reviewed - No data to display Imaging Review No results found.  MDM  No diagnosis found. Patient thoroughly instructed in home a treatment of epistaxis and given thorough return precautions. Persistent nosebleeds patient is referred to an ear nose and throat doctor. We'll do screening labs given patient's ongoing fatigue. Patient understands the need to followup with his primary doctor for these ongoing symptoms.    Loren Racer, MD 12/30/12 [phone]

## 2013-01-20 ENCOUNTER — Other Ambulatory Visit: Payer: Self-pay | Admitting: Family Medicine

## 2013-01-21 ENCOUNTER — Other Ambulatory Visit: Payer: Self-pay | Admitting: *Deleted

## 2013-01-25 ENCOUNTER — Encounter (HOSPITAL_COMMUNITY): Payer: Self-pay | Admitting: *Deleted

## 2013-01-25 ENCOUNTER — Emergency Department (HOSPITAL_COMMUNITY)
Admission: EM | Admit: 2013-01-25 | Discharge: 2013-01-25 | Disposition: A | Discharge status: Home / Self Care | Payer: 59 | Attending: Emergency Medicine | Admitting: Emergency Medicine

## 2013-01-25 DIAGNOSIS — G4700 Insomnia, unspecified: Secondary | ICD-10-CM | POA: Insufficient documentation

## 2013-01-25 DIAGNOSIS — F329 Major depressive disorder, single episode, unspecified: Secondary | ICD-10-CM | POA: Insufficient documentation

## 2013-01-25 DIAGNOSIS — K219 Gastro-esophageal reflux disease without esophagitis: Secondary | ICD-10-CM | POA: Insufficient documentation

## 2013-01-25 DIAGNOSIS — F3289 Other specified depressive episodes: Secondary | ICD-10-CM | POA: Insufficient documentation

## 2013-01-25 DIAGNOSIS — Z7982 Long term (current) use of aspirin: Secondary | ICD-10-CM | POA: Insufficient documentation

## 2013-01-25 DIAGNOSIS — M62838 Other muscle spasm: Secondary | ICD-10-CM | POA: Insufficient documentation

## 2013-01-25 DIAGNOSIS — Z8639 Personal history of other endocrine, nutritional and metabolic disease: Secondary | ICD-10-CM | POA: Insufficient documentation

## 2013-01-25 DIAGNOSIS — J45909 Unspecified asthma, uncomplicated: Secondary | ICD-10-CM | POA: Insufficient documentation

## 2013-01-25 DIAGNOSIS — R259 Unspecified abnormal involuntary movements: Secondary | ICD-10-CM | POA: Insufficient documentation

## 2013-01-25 DIAGNOSIS — Z79899 Other long term (current) drug therapy: Secondary | ICD-10-CM | POA: Insufficient documentation

## 2013-01-25 DIAGNOSIS — Z862 Personal history of diseases of the blood and blood-forming organs and certain disorders involving the immune mechanism: Secondary | ICD-10-CM | POA: Insufficient documentation

## 2013-01-25 DIAGNOSIS — IMO0001 Reserved for inherently not codable concepts without codable children: Secondary | ICD-10-CM | POA: Insufficient documentation

## 2013-01-25 DIAGNOSIS — Z87891 Personal history of nicotine dependence: Secondary | ICD-10-CM | POA: Insufficient documentation

## 2013-01-25 DIAGNOSIS — I10 Essential (primary) hypertension: Secondary | ICD-10-CM | POA: Insufficient documentation

## 2013-01-25 HISTORY — DX: Alcohol abuse, in remission: F10.11

## 2013-01-25 LAB — CBC WITH DIFFERENTIAL/PLATELET
Hemoglobin: 14.3 g/dL (ref 13.0–17.0)
Lymphocytes Relative: 32 % (ref 12–46)
Lymphs Abs: 2.4 10*3/uL (ref 0.7–4.0)
Monocytes Relative: 8 % (ref 3–12)
Neutro Abs: 4 10*3/uL (ref 1.7–7.7)
Neutrophils Relative %: 52 % (ref 43–77)
RBC: 4.68 MIL/uL (ref 4.22–5.81)

## 2013-01-25 LAB — COMPREHENSIVE METABOLIC PANEL
Albumin: 3.7 g/dL (ref 3.5–5.2)
Alkaline Phosphatase: 75 U/L (ref 39–117)
BUN: 9 mg/dL (ref 6–23)
Chloride: 103 mEq/L (ref 96–112)
GFR calc Af Amer: >90 mL/min (ref >90)
Glucose, Bld: 148 mg/dL — ABNORMAL HIGH (ref 70–99)
Potassium: 4 mEq/L (ref 3.5–5.1)
Total Bilirubin: 0.4 mg/dL (ref 0.3–1.2)

## 2013-01-25 MED ORDER — DIAZEPAM 5 MG PO TABS: ORAL_TABLET | ORAL | Status: DC

## 2013-01-25 MED ORDER — DIAZEPAM 5 MG/ML IJ SOLN
10.0000 mg | Freq: Once | INTRAMUSCULAR | Status: AC
  Administered 2013-01-25: 10 mg via INTRAMUSCULAR
  Filled 2013-01-25: qty 2

## 2013-01-25 NOTE — ED Provider Notes (Signed)
CSN: [id number]     Arrival date & time 01/25/13  1333 History  This chart was scribed for Ryan Lennert, MD by Quintella Reichert, ED scribe.  This patient was seen in room APA03/APA03 and the patient's care was started at 2:36 PM.  Chief Complaint  Patient presents with  . generalized pain     Patient is a 52 y.o. male presenting with musculoskeletal pain. The history is provided by the patient. No language interpreter was used.  Muscle Pain This is a new problem. The current episode started more than 1 week ago. The problem occurs rarely. The problem has been gradually worsening. Pertinent negatives include no chest pain, no abdominal pain and no headaches. Nothing aggravates the symptoms. Nothing relieves the symptoms. Treatments tried: Muscle relaxants. The treatment provided no relief.    HPI Comments: Ryan Hickman is a 52 y.o. male with h/o ETOH abuse, HTN, hyperlipidemia and depression who presents to the Emergency Department complaining of generalized muscle "spasm" that began 2 weeks ago but has been progressively-worsening over the past 2 days.  He describes symptoms as "twitching" with associated "electrical shock" pain.  Symptoms are most severe to the arms and hands.  He called his PCP yesterday and was prescribed a muscle relaxant, which he has been taking without relief.  He states he has had similar pain in his head when switching medications but he has not experienced it in his arms.  He has never been evaluated for this.  Pt notes that he switched from Effexor to Seroquel one week ago for help sleeping.  He is a recovering alcohol and has been sober for 4 months, and he notes that in that time he has been working with his PCP to revise his medication regimen.  However he reports he is not taking any medications that he has not taken at some point in the past.  PCP is Dr. Terie Purser   Past Medical History  Diagnosis Date  . Hypertension   . Depression   . Acid reflux    . Hyperlipidemia   . Insomnia   . Childhood asthma   . History of ETOH abuse     Past Surgical History  Procedure Laterality Date  . Knee surgery  1992  . Colonoscopy  May 2010    Dr. Darrick Penna: simple adenoma, normal colon. Repeat 2020  . Upper gastrointestinal endoscopy  May 2010    Dr. Darrick Penna: normal    Family History  Problem Relation Age of Onset  . Colon cancer Neg Hx   . Heart failure Other     History  Substance Use Topics  . Smoking status: Former Smoker -- 3.00 packs/day for 25 years    Types: Cigarettes    Quit date: 01/19/2003  . Smokeless tobacco: Never Used  . Alcohol Use: No     Comment: former    Review of Systems  Constitutional: Negative for appetite change and fatigue.  HENT: Negative for congestion, sinus pressure and ear discharge.   Eyes: Negative for discharge.  Respiratory: Negative for cough.   Cardiovascular: Negative for chest pain.  Gastrointestinal: Negative for abdominal pain and diarrhea.  Genitourinary: Negative for frequency and hematuria.  Musculoskeletal: Positive for myalgias. Negative for back pain.  Skin: Negative for rash.  Neurological: Positive for tremors. Negative for seizures and headaches.  Psychiatric/Behavioral: Negative for hallucinations.      Allergies  Daypro and Levofloxacin  Home Medications   Current Outpatient Rx  Name  Route  Sig  Dispense  Refill  . aspirin EC 81 MG tablet   Oral   Take 81 mg by mouth every morning.         . enalapril (VASOTEC) 20 MG tablet   Oral   Take 20 mg by mouth 2 (two) times daily.           Marland Kitchen LORazepam (ATIVAN) 2 MG tablet   Oral   Take 2 mg by mouth at bedtime as needed (sleep).          . Melatonin 3 MG CAPS   Oral   Take 1 capsule by mouth at bedtime.          . Multiple Vitamins-Minerals (ONE-A-DAY MENS 50+ ADVANTAGE PO)   Oral   Take 1 tablet by mouth daily.         Marland Kitchen omeprazole (PRILOSEC) 20 MG capsule   Oral   Take 20 mg by mouth daily.          . QUEtiapine (SEROQUEL) 25 MG tablet   Oral   Take 25 mg by mouth at bedtime.          . Verapamil HCl CR 300 MG CP24   Oral   Take 300 mg by mouth at bedtime.          BP 131/74  Pulse 78  Temp(Src) 98.7 F (37.1 C) (Oral)  Resp 20  Ht 5\' 10"  (1.778 m)  Wt 269 lb (122.018 kg)  BMI 38.6 kg/m2  SpO2 96%  Physical Exam  Nursing note and vitals reviewed. Constitutional: He is oriented to person, place, and time. He appears well-developed.  HENT:  Head: Normocephalic.  Eyes: Conjunctivae and EOM are normal. No scleral icterus.  Neck: Neck supple. No thyromegaly present.  Cardiovascular: Normal rate and regular rhythm.  Exam reveals no gallop and no friction rub.   No murmur heard. Pulmonary/Chest: No stridor. He has no wheezes. He has no rales. He exhibits no tenderness.  Abdominal: He exhibits no distension. There is no tenderness. There is no rebound.  Musculoskeletal: Normal range of motion. He exhibits no edema.  Lymphadenopathy:    He has no cervical adenopathy.  Neurological: He is oriented to person, place, and time. He displays tremor. He exhibits normal muscle tone. Coordination normal.  Mild tremors, mainly in upper extremities  Skin: No rash noted. No erythema.  Psychiatric: He has a normal mood and affect. His behavior is normal.    ED Course  Procedures (including critical care time)  DIAGNOSTIC STUDIES: Oxygen Saturation is 96% on room air, normal by my interpretation.    COORDINATION OF CARE: 2:41 PM-Discussed treatment plan which includes labs with pt at bedside and pt agreed to plan.    Labs Review Labs Reviewed - No data to display  Imaging Review No results found.  MDM  No diagnosis found.    The chart was scribed for me under my direct supervision.  I personally performed the history, physical, and medical decision making and all procedures in the evaluation of this patient.Ryan Lennert, MD 01/25/13 [phone]

## 2013-01-25 NOTE — ED Notes (Signed)
Patient with no complaints at this time. Respirations even and unlabored. Skin warm/dry. Discharge instructions reviewed with patient at this time. Patient given opportunity to voice concerns/ask questions. Patient discharged at this time and left Emergency Department with steady gait.   

## 2013-01-25 NOTE — ED Notes (Signed)
Dr Zammit at bedside,  

## 2013-01-25 NOTE — ED Notes (Addendum)
Patient c/o "electrical shock" feelings for past 2 weeks which started in his head and has progressed to entire body.  C/O feeling drained and fatigued.  Recovering alcoholic was in treatment in June and has been working w/MD to regulate medications.  Stopped taking Effexor abruptly 2 weeks ago Monday.  Has been started on new sleeping pill. Wife states she thinks his face looks swollen.

## 2013-01-25 NOTE — ED Notes (Signed)
Patient states tremors are "a little" better.

## 2013-06-26 ENCOUNTER — Encounter: Payer: Self-pay | Admitting: Orthopedic Surgery

## 2013-06-26 ENCOUNTER — Ambulatory Visit (INDEPENDENT_AMBULATORY_CARE_PROVIDER_SITE_OTHER): Payer: 59

## 2013-06-26 ENCOUNTER — Ambulatory Visit (INDEPENDENT_AMBULATORY_CARE_PROVIDER_SITE_OTHER): Payer: 59 | Admitting: Orthopedic Surgery

## 2013-06-26 VITALS — BP 162/85 | Ht 70.0 in | Wt 258.0 lb

## 2013-06-26 DIAGNOSIS — M1712 Unilateral primary osteoarthritis, left knee: Secondary | ICD-10-CM

## 2013-06-26 DIAGNOSIS — IMO0002 Reserved for concepts with insufficient information to code with codable children: Secondary | ICD-10-CM

## 2013-06-26 DIAGNOSIS — M23329 Other meniscus derangements, posterior horn of medial meniscus, unspecified knee: Secondary | ICD-10-CM | POA: Insufficient documentation

## 2013-06-26 DIAGNOSIS — M25569 Pain in unspecified knee: Secondary | ICD-10-CM

## 2013-06-26 DIAGNOSIS — M1710 Unilateral primary osteoarthritis, unspecified knee: Secondary | ICD-10-CM

## 2013-06-26 DIAGNOSIS — M25562 Pain in left knee: Secondary | ICD-10-CM

## 2013-06-26 NOTE — Progress Notes (Signed)
Patient ID: Ryan Hickman, male   DOB: 09-26-1960, 53 y.o.   MRN: [id number]  Chief Complaint  Patient presents with  . Knee Pain    Left knee pain, no injury    53 year old male who presents with knee pain involving the left knee. Onset was gradual, starting about 1 year ago. Inciting event: none known. Current symptoms include: crepitus sensation, pain located Diffusely throughout the knee to shin with a throbbing sensation, popping sensation, stiffness and swelling. Pain is aggravated by standing. Patient has had no prior knee problems. Evaluation to date: none. Treatment to date: brace which is somewhat effective, corticosteroid injection which was somewhat effective and prescription NSAIDS which are not very effective. He does get some relief with hydrocodone   The following portions of the patient's history were reviewed and updated as appropriate: allergies, current medications, past family history, past medical history, past social history, past surgical history and problem list.   Review of Systems Pertinent items are noted in HPI.   Objective:    BP 162/85  Ht 5\' 10"  (1.778 m)  Wt 258 lb (117.028 kg)  BMI 37.02 kg/m2  General appearance is normal, the patient is alert and oriented x3 with normal mood and affect.  Right knee: normal and no effusion, full active range of motion, no joint line tenderness, ligamentous structures intact.  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  However, to better treat his current knee problems on the left side it would be good to know whether or not he has a torn meniscus or not. He doesn't have meniscal tear then hydrocodone anti-inflammatories plus or minus injection with Synvisc or cortisone would probably be his best that. MRI scheduled followup after MRI

## 2013-06-26 NOTE — Patient Instructions (Signed)
Continue Hydrocodone/get medication [place]  MRI knee return for results and management

## 2013-07-10 ENCOUNTER — Other Ambulatory Visit: Payer: Self-pay | Admitting: Orthopedic Surgery

## 2013-07-10 ENCOUNTER — Ambulatory Visit (HOSPITAL_COMMUNITY)
Admission: RE | Admit: 2013-07-10 | Discharge: 2013-07-10 | Disposition: A | Discharge status: Home / Self Care | Payer: 59 | Source: Ambulatory Visit | Attending: Orthopedic Surgery | Admitting: Orthopedic Surgery

## 2013-07-10 DIAGNOSIS — M25569 Pain in unspecified knee: Secondary | ICD-10-CM | POA: Insufficient documentation

## 2013-07-10 DIAGNOSIS — M23329 Other meniscus derangements, posterior horn of medial meniscus, unspecified knee: Secondary | ICD-10-CM

## 2013-07-10 DIAGNOSIS — M25469 Effusion, unspecified knee: Secondary | ICD-10-CM | POA: Insufficient documentation

## 2013-07-10 DIAGNOSIS — M7120 Synovial cyst of popliteal space [Baker], unspecified knee: Secondary | ICD-10-CM | POA: Insufficient documentation

## 2013-07-10 DIAGNOSIS — Z1389 Encounter for screening for other disorder: Secondary | ICD-10-CM | POA: Insufficient documentation

## 2013-07-10 DIAGNOSIS — Z77018 Contact with and (suspected) exposure to other hazardous metals: Secondary | ICD-10-CM | POA: Insufficient documentation

## 2013-07-22 ENCOUNTER — Ambulatory Visit (INDEPENDENT_AMBULATORY_CARE_PROVIDER_SITE_OTHER): Payer: 59 | Admitting: Orthopedic Surgery

## 2013-07-22 VITALS — BP 159/106 | Ht 70.0 in | Wt 258.0 lb

## 2013-07-22 DIAGNOSIS — M23329 Other meniscus derangements, posterior horn of medial meniscus, unspecified knee: Secondary | ICD-10-CM

## 2013-07-22 MED ORDER — DICLOFENAC SODIUM 50 MG PO TBEC: 50.0000 mg | DELAYED_RELEASE_TABLET | Freq: Two times a day (BID) | ORAL | Status: DC

## 2013-07-22 NOTE — Patient Instructions (Signed)
If the knee gets worse then we can arrange surgery

## 2013-07-22 NOTE — Progress Notes (Signed)
Patient ID: Ryan Hickman, male   DOB: 1960-12-14, 53 y.o.   MRN: [id number]  Chief Complaint  Patient presents with  . Results    MRI results left knee    Recheck left knee MRI showed torn medial meniscus patient brought in to discuss. He complains of pain popping and cracking but no locking. He takes Vicodin 1-2 tablets during a 24-hour.  Review of systems negative    BP 159/106  Ht 5\' 10"  (1.778 m)  Wt 258 lb (117.028 kg)  BMI 37.02 kg/m2 General appearance is normal, the patient is alert and oriented x3 with normal mood and affect.  His ambulation is normal he has no major swelling in the knee at this time his range of motion is normal his knee is stable. His meniscal signs are positive  His MRI shows torn medial meniscus with some degenerative changes as well  However at this point he cannot be out of work for 6 weeks. I think he would take in 6 weeks to recover because he does a lot of kneeling crawling bending and squatting  Right now he is able to tolerate his symptoms so he will continue on his current treatment course and call us if his knee worsens or he changes his mind about surgery and being out of work 6 weeks

## 2013-11-05 NOTE — H&P (Signed)
  NTS SOAP Note  Vital Signs:  Vitals as of: 4/46/2863: Systolic 817: Diastolic 99: Heart Rate 80: Temp 97.73F: Height 19ft 10in: Weight 253Lbs 0 Ounces: Pain Level 3: BMI 36.3  BMI : 36.3 kg/m2  Subjective: This 53 Years 74 Months old Male presents for of a sebaceous cyst on his chest wall.  Is enlarging is size recenlty and causing him discomfort.  Tender to touch.  Review of Symptoms:  Constitutional:unremarkable   Head:unremarkable    Eyes:unremarkable   Nose/Mouth/Throat:unremarkable Cardiovascular:  unremarkable   Respiratory:unremarkable   Gastrointestinal:  unremarkable   Genitourinary:unremarkable       joint pain Hematolgic/Lymphatic:unremarkable     Allergic/Immunologic:unremarkable     Past Medical History:    Reviewed  Past Medical History  right knee surgery Medical Problems: HTN Allergies: daypro, levaquin Medications: baby asa, fluoxetine, lorazepam, enalapril, omeprazole, hydrocodone, diclofenac tamadol   Social History:Reviewed  Social History  Preferred Language: English Race:  White Ethnicity: Not Hispanic / Latino Age: 53 Years 1 Months Marital Status:  M Alcohol: no   Smoking Status: Never smoker reviewed on 11/04/2013 Functional Status reviewed on 11/04/2013 ------------------------------------------------ Bathing: Normal Cooking: Normal Dressing: Normal Driving: Normal Eating: Normal Managing Meds: Normal Oral Care: Normal Shopping: Normal Toileting: Normal Transferring: Normal Walking: Normal Cognitive Status reviewed on 11/04/2013 ------------------------------------------------ Attention: Normal Decision Making: Normal Language: Normal Memory: Normal Motor: Normal Perception: Normal Problem Solving: Normal Visual and Spatial: Normal   Family History:  Reviewed  Family Health History Mother, Living; Healthy;  Father, Living; Healthy;     Objective Information: General:   Well appearing, well nourished in no distress.      4cm cystic lesion on left upper anterior chest wall.  No drainage present.  Tender to touch.  Slightly erythematous. Heart:  RRR, no murmur Lungs:    CTA bilaterally, no wheezes, rhonchi, rales.  Breathing unlabored.  Assessment:Sebaceous cyst, chest wall  Diagnoses: 706.2 Epidermoid cyst of skin (Sebaceous cyst)  Procedures: 71165 - OFFICE OUTPATIENT NEW 20 MINUTES    Plan:  Scheduled for excision of sebaceous cyst, chest wall on 11/14/13.   Patient Education:Alternative treatments to surgery were discussed with patient (and family).  Risks and benefits  of procedure were fully explained to the patient (and family) who gave informed consent. Patient/family questions were addressed.  Follow-up:Pending Surgery

## 2013-11-07 NOTE — Patient Instructions (Signed)
Ryan Hickman  11/07/2013   Your procedure is scheduled on:  11/14/2013  Report to [facility] at 7:00 AM.  Call this number if you have problems the morning of surgery: [phone]   Remember:   Do not eat food or drink liquids after midnight.   Take these medicines the morning of surgery with A SIP OF WATER: Amlodipine, Voltaren, Vasotec, Prozac, Ativan, Prilosec and verapamil   Do not wear jewelry, make-up or nail polish.  Do not wear lotions, powders, or perfumes. You may wear deodorant.  Do not shave 48 hours prior to surgery. Men may shave face and neck.  Do not bring valuables to the hospital.  [facility] is not responsible for any belongings or valuables.               Contacts, dentures or bridgework may not be worn into surgery.  Leave suitcase in the car. After surgery it may be brought to your room.  For patients admitted to the hospital, discharge time is determined by your                treatment team.               Patients discharged the day of surgery will not be allowed to drive  home.  Name and phone number of your driver:   Special Instructions: Shower using CHG 2 nights before surgery and the night before surgery.  If you shower the day of surgery use CHG.  Use special wash - you have one bottle of CHG for all showers.  You should use approximately 1/3 of the bottle for each shower.   Please read over the following fact sheets that you were given: Surgical Site Infection Prevention, Anesthesia Post-op Instructions and Care and Recovery After Surgery  PATIENT INSTRUCTIONS POST-ANESTHESIA  IMMEDIATELY FOLLOWING SURGERY:  Do not drive or operate machinery for the first twenty four hours after surgery.  Do not make any important decisions for twenty four hours after surgery or while taking narcotic pain medications or sedatives.  If you develop intractable nausea and vomiting or a severe headache please notify your doctor immediately.  FOLLOW-UP:  Please make an  appointment with your surgeon as instructed. You do not need to follow up with anesthesia unless specifically instructed to do so.  WOUND CARE INSTRUCTIONS (if applicable):  Keep a dry clean dressing on the anesthesia/puncture wound site if there is drainage.  Once the wound has quit draining you may leave it open to air.  Generally you should leave the bandage intact for twenty four hours unless there is drainage.  If the epidural site drains for more than 36-48 hours please call the anesthesia department.  QUESTIONS?:  Please feel free to call your physician or the hospital operator if you have any questions, and they will be happy to assist you.      Cyst Removal Your caregiver has removed a cyst. A cyst is a sac containing a semi-solid material. Cysts may occur any place on your body. They may remain small for years or gradually get larger. A sebaceous cyst is an enlarged (dilated) sweat gland filled with old sweat (sebum). Unattended, these may become large (the size of a softball) over several years time. These are often removed for improved appearance (cosmetic) reasons or before they become infected to form an abscess. An abscess is an infected cyst. HOME CARE INSTRUCTIONS   Keep your bandage clean and dry. You may change your  bandage after 24 hours. If your bandage sticks, use warm water to gently loosen it. Pat the area dry with a clean towel before putting on another bandage.  If possible, keep the area where the cyst was removed raised to relieve soreness, swelling, and promote healing.  If you have stitches, keep them clean and dry.  You may clean your stitches gently with a cotton swab dipped in warm soapy water.  Do not soak the area where the cyst was removed or go swimming. You may shower.  Do not overuse the area where your cyst was removed.  Return in 7 days or as directed to have your stitches removed.  Take medicines as instructed by your caregiver. SEEK IMMEDIATE  MEDICAL CARE IF:   An oral temperature above 102 F (38.9 C) develops, not controlled by medication.  Blood continues to soak through the bandage.  You have increasing pain in the area where your cyst was removed.  You have redness, swelling, pus, a bad smell, soreness (inflammation), or red streaks coming away from the stitches. These are signs of infection. MAKE SURE YOU:   Understand these instructions.  Will watch your condition.  Will get help right away if you are not doing well or get worse. Document Released: 04/07/2000 Document Revised: 07/03/2011 Document Reviewed: 08/01/2007 [facility] Patient Information 2015 [place], [place]. This information is not intended to replace advice given to you by your health care provider. Make sure you discuss any questions you have with your health care provider.

## 2013-11-10 ENCOUNTER — Other Ambulatory Visit: Payer: Self-pay

## 2013-11-10 ENCOUNTER — Encounter (HOSPITAL_COMMUNITY): Payer: Self-pay

## 2013-11-10 ENCOUNTER — Encounter (HOSPITAL_COMMUNITY)
Admission: RE | Admit: 2013-11-10 | Discharge: 2013-11-10 | Disposition: A | Discharge status: Home / Self Care | Payer: 59 | Source: Ambulatory Visit | Attending: General Surgery | Admitting: General Surgery

## 2013-11-10 DIAGNOSIS — Z01812 Encounter for preprocedural laboratory examination: Secondary | ICD-10-CM | POA: Diagnosis not present

## 2013-11-10 DIAGNOSIS — Z01810 Encounter for preprocedural cardiovascular examination: Secondary | ICD-10-CM | POA: Diagnosis present

## 2013-11-10 LAB — BASIC METABOLIC PANEL
Anion gap: 14 (ref 5–15)
BUN: 15 mg/dL (ref 6–23)
CO2: 25 mEq/L (ref 19–32)
Calcium: 10.2 mg/dL (ref 8.4–10.5)
Chloride: 103 mEq/L (ref 96–112)
Creatinine, Ser: 0.87 mg/dL (ref 0.50–1.35)
Glucose, Bld: 137 mg/dL — ABNORMAL HIGH (ref 70–99)
POTASSIUM: 4.1 meq/L (ref 3.7–5.3)
SODIUM: 142 meq/L (ref 137–147)

## 2013-11-10 LAB — CBC WITH DIFFERENTIAL/PLATELET
Basophils Absolute: 0 10*3/uL (ref 0.0–0.1)
Basophils Relative: 0 % (ref 0–1)
EOS ABS: 0.5 10*3/uL (ref 0.0–0.7)
Eosinophils Relative: 4 % (ref 0–5)
HCT: 45.3 % (ref 39.0–52.0)
Hemoglobin: 15.7 g/dL (ref 13.0–17.0)
LYMPHS PCT: 17 % (ref 12–46)
Lymphs Abs: 2.2 10*3/uL (ref 0.7–4.0)
MCH: 30.1 pg (ref 26.0–34.0)
MCHC: 34.7 g/dL (ref 30.0–36.0)
MCV: 86.8 fL (ref 78.0–100.0)
Monocytes Absolute: 0.7 10*3/uL (ref 0.1–1.0)
Monocytes Relative: 5 % (ref 3–12)
NEUTROS PCT: 74 % (ref 43–77)
Neutro Abs: 9.7 10*3/uL — ABNORMAL HIGH (ref 1.7–7.7)
PLATELETS: 206 10*3/uL (ref 150–400)
RBC: 5.22 MIL/uL (ref 4.22–5.81)
RDW: 13.5 % (ref 11.5–15.5)
WBC: 13.1 10*3/uL — ABNORMAL HIGH (ref 4.0–10.5)

## 2013-11-10 MED ORDER — CHLORHEXIDINE GLUCONATE 4 % EX LIQD: 1.0000 "application " | Freq: Once | CUTANEOUS | Status: DC

## 2013-11-10 NOTE — Pre-Procedure Instructions (Signed)
12 lead EKG shown to Dr. Patsey Berthold. No new orders received.

## 2013-11-14 ENCOUNTER — Ambulatory Visit (HOSPITAL_COMMUNITY)
Admission: RE | Admit: 2013-11-14 | Discharge: 2013-11-14 | Disposition: A | Discharge status: Home / Self Care | Payer: 59 | Source: Ambulatory Visit | Attending: General Surgery | Admitting: General Surgery

## 2013-11-14 ENCOUNTER — Ambulatory Visit (HOSPITAL_COMMUNITY): Payer: 59 | Admitting: Anesthesiology

## 2013-11-14 ENCOUNTER — Encounter (HOSPITAL_COMMUNITY): Payer: Self-pay | Admitting: Emergency Medicine

## 2013-11-14 ENCOUNTER — Encounter (HOSPITAL_COMMUNITY)
Admission: RE | Disposition: A | Discharge status: Home / Self Care | Payer: Self-pay | Source: Ambulatory Visit | Attending: General Surgery

## 2013-11-14 ENCOUNTER — Encounter (HOSPITAL_COMMUNITY): Payer: 59 | Admitting: Anesthesiology

## 2013-11-14 DIAGNOSIS — J45909 Unspecified asthma, uncomplicated: Secondary | ICD-10-CM | POA: Insufficient documentation

## 2013-11-14 DIAGNOSIS — G4730 Sleep apnea, unspecified: Secondary | ICD-10-CM | POA: Diagnosis not present

## 2013-11-14 DIAGNOSIS — Z7982 Long term (current) use of aspirin: Secondary | ICD-10-CM | POA: Insufficient documentation

## 2013-11-14 DIAGNOSIS — I10 Essential (primary) hypertension: Secondary | ICD-10-CM | POA: Diagnosis not present

## 2013-11-14 DIAGNOSIS — F329 Major depressive disorder, single episode, unspecified: Secondary | ICD-10-CM | POA: Insufficient documentation

## 2013-11-14 DIAGNOSIS — K219 Gastro-esophageal reflux disease without esophagitis: Secondary | ICD-10-CM | POA: Diagnosis not present

## 2013-11-14 DIAGNOSIS — F3289 Other specified depressive episodes: Secondary | ICD-10-CM | POA: Insufficient documentation

## 2013-11-14 DIAGNOSIS — Z79899 Other long term (current) drug therapy: Secondary | ICD-10-CM | POA: Diagnosis not present

## 2013-11-14 DIAGNOSIS — L723 Sebaceous cyst: Secondary | ICD-10-CM | POA: Diagnosis not present

## 2013-11-14 DIAGNOSIS — Z87891 Personal history of nicotine dependence: Secondary | ICD-10-CM | POA: Insufficient documentation

## 2013-11-14 DIAGNOSIS — R222 Localized swelling, mass and lump, trunk: Secondary | ICD-10-CM | POA: Insufficient documentation

## 2013-11-14 HISTORY — PX: EAR CYST EXCISION: SHX22

## 2013-11-14 SURGERY — CYST REMOVAL
Anesthesia: Monitor Anesthesia Care | Site: Chest

## 2013-11-14 MED ORDER — OXYCODONE-ACETAMINOPHEN 7.5-325 MG PO TABS: 1.0000 | ORAL_TABLET | ORAL | Status: DC | PRN

## 2013-11-14 MED ORDER — MIDAZOLAM HCL 5 MG/5ML IJ SOLN
INTRAMUSCULAR | Status: DC | PRN
  Administered 2013-11-14 (×2): 1 mg via INTRAVENOUS

## 2013-11-14 MED ORDER — PROPOFOL INFUSION 10 MG/ML OPTIME
INTRAVENOUS | Status: DC | PRN
  Administered 2013-11-14 (×2): 100 ug/kg/min via INTRAVENOUS

## 2013-11-14 MED ORDER — FENTANYL CITRATE 0.05 MG/ML IJ SOLN
INTRAMUSCULAR | Status: AC
  Filled 2013-11-14: qty 2

## 2013-11-14 MED ORDER — MIDAZOLAM HCL 2 MG/2ML IJ SOLN
1.0000 mg | INTRAMUSCULAR | Status: DC | PRN
  Administered 2013-11-14 (×2): 1 mg via INTRAVENOUS

## 2013-11-14 MED ORDER — MIDAZOLAM HCL 2 MG/2ML IJ SOLN
INTRAMUSCULAR | Status: AC
  Filled 2013-11-14: qty 2

## 2013-11-14 MED ORDER — POVIDONE-IODINE 10 % EX OINT
TOPICAL_OINTMENT | CUTANEOUS | Status: AC
  Filled 2013-11-14: qty 2

## 2013-11-14 MED ORDER — ONDANSETRON HCL 4 MG/2ML IJ SOLN
INTRAMUSCULAR | Status: AC
  Filled 2013-11-14: qty 2

## 2013-11-14 MED ORDER — PROPOFOL 10 MG/ML IV EMUL
INTRAVENOUS | Status: AC
  Filled 2013-11-14: qty 20

## 2013-11-14 MED ORDER — BUPIVACAINE HCL (PF) 0.5 % IJ SOLN
INTRAMUSCULAR | Status: AC
  Filled 2013-11-14: qty 30

## 2013-11-14 MED ORDER — LIDOCAINE HCL (PF) 1 % IJ SOLN
INTRAMUSCULAR | Status: AC
  Filled 2013-11-14: qty 5

## 2013-11-14 MED ORDER — LIDOCAINE HCL (PF) 1 % IJ SOLN
INTRAMUSCULAR | Status: AC
  Filled 2013-11-14: qty 30

## 2013-11-14 MED ORDER — CEFAZOLIN SODIUM-DEXTROSE 2-3 GM-% IV SOLR
2.0000 g | INTRAVENOUS | Status: AC
  Administered 2013-11-14: 2 g via INTRAVENOUS
  Filled 2013-11-14: qty 50

## 2013-11-14 MED ORDER — SODIUM CHLORIDE 0.9 % IR SOLN
Status: DC | PRN
  Administered 2013-11-14: 1000 mL

## 2013-11-14 MED ORDER — PROPOFOL 10 MG/ML IV BOLUS
INTRAVENOUS | Status: DC | PRN
  Administered 2013-11-14: 12.5 mg via INTRAVENOUS

## 2013-11-14 MED ORDER — ONDANSETRON HCL 4 MG/2ML IJ SOLN
4.0000 mg | Freq: Once | INTRAMUSCULAR | Status: AC
  Administered 2013-11-14: 4 mg via INTRAVENOUS
  Filled 2013-11-14: qty 2

## 2013-11-14 MED ORDER — LIDOCAINE HCL 1 % IJ SOLN
INTRAMUSCULAR | Status: DC | PRN
  Administered 2013-11-14: 14 mL

## 2013-11-14 MED ORDER — GLYCOPYRROLATE 0.2 MG/ML IJ SOLN
INTRAMUSCULAR | Status: AC
  Filled 2013-11-14: qty 1

## 2013-11-14 MED ORDER — GLYCOPYRROLATE 0.2 MG/ML IJ SOLN
0.2000 mg | Freq: Once | INTRAMUSCULAR | Status: AC
  Administered 2013-11-14: 0.2 mg via INTRAVENOUS

## 2013-11-14 MED ORDER — LACTATED RINGERS IV SOLN
INTRAVENOUS | Status: DC
  Administered 2013-11-14: 08:00:00 via INTRAVENOUS

## 2013-11-14 MED ORDER — FENTANYL CITRATE 0.05 MG/ML IJ SOLN
25.0000 ug | INTRAMUSCULAR | Status: AC
  Administered 2013-11-14: 25 ug via INTRAVENOUS

## 2013-11-14 MED ORDER — LIDOCAINE HCL (CARDIAC) 10 MG/ML IV SOLN
INTRAVENOUS | Status: DC | PRN
  Administered 2013-11-14: 10 mg via INTRAVENOUS

## 2013-11-14 MED ORDER — FENTANYL CITRATE 0.05 MG/ML IJ SOLN
INTRAMUSCULAR | Status: DC | PRN
  Administered 2013-11-14: 25 ug via INTRAVENOUS

## 2013-11-14 SURGICAL SUPPLY — 38 items
ADH SKN CLS APL DERMABOND .7 (GAUZE/BANDAGES/DRESSINGS)
BAG HAMPER (MISCELLANEOUS) ×3 IMPLANT
CLOTH BEACON ORANGE TIMEOUT ST (SAFETY) ×3 IMPLANT
COVER LIGHT HANDLE STERIS (MISCELLANEOUS) ×6 IMPLANT
DECANTER SPIKE VIAL GLASS SM (MISCELLANEOUS) ×3 IMPLANT
DERMABOND ADVANCED (GAUZE/BANDAGES/DRESSINGS)
DERMABOND ADVANCED .7 DNX12 (GAUZE/BANDAGES/DRESSINGS) IMPLANT
DRAPE EENT ADH APERT 31X51 STR (DRAPES) ×3 IMPLANT
DRSG TEGADERM 4X4.75 (GAUZE/BANDAGES/DRESSINGS) ×2 IMPLANT
ELECT NDL TIP 2.8 STRL (NEEDLE) IMPLANT
ELECT NEEDLE TIP 2.8 STRL (NEEDLE) IMPLANT
ELECT REM PT RETURN 9FT ADLT (ELECTROSURGICAL) ×3
ELECTRODE REM PT RTRN 9FT ADLT (ELECTROSURGICAL) ×1 IMPLANT
FORMALIN 10 PREFIL 120ML (MISCELLANEOUS) ×3 IMPLANT
GLOVE BIOGEL PI IND STRL 7.0 (GLOVE) IMPLANT
GLOVE BIOGEL PI INDICATOR 7.0 (GLOVE) ×2
GLOVE ECLIPSE 6.5 STRL STRAW (GLOVE) ×2 IMPLANT
GLOVE EXAM NITRILE LRG STRL (GLOVE) ×2 IMPLANT
GLOVE SURG SS PI 7.5 STRL IVOR (GLOVE) ×6 IMPLANT
GOWN STRL REUS W/TWL LRG LVL3 (GOWN DISPOSABLE) ×6 IMPLANT
KIT ROOM TURNOVER APOR (KITS) ×3 IMPLANT
MANIFOLD NEPTUNE II (INSTRUMENTS) ×3 IMPLANT
NDL HYPO 25X1 1.5 SAFETY (NEEDLE) ×1 IMPLANT
NEEDLE HYPO 25X1 1.5 SAFETY (NEEDLE) ×3 IMPLANT
NS IRRIG 1000ML POUR BTL (IV SOLUTION) ×3 IMPLANT
PACK MINOR (CUSTOM PROCEDURE TRAY) IMPLANT
PAD ARMBOARD 7.5X6 YLW CONV (MISCELLANEOUS) ×3 IMPLANT
SET BASIN LINEN APH (SET/KITS/TRAYS/PACK) ×3 IMPLANT
SPONGE GAUZE 2X2 8PLY STER LF (GAUZE/BANDAGES/DRESSINGS) ×1
SPONGE GAUZE 2X2 8PLY STRL LF (GAUZE/BANDAGES/DRESSINGS) ×1 IMPLANT
SUT ETHILON 3 0 FSL (SUTURE) IMPLANT
SUT PROLENE 3 0 PS 2 (SUTURE) ×2 IMPLANT
SUT PROLENE 4 0 PS 2 18 (SUTURE) IMPLANT
SUT VIC AB 3-0 SH 27 (SUTURE) ×3
SUT VIC AB 3-0 SH 27X BRD (SUTURE) IMPLANT
SUT VIC AB 4-0 PS2 27 (SUTURE) IMPLANT
SYRINGE CONTROL L 12CC (SYRINGE) ×3 IMPLANT
SYRINGE CONTROL LL 12CC (SYRINGE) ×1 IMPLANT

## 2013-11-14 NOTE — Transfer of Care (Signed)
Immediate Anesthesia Transfer of Care Note  Patient: Ryan Hickman  Procedure(s) Performed: Procedure(s) (LRB): EXCISION SEBACEOUS CYST CHEST WALL (N/A)  Patient Location: PACU  Anesthesia Type: MAC  Level of Consciousness: awake  Airway & Oxygen Therapy: Patient Spontanous Breathing. Nasal cannula  Post-op Assessment: Report given to PACU RN, Post -op Vital signs reviewed and stable and Patient moving all extremities  Post vital signs: Reviewed and stable  Complications: No apparent anesthesia complications

## 2013-11-14 NOTE — Anesthesia Preprocedure Evaluation (Signed)
Anesthesia Evaluation  Patient identified by MRN, date of birth, ID band Patient awake    Reviewed: Allergy & Precautions, H&P , NPO status , Patient's Chart, lab work & pertinent test results  Airway       Dental   Pulmonary asthma , sleep apnea , former smoker,          Cardiovascular hypertension, Pt. on medications     Neuro/Psych PSYCHIATRIC DISORDERS Depression    GI/Hepatic GERD-  Medicated and Controlled,(+)     substance abuse  alcohol use,   Endo/Other  Morbid obesity  Renal/GU      Musculoskeletal   Abdominal   Peds  Hematology   Anesthesia Other Findings   Reproductive/Obstetrics                           Anesthesia Physical Anesthesia Plan  ASA: III  Anesthesia Plan: MAC   Post-op Pain Management:    Induction: Intravenous  Airway Management Planned: Simple Face Mask  Additional Equipment:   Intra-op Plan:   Post-operative Plan:   Informed Consent: I have reviewed the patients History and Physical, chart, labs and discussed the procedure including the risks, benefits and alternatives for the proposed anesthesia with the patient or authorized representative who has indicated his/her understanding and acceptance.     Plan Discussed with:   Anesthesia Plan Comments:         Anesthesia Quick Evaluation

## 2013-11-14 NOTE — Anesthesia Postprocedure Evaluation (Signed)
Anesthesia Post Note  Patient: Ryan Hickman  Procedure(s) Performed: Procedure(s) (LRB): EXCISION SEBACEOUS CYST CHEST WALL (N/A)  Anesthesia type: MAC  Patient location: PACU  Post pain: Pain level controlled  Post assessment: Post-op Vital signs reviewed, Patient's Cardiovascular Status Stable, Respiratory Function Stable, Patent Airway, No signs of Nausea or vomiting and Pain level controlled  Last Vitals:  Filed Vitals:   11/14/13 0926  BP: 108/66  Pulse:   Temp: 36.6 C  Resp: 12    Post vital signs: Reviewed and stable  Level of consciousness: awake and alert   Complications: No apparent anesthesia complications

## 2013-11-14 NOTE — Anesthesia Procedure Notes (Signed)
Procedure Name: MAC Date/Time: 11/14/2013 8:34 AM Performed by: Vista Deck Pre-anesthesia Checklist: Patient identified, Emergency Drugs available, Suction available, Timeout performed and Patient being monitored Patient Re-evaluated:Patient Re-evaluated prior to inductionOxygen Delivery Method: Non-rebreather mask

## 2013-11-14 NOTE — Discharge Instructions (Signed)
Cyst Removal   Your caregiver has removed a cyst. A cyst is a sac containing a semi-solid material. Cysts may occur any place on your body. They may remain small for years or gradually get larger. A sebaceous cyst is an enlarged (dilated) sweat gland filled with old sweat (sebum). Unattended, these may become large (the size of a softball) over several years time. These are often removed for improved appearance (cosmetic) reasons or before they become infected to form an abscess. An abscess is an infected cyst.   HOME CARE INSTRUCTIONS   Keep your bandage clean and dry. You may change your bandage after 24 hours. If your bandage sticks, use warm water to gently loosen it. Pat the area dry with a clean towel before putting on another bandage.   If possible, keep the area where the cyst was removed raised to relieve soreness, swelling, and promote healing.   If you have stitches, keep them clean and dry.   You may clean your stitches gently with a cotton swab dipped in warm soapy water.   Do not soak the area where the cyst was removed or go swimming. You may shower.   Do not overuse the area where your cyst was removed.   Return in 7 days or as directed to have your stitches removed.   Take medicines as instructed by your caregiver.  SEEK IMMEDIATE MEDICAL CARE IF:   An oral temperature above 102° F (38.9° C) develops, not controlled by medication.   Blood continues to soak through the bandage.   You have increasing pain in the area where your cyst was removed.   You have redness, swelling, pus, a bad smell, soreness (inflammation), or red streaks coming away from the stitches. These are signs of infection.  MAKE SURE YOU:   Understand these instructions.   Will watch your condition.   Will get help right away if you are not doing well or get worse.  Document Released: 04/07/2000 Document Revised: 07/03/2011 Document Reviewed: 08/01/2007   [employer]® Patient Information ©2015 [employer]. This information is not  intended to replace advice given to you by your health care provider. Make sure you discuss any questions you have with your health care provider.

## 2013-11-14 NOTE — Interval H&P Note (Signed)
History and Physical Interval Note:  11/14/2013 8:05 AM  Ryan Hickman  has presented today for surgery, with the diagnosis of sebaceous cyst on chest wall  The various methods of treatment have been discussed with the patient and family. After consideration of risks, benefits and other options for treatment, the patient has consented to  Procedure(s): EXCISION SEBACEOUS CYST CHEST WALL (N/A) as a surgical intervention .  The patient's history has been reviewed, patient examined, no change in status, stable for surgery.  I have reviewed the patient's chart and labs.  Questions were answered to the patient's satisfaction.     Aviva Signs A

## 2013-11-14 NOTE — Op Note (Addendum)
Patient:  Ryan Hickman  DOB:  11/09/1960  MRN:  [id number]   Preop Diagnosis:  Sebaceous cyst, 4 cm, chest wall  Postop Diagnosis:  Same  Procedure:  Excision of sebaceous cyst, chest wall  Surgeon:  Aviva Signs, M.D.  Anes:  MAC  Indications:  Patient is a 53 year old white male who presents with an enlarging tender cystic lesion along the anterior left upper chest wall. The risks and benefits of the procedure were fully explained to the patient, who gave informed consent.  Procedure note:  The patient is placed the supine position. After monitored anesthesia care was given, the left upper chest was prepped and draped using usual sterile technique with DuraPrep. Surgical site confirmation was performed. 1% Xylocaine was used for local anesthesia.  An incision was made over the sebaceous cyst. The cyst along with its wall were removed without difficulty. It was a sebaceous cysts, thus it was disposed of. The wound is irrigated normal saline. Subcutaneous layer was reapproximated using 3-0 Vicryl interrupted suture. The skin was closed using 4-0 Prolene interrupted sutures. The betadine ointment and a dry sterile dressing were applied.  All tape and needle counts were correct at the end of the procedure. Patient was transferred to PACU in stable condition.  Complications:  None  EBL:  Minimal  Specimen:  none

## 2013-11-17 ENCOUNTER — Encounter (HOSPITAL_COMMUNITY): Payer: Self-pay | Admitting: General Surgery

## 2013-12-03 ENCOUNTER — Ambulatory Visit (INDEPENDENT_AMBULATORY_CARE_PROVIDER_SITE_OTHER): Payer: 59

## 2013-12-03 ENCOUNTER — Ambulatory Visit (INDEPENDENT_AMBULATORY_CARE_PROVIDER_SITE_OTHER): Payer: 59 | Admitting: Family Medicine

## 2013-12-03 VITALS — BP 140/80 | HR 92 | Temp 98.2°F | Resp 18 | Ht 68.5 in | Wt 249.2 lb

## 2013-12-03 DIAGNOSIS — M545 Low back pain, unspecified: Secondary | ICD-10-CM

## 2013-12-03 DIAGNOSIS — R3911 Hesitancy of micturition: Secondary | ICD-10-CM

## 2013-12-03 DIAGNOSIS — D72829 Elevated white blood cell count, unspecified: Secondary | ICD-10-CM

## 2013-12-03 LAB — POCT URINALYSIS DIPSTICK
Blood, UA: NEGATIVE
Glucose, UA: NEGATIVE
Leukocytes, UA: NEGATIVE
Nitrite, UA: NEGATIVE
Spec Grav, UA: 1.025
Urobilinogen, UA: 0.2
pH, UA: 6

## 2013-12-03 LAB — POCT CBC
Granulocyte percent: 65.1 %G (ref 37–80)
HCT, POC: 45.3 % (ref 43.5–53.7)
Hemoglobin: 14.9 g/dL (ref 14.1–18.1)
Lymph, poc: 3.4 (ref 0.6–3.4)
MCH, POC: 29.8 pg (ref 27–31.2)
MCHC: 32.9 g/dL (ref 31.8–35.4)
MCV: 90.5 fL (ref 80–97)
MID (cbc): 0.7 (ref 0–0.9)
MPV: 7.9 fL (ref 0–99.8)
POC Granulocyte: 7.6 — AB (ref 2–6.9)
POC LYMPH PERCENT: 29 % (ref 10–50)
POC MID %: 5.9 % (ref 0–12)
Platelet Count, POC: 243 10*3/uL (ref 142–424)
RBC: 5 M/uL (ref 4.69–6.13)
RDW, POC: 14.9 %
WBC: 11.7 10*3/uL — AB (ref 4.6–10.2)

## 2013-12-03 LAB — COMPLETE METABOLIC PANEL WITH GFR
ALT: 25 U/L (ref 0–53)
AST: 21 U/L (ref 0–37)
Calcium: 9.5 mg/dL (ref 8.4–10.5)
Chloride: 105 mEq/L (ref 96–112)
Creat: 0.87 mg/dL (ref 0.50–1.35)
Sodium: 139 mEq/L (ref 135–145)
Total Bilirubin: 0.4 mg/dL (ref 0.2–1.2)
Total Protein: 7.2 g/dL (ref 6.0–8.3)

## 2013-12-03 LAB — COMPLETE METABOLIC PANEL WITHOUT GFR
Albumin: 4.7 g/dL (ref 3.5–5.2)
Alkaline Phosphatase: 85 U/L (ref 39–117)
BUN: 14 mg/dL (ref 6–23)
CO2: 25 meq/L (ref 19–32)
GFR, Est African American: >89 mL/min
GFR, Est Non African American: >89 mL/min
Glucose, Bld: 73 mg/dL (ref 70–99)
Potassium: 4.1 meq/L (ref 3.5–5.3)

## 2013-12-03 LAB — POCT UA - MICROSCOPIC ONLY
Bacteria, U Microscopic: NEGATIVE
Casts, Ur, LPF, POC: NEGATIVE
Crystals, Ur, HPF, POC: NEGATIVE
Epithelial cells, urine per micros: NEGATIVE
Mucus, UA: POSITIVE
Yeast, UA: NEGATIVE

## 2013-12-03 MED ORDER — OXYCODONE-ACETAMINOPHEN 7.5-325 MG PO TABS: 1.0000 | ORAL_TABLET | Freq: Three times a day (TID) | ORAL | Status: DC | PRN

## 2013-12-03 MED ORDER — SULFAMETHOXAZOLE-TMP DS 800-160 MG PO TABS: 1.0000 | ORAL_TABLET | Freq: Two times a day (BID) | ORAL | Status: DC

## 2013-12-03 NOTE — Progress Notes (Signed)
Chief Complaint:  Chief Complaint  Patient presents with  . Back Pain    Lower, X Saturday    HPI: Ryan Hickman is a 53 y.o. male who is here for 1 day history of low back pain on right side , he was mowing grass and sitting on his sitting lawn mower, when he felt a sharp aching pain, he has pain now on the other side. He doe snot feel like it is msk pain, he has had msk pain in the past and this does not feel like it. He has never had kidney pain/stones. No fevers or chills, he has been sweating because of pain. He ahs to work so he has been sweating. Hr hurts so bad that it makes him sweat He has been having difficulties urinating, primarily some hesitancy and increase frequency at night He has not had any hematuria, has no pain.  Sometimes the back pain is sharp , sometimes it is dull. Moving make sit worse. He has tried hydrcodone everyt morning that he has for torn meniscus and took it this morning and does not seem to affect it.  Has to go 3-4 times a night, has been on diclofenac for last 3 months.  He has had normal PSAs with PCP each time, every year. He denies prostatitis, deneis BPH  He has a hsitoy of alcohol abuse, has been sober for 1 year now, he denies abuse of narcotic pain meds He also has a history of minimally elevated liver enzymes, they have been normal per patient since he has been tracked by his PCP.    Past Medical History  Diagnosis Date  . Hypertension   . Depression   . Acid reflux   . Hyperlipidemia   . Insomnia   . Childhood asthma   . History of ETOH abuse    Past Surgical History  Procedure Laterality Date  . Knee surgery  1992  . Colonoscopy  May 2010    Dr. Oneida Alar: simple adenoma, normal colon. Repeat 2020  . Upper gastrointestinal endoscopy  May 2010    Dr. Oneida Alar: normal  . Ear cyst excision N/A 11/14/2013    Procedure: EXCISION SEBACEOUS CYST CHEST WALL;  Surgeon: Jamesetta So, MD;  Location: [facility];  Service: General;   Laterality: N/A;   History   Social History  . Marital Status: Married    Spouse Name: N/A    Number of Children: N/A  . Years of Education: N/A   Social History Main Topics  . Smoking status: Former Smoker -- 3.00 packs/day for 25 years    Types: Cigarettes    Quit date: 01/19/2003  . Smokeless tobacco: Never Used  . Alcohol Use: No     Comment: former  . Drug Use: No  . Sexual Activity: None   Other Topics Concern  . None   Social History Narrative  . None   Family History  Problem Relation Age of Onset  . Colon cancer Neg Hx   . Heart failure Other    Allergies  Allergen Reactions  . Daypro [Oxaprozin] Anaphylaxis  . Levofloxacin Hives   Prior to Admission medications   Medication Sig Start Date End Date Taking? Authorizing Provider  amLODipine (NORVASC) 5 MG tablet Take 5 mg by mouth daily.   Yes Historical Provider, MD  aspirin EC 81 MG tablet Take 81 mg by mouth every morning.   Yes Historical Provider, MD  atorvastatin (LIPITOR) 10 MG tablet Take  10 mg by mouth daily.   Yes Historical Provider, MD  diclofenac (VOLTAREN) 50 MG EC tablet Take 1 tablet (50 mg total) by mouth 2 (two) times daily. 07/22/13  Yes Carole Civil, MD  enalapril (VASOTEC) 20 MG tablet Take 20 mg by mouth 2 (two) times daily.     Yes Historical Provider, MD  FLUoxetine (PROZAC) 40 MG capsule Take 40 mg by mouth daily.   Yes Historical Provider, MD  LORazepam (ATIVAN) 2 MG tablet Take 2-3 mg by mouth at bedtime as needed (sleep).    Yes Historical Provider, MD  Multiple Vitamins-Minerals (ONE-A-DAY MENS 50+ ADVANTAGE PO) Take 1 tablet by mouth daily.   Yes Historical Provider, MD  omeprazole (PRILOSEC) 20 MG capsule Take 20 mg by mouth daily.   Yes Historical Provider, MD  verapamil (VERELAN PM) 360 MG 24 hr capsule Take 360 mg by mouth at bedtime.   Yes Historical Provider, MD  oxyCODONE-acetaminophen (PERCOCET) 7.5-325 MG per tablet Take 1-2 tablets by mouth every 4 (four) hours as  needed. 11/14/13   Jamesetta So, MD     ROS: The patient denies fevers, chills, night sweats, unintentional weight loss, chest pain, palpitations, wheezing, dyspnea on exertion, nausea, vomiting, abdominal pain, , hematuria, melena, numbness, weakness, or tingling.  All other systems have been reviewed and were otherwise negative with the exception of those mentioned in the HPI and as above.    PHYSICAL EXAM: Filed Vitals:   12/03/13 1125  BP: 140/80  Pulse: 92  Temp: 98.2 F (36.8 C)  Resp: 18   Filed Vitals:   12/03/13 1125  Height: 5' 8.5" (1.74 m)  Weight: 249 lb 3.2 oz (113.036 kg)   Body mass index is 37.34 kg/(m^2).  General: Alert, no acute distress HEENT:  Normocephalic, atraumatic, oropharynx patent. EOMI, PERRLA Cardiovascular:  Regular rate and rhythm, no rubs murmurs or gallops.  No Carotid bruits, radial pulse intact. No pedal edema.  Respiratory: Clear to auscultation bilaterally.  No wheezes, rales, or rhonchi.  No cyanosis, no use of accessory musculature GI: No organomegaly, abdomen is soft and non-tender, positive bowel sounds.  No masses. Skin: No rashes. Neurologic: Facial musculature symmetric. Psychiatric: Patient is appropriate throughout our interaction. Lymphatic: No cervical lymphadenopathy Musculoskeletal: Gait intact. + paramsk tenderness localized low back -right and left Full ROM 5/5 strength, 2/2 DTRs No saddle anesthesia Straight leg +/- Hip and knee exam--normal    LABS: Results for orders placed in visit on 12/03/13  POCT CBC      Result Value Ref Range   WBC 11.7 (*) 4.6 - 10.2 K/uL   Lymph, poc 3.4  0.6 - 3.4   POC LYMPH PERCENT 29.0  10 - 50 %L   MID (cbc) 0.7  0 - 0.9   POC MID % 5.9  0 - 12 %M   POC Granulocyte 7.6 (*) 2 - 6.9   Granulocyte percent 65.1  37 - 80 %G   RBC 5.00  4.69 - 6.13 M/uL   Hemoglobin 14.9  14.1 - 18.1 g/dL   HCT, POC 45.3  43.5 - 53.7 %   MCV 90.5  80 - 97 fL   MCH, POC 29.8  27 - 31.2 pg   MCHC  32.9  31.8 - 35.4 g/dL   RDW, POC 14.9     Platelet Count, POC 243  142 - 424 K/uL   MPV 7.9  0 - 99.8 fL  POCT UA - MICROSCOPIC ONLY  Result Value Ref Range   WBC, Ur, HPF, POC 0-4     RBC, urine, microscopic 2-8     Bacteria, U Microscopic neg     Mucus, UA positive     Epithelial cells, urine per micros neg     Crystals, Ur, HPF, POC neg     Casts, Ur, LPF, POC neg     Yeast, UA neg    POCT URINALYSIS DIPSTICK      Result Value Ref Range   Color, UA dark yellow     Clarity, UA clear     Glucose, UA neg     Bilirubin, UA small     Ketones, UA trace     Spec Grav, UA 1.025     Blood, UA neg     pH, UA 6.0     Protein, UA trace     Urobilinogen, UA 0.2     Nitrite, UA neg     Leukocytes, UA Negative       EKG/XRAY:   Primary read interpreted by Dr. Marin Comment at [facility]. Neg for fracture or dislocation   ASSESSMENT/PLAN: Encounter Diagnoses  Name Primary?  . Bilateral low back pain without sciatica Yes  . Urinary hesitancy   . Leukocytosis, unspecified     ? Etiology of back pain with minimal leukocytosis with normal UA. ?  Inflammatory or infection Patient feels it is not msk , he has had back pain in the past and does not feel like it Will rx percocet, advise to stop hydrocodone whil eon this CBC had slightly elevated wbc , but urine was pretty much within normal except for trace proteins and ketones Willl try bactrim for possible UTI sxs and also unexplained white count PRostate exam was totally normal, he did not have any BPH or prostatitis/epididymitis on testicular or rectal exam.  He will get stronger pain meds for posisble back sprain/strain in etiology vs renal stones vs abacteria  UTI in etiology. I will not culture it since the UA was negative  Push fluids at home He will discuss with his PCP his increase frequency urinary sxs if the bactrim does not help resolve it.  F/u prn    Gross sideeffects, risk and benefits, and alternatives of medications d/w  patient. Patient is aware that all medications have potential sideeffects and we are unable to predict every sideeffect or drug-drug interaction that may occur.  Samira Acero, [place], DO 12/03/2013 12:59 PM

## 2013-12-11 ENCOUNTER — Telehealth: Payer: Self-pay | Admitting: Family Medicine

## 2013-12-11 MED ORDER — CYCLOBENZAPRINE HCL 5 MG PO TABS: 5.0000 mg | ORAL_TABLET | Freq: Every evening | ORAL | Status: DC | PRN

## 2013-12-11 NOTE — Telephone Encounter (Signed)
Spoke to patient he is feeling better. His urianry sxs are resolved, still has some back pain which is uncofrotable for him when he goes to sleep. He stopepd taking percocet sicne did not agree with stomach. His labs are normal. Will try flexeril at night only.

## 2014-06-03 ENCOUNTER — Ambulatory Visit (INDEPENDENT_AMBULATORY_CARE_PROVIDER_SITE_OTHER): Payer: 59 | Admitting: Family Medicine

## 2014-06-03 VITALS — BP 130/80 | HR 80 | Temp 98.2°F | Resp 16 | Ht 68.5 in | Wt 258.0 lb

## 2014-06-03 DIAGNOSIS — M545 Low back pain, unspecified: Secondary | ICD-10-CM

## 2014-06-03 DIAGNOSIS — R300 Dysuria: Secondary | ICD-10-CM

## 2014-06-03 DIAGNOSIS — D72829 Elevated white blood cell count, unspecified: Secondary | ICD-10-CM

## 2014-06-03 DIAGNOSIS — R350 Frequency of micturition: Secondary | ICD-10-CM

## 2014-06-03 LAB — POCT CBC
Granulocyte percent: 69.3 %G (ref 37–80)
HCT, POC: 45.5 % (ref 43.5–53.7)
Hemoglobin: 14.9 g/dL (ref 14.1–18.1)
Lymph, poc: 3.1 (ref 0.6–3.4)
MCH, POC: 30.1 pg (ref 27–31.2)
MCHC: 32.9 g/dL (ref 31.8–35.4)
MCV: 91.7 fL (ref 80–97)
MID (cbc): 0.7 (ref 0–0.9)
MPV: 8.3 fL (ref 0–99.8)
POC Granulocyte: 8.5 — AB (ref 2–6.9)
POC LYMPH PERCENT: 25.1 %L (ref 10–50)
POC MID %: 5.6 % (ref 0–12)
Platelet Count, POC: 225 10*3/uL (ref 142–424)
RBC: 4.96 M/uL (ref 4.69–6.13)
RDW, POC: 14.9 %
WBC: 12.3 10*3/uL — AB (ref 4.6–10.2)

## 2014-06-03 LAB — POCT UA - MICROSCOPIC ONLY
Casts, Ur, LPF, POC: NEGATIVE
Crystals, Ur, HPF, POC: NEGATIVE
Mucus, UA: NEGATIVE
Yeast, UA: NEGATIVE

## 2014-06-03 LAB — POCT URINALYSIS DIPSTICK
Bilirubin, UA: NEGATIVE
Blood, UA: NEGATIVE
Glucose, UA: NEGATIVE
Ketones, UA: NEGATIVE
Leukocytes, UA: NEGATIVE
Nitrite, UA: NEGATIVE
Protein, UA: NEGATIVE
Spec Grav, UA: 1.02
Urobilinogen, UA: 1
pH, UA: 7.5

## 2014-06-03 MED ORDER — OXYCODONE-ACETAMINOPHEN 7.5-325 MG PO TABS
1.0000 | ORAL_TABLET | Freq: Three times a day (TID) | ORAL | Status: DC | PRN
Start: 2014-06-03 — End: 2014-06-09

## 2014-06-03 MED ORDER — CEFTRIAXONE SODIUM 1 G IJ SOLR
1.0000 g | Freq: Once | INTRAMUSCULAR | Status: AC
  Administered 2014-06-03: 1 g via INTRAMUSCULAR

## 2014-06-03 NOTE — Progress Notes (Signed)
Chief Complaint:  Chief Complaint  Patient presents with  . Low back pain    Onset 3days ago  . Polyuria  . Dysuria    HPI: Ryan Hickman is a 54 y.o. male who is here for  Hx of LBP, he has backpain described as a toothache midline, dysuria, increase frequency Since Sunday. No fevers or chills., nausea , vomiting. He has been eating and drinking well. No abd pain.  He denies diabets, he had a PE in September that was WNL.  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  He had simialr symptoms the last time and did haveleukocytosis and was treated wi abx and helped.  HE has had a cystoscopy and never found anything, this was several years ago, history of kidney stones.Does nto feel like pain ahs moved. Feels tlike it stays three.   Wt Readings from Last 3 Encounters:  06/03/14 258 lb (117.028 kg)  12/03/13 249 lb 3.2 oz (113.036 kg)  11/14/13 248 lb (112.492 kg)     Past Medical History  Diagnosis Date  . Hypertension   . Depression   . Acid reflux   . Hyperlipidemia   . Insomnia   . Childhood asthma   . History of ETOH abuse    Past Surgical History  Procedure Laterality Date  . Knee surgery  1992  . Colonoscopy  May 2010    Dr. Oneida Alar: simple adenoma, normal colon. Repeat 2020  . Upper gastrointestinal endoscopy  May 2010    Dr. Oneida Alar: normal  . Ear cyst excision N/A 11/14/2013    Procedure: EXCISION SEBACEOUS CYST CHEST WALL;  Surgeon: Jamesetta So, MD;  Location: [facility];  Service: General;  Laterality: N/A;   History   Social History  . Marital Status: Married    Spouse Name: N/A  . Number of Children: N/A  . Years of Education: N/A   Social History Main Topics  . Smoking status: Former Smoker -- 3.00 packs/day for 25 years    Types: Cigarettes    Quit date: 01/19/2003  . Smokeless tobacco: Never Used  . Alcohol Use: No     Comment: former  . Drug Use: No  . Sexual Activity: Not  on file   Other Topics Concern  . None   Social History Narrative   Family History  Problem Relation Age of Onset  . Colon cancer Neg Hx   . Heart failure Other    Allergies  Allergen Reactions  . Daypro [Oxaprozin] Anaphylaxis  . Hysingla Er [Hydrocodone Bitartrate Er] Hives  . Levofloxacin Hives   Prior to Admission medications   Medication Sig Start Date End Date Taking? Authorizing Provider  amLODipine (NORVASC) 5 MG tablet Take 5 mg by mouth daily.   Yes Historical Provider, MD  aspirin EC 81 MG tablet Take 81 mg by mouth every morning.   Yes Historical Provider, MD  atorvastatin (LIPITOR) 10 MG tablet Take 10 mg by mouth daily.   Yes Historical Provider, MD  cyclobenzaprine (FLEXERIL) 5 MG tablet Take 1 tablet (5 mg total) by mouth at bedtime as needed for muscle spasms. 12/11/13  Yes Levonia Wolfley P Salif Tay, DO  diclofenac (VOLTAREN) 50 MG EC tablet Take 1 tablet (50 mg total) by mouth 2 (two) times daily. 07/22/13  Yes Carole Civil, MD  enalapril (VASOTEC) 20 MG tablet Take 20 mg by mouth 2 (two)  times daily.     Yes Historical Provider, MD  FLUoxetine (PROZAC) 40 MG capsule Take 40 mg by mouth daily.   Yes Historical Provider, MD  LORazepam (ATIVAN) 2 MG tablet Take 2-3 mg by mouth at bedtime as needed (sleep).    Yes Historical Provider, MD  Multiple Vitamins-Minerals (ONE-A-DAY MENS 50+ ADVANTAGE PO) Take 1 tablet by mouth daily.   Yes Historical Provider, MD  omeprazole (PRILOSEC) 20 MG capsule Take 20 mg by mouth daily.   Yes Historical Provider, MD  verapamil (VERELAN PM) 360 MG 24 hr capsule Take 360 mg by mouth at bedtime.   Yes Historical Provider, MD     ROS: The patient denies fevers, chills, night sweats, unintentional weight loss, chest pain, palpitations, wheezing, dyspnea on exertion, nausea, vomiting, abdominal pain, dysuria, hematuria, melena, numbness, weakness, or tingling.  All other systems have been reviewed and were otherwise negative with the exception of  those mentioned in the HPI and as above.    PHYSICAL EXAM: Filed Vitals:   06/03/14 1635  BP: 130/80  Pulse: 80  Temp: 98.2 F (36.8 C)  Resp: 16   Filed Vitals:   06/03/14 1635  Height: 5' 8.5" (1.74 m)  Weight: 258 lb (117.028 kg)   Body mass index is 38.65 kg/(m^2).  General: Alert, no acute distress HEENT:  Normocephalic, atraumatic, oropharynx patent. EOMI, PERRLA Cardiovascular:  Regular rate and rhythm, no rubs murmurs or gallops.  No Carotid bruits, radial pulse intact. No pedal edema.  Respiratory: Clear to auscultation bilaterally.  No wheezes, rales, or rhonchi.  No cyanosis, no use of accessory musculature GI: No organomegaly, abdomen is soft and non-tender, positive bowel sounds.  No masses. Skin: No rashes. Neurologic: Facial musculature symmetric. Psychiatric: Patient is appropriate throughout our interaction. Lymphatic: No cervical lymphadenopathy Musculoskeletal: Gait intact. + paramsk tenderness  Minimal Decrease ROM dt pain, +CVA tenderness bialterally 5/5 strength, 2/2 DTRs No saddle anesthesia Straight leg negative Hip and knee exam--normal    LABS: Results for orders placed or performed in visit on 06/03/14  POCT CBC  Result Value Ref Range   WBC 12.3 (A) 4.6 - 10.2 K/uL   Lymph, poc 3.1 0.6 - 3.4   POC LYMPH PERCENT 25.1 10 - 50 %L   MID (cbc) 0.7 0 - 0.9   POC MID % 5.6 0 - 12 %M   POC Granulocyte 8.5 (A) 2 - 6.9   Granulocyte percent 69.3 37 - 80 %G   RBC 4.96 4.69 - 6.13 M/uL   Hemoglobin 14.9 14.1 - 18.1 g/dL   HCT, POC 45.5 43.5 - 53.7 %   MCV 91.7 80 - 97 fL   MCH, POC 30.1 27 - 31.2 pg   MCHC 32.9 31.8 - 35.4 g/dL   RDW, POC 14.9 %   Platelet Count, POC 225 142 - 424 K/uL   MPV 8.3 0 - 99.8 fL  POCT UA - Microscopic Only  Result Value Ref Range   WBC, Ur, HPF, POC 0-1    RBC, urine, microscopic 0-1    Bacteria, U Microscopic trace    Mucus, UA neg    Epithelial cells, urine per micros 0-2    Crystals, Ur, HPF, POC neg     Casts, Ur, LPF, POC neg    Yeast, UA neg   POCT urinalysis dipstick  Result Value Ref Range   Color, UA yellow    Clarity, UA clear    Glucose, UA neg    Bilirubin, UA neg  Ketones, UA neg    Spec Grav, UA 1.020    Blood, UA neg    pH, UA 7.5    Protein, UA neg    Urobilinogen, UA 1.0    Nitrite, UA neg    Leukocytes, UA Negative      EKG/XRAY:   Primary read interpreted by Dr. Marin Comment at [facility] [address].   ASSESSMENT/PLAN: Encounter Diagnoses  Name Primary?  . Dysuria Yes  . Midline low back pain without sciatica   . Increased urinary frequency   . Leukocytosis    Will presumptively treat with Rocephin He may have passed a stone,but I am not sure sinc ehe does h/o renal stone? He also has a bulging disc which may contribute to slight inflammation/leukocytosis due to pain Denies STDs, he is married and with 2 grown daughters, declines additionaltesting Will give rocephin for leukocytosis coverage and also increase urinary frequency Push fluids Rx pain meds F/u in 2 days with me  Gross sideeffects, risk and benefits, and alternatives of medications d/w patient. Patient is aware that all medications have potential sideeffects and we are unable to predict every sideeffect or drug-drug interaction that may occur.  Arzell Mcgeehan, [place], DO 06/03/2014 6:28 PM

## 2014-06-04 LAB — COMPLETE METABOLIC PANEL WITHOUT GFR
ALT: 49 U/L (ref 0–53)
Albumin: 4.8 g/dL (ref 3.5–5.2)
CO2: 27 meq/L (ref 19–32)
Calcium: 9.7 mg/dL (ref 8.4–10.5)
Chloride: 102 meq/L (ref 96–112)
GFR, Est African American: >89 mL/min
GFR, Est Non African American: >89 mL/min
Glucose, Bld: 80 mg/dL (ref 70–99)
Potassium: 4.5 meq/L (ref 3.5–5.3)
Sodium: 140 meq/L (ref 135–145)
Total Protein: 7.3 g/dL (ref 6.0–8.3)

## 2014-06-04 LAB — COMPLETE METABOLIC PANEL WITH GFR
AST: 29 U/L (ref 0–37)
Alkaline Phosphatase: 97 U/L (ref 39–117)
BUN: 11 mg/dL (ref 6–23)
Creat: 0.86 mg/dL (ref 0.50–1.35)
Total Bilirubin: 0.5 mg/dL (ref 0.2–1.2)

## 2014-06-08 ENCOUNTER — Encounter: Payer: Self-pay | Admitting: Orthopedic Surgery

## 2014-06-09 ENCOUNTER — Encounter: Payer: Self-pay | Admitting: Family Medicine

## 2014-06-09 ENCOUNTER — Other Ambulatory Visit: Payer: Self-pay | Admitting: Family Medicine

## 2014-06-10 MED ORDER — OXYCODONE-ACETAMINOPHEN 7.5-325 MG PO TABS
1.0000 | ORAL_TABLET | Freq: Three times a day (TID) | ORAL | Status: DC | PRN
Start: 2014-06-10 — End: 2016-07-18

## 2014-06-10 NOTE — Addendum Note (Signed)
Addended by: Elwyn Reach A on: 06/10/2014 04:41 PM   Modules accepted: Orders

## 2014-06-10 NOTE — Telephone Encounter (Signed)
Have advised patient you are back on Monday

## 2014-06-10 NOTE — Telephone Encounter (Signed)
Can you ask one of the PAs to refill his percocet for me, I am out of town.

## 2014-06-12 NOTE — Telephone Encounter (Signed)
Pt has already p/up

## 2014-07-04 ENCOUNTER — Encounter: Payer: Self-pay | Admitting: Family Medicine

## 2014-10-22 ENCOUNTER — Other Ambulatory Visit (HOSPITAL_COMMUNITY): Payer: Self-pay | Admitting: Family Medicine

## 2014-10-22 DIAGNOSIS — R748 Abnormal levels of other serum enzymes: Secondary | ICD-10-CM

## 2014-10-27 ENCOUNTER — Ambulatory Visit (HOSPITAL_COMMUNITY)
Admission: RE | Admit: 2014-10-27 | Discharge: 2014-10-27 | Disposition: A | Discharge status: Home / Self Care | Payer: 59 | Source: Ambulatory Visit | Attending: Family Medicine | Admitting: Family Medicine

## 2014-10-27 DIAGNOSIS — R161 Splenomegaly, not elsewhere classified: Secondary | ICD-10-CM | POA: Diagnosis not present

## 2014-10-27 DIAGNOSIS — R932 Abnormal findings on diagnostic imaging of liver and biliary tract: Secondary | ICD-10-CM | POA: Insufficient documentation

## 2014-10-27 DIAGNOSIS — R748 Abnormal levels of other serum enzymes: Secondary | ICD-10-CM

## 2014-10-27 DIAGNOSIS — R945 Abnormal results of liver function studies: Secondary | ICD-10-CM | POA: Diagnosis not present

## 2015-03-30 ENCOUNTER — Ambulatory Visit (INDEPENDENT_AMBULATORY_CARE_PROVIDER_SITE_OTHER): Payer: 59 | Admitting: Allergy and Immunology

## 2015-03-30 ENCOUNTER — Encounter: Payer: Self-pay | Admitting: Allergy and Immunology

## 2015-03-30 VITALS — BP 128/76 | HR 86 | Temp 99.0°F | Resp 18 | Ht 68.5 in | Wt 263.0 lb

## 2015-03-30 DIAGNOSIS — L509 Urticaria, unspecified: Secondary | ICD-10-CM

## 2015-03-30 DIAGNOSIS — L299 Pruritus, unspecified: Secondary | ICD-10-CM | POA: Diagnosis not present

## 2015-03-30 DIAGNOSIS — J310 Chronic rhinitis: Secondary | ICD-10-CM

## 2015-03-30 DIAGNOSIS — R05 Cough: Secondary | ICD-10-CM | POA: Diagnosis not present

## 2015-03-30 DIAGNOSIS — R059 Cough, unspecified: Secondary | ICD-10-CM

## 2015-03-30 MED ORDER — HYDROXYZINE HCL 10 MG PO TABS: 10.0000 mg | ORAL_TABLET | Freq: Three times a day (TID) | ORAL | Status: DC | PRN

## 2015-03-30 NOTE — Progress Notes (Signed)
NEW PATIENT NOTE  RE: Ryan Hickman MRN: [id number] DOB: 04-07-1961 [facility] [place] [address] [place], [place]  [address] Date of Office Visit: 03/30/2015  Referring provider: Jake Samples, PA-C [address] [place], [place] [address]  Subjective:  Ryan Hickman is a 54 y.o. male who presents today for Allergy Testing and Urticaria  Assessment:   1. Hives,   2. Itching.   3. Chronic rhinitis, suspected allergic etiology.    4.      Intermittent cough, report of childhood asthma and childhood positive skin test. Plan:   Patient Instructions  1. Avoidance: fragranced soaps, lotions, detergents and meds as previously discussed. 2. Antihistamine: Claritin 10mg   by mouth once daily for runny nose or itching.     Hydroxyzine10mg  as needed for itching. 3. Nasal Spray:  Flonase 1-2 spray(s) each nostril oncedaily for stuffy nose or drainage.  4.  Consider selected labs at [facility]. 5.  If new skin episodes take pictures, document environment, exposure, ingestion and activity. 6.  Moisturize skin 2-4 times daily with Vanicream, Aveeno or Cetaphil. 7. Nasal Saline wash each evening at bathtime. 8. Follow up Visit:  For skin testing off antihistamines (Claritin/benadryl/hydroxyzine etc...) 72 hours prior to appointment.  HPI: Ryan Hickman presents to the office with a recurring history of rhinorrhea, congestion, sneezing, itchy watery eyes, post nasal drip and occasional cough.   No wheeze, shortness of breath or difficulty in breathing, disrupted sleep or activity.  Generally describes pollen, dust, cat, outdoors and fluctuant weather patterns as provoking factors of symptoms.  He remembers feathers triggering symptoms and positive skin testing, both as a child.  He denies any history of sensitive skin or eczema, but has been concerned about skin difficulties in the last 6 months.  Initially difficulty began with itching at his  palms and hands with redness and the sensation of the hand/facial swelling and tightness of his neck.   Symptoms may have been greater in the hot summer, and initially thought to be related to hydrocodeine treatment for knee pain.  Hives seem to continue off codeine and therefore began with Benadryl on a few occasions.   Because of persisting symptoms, he saw primary care physician and prednisone course was initiated. He was able to decrease Benadryl but continued with intermittent hive difficulty, therefore referral here. The addition of Claritin,  Atarax  after prednisone seems of partial benefit.    No bruising or chronic skin changes. Denies associated GI symptoms, change with meals, activity, work or other maintenance medications. He denies associated shortness of breath, difficulty breathing or change overnight. He may have had tick bites years ago, nothing recently.   Denies emergency department or urgent care visits,  antibiotic courses, hospitalizations, exercise or nocturnal induced difficulty.  Medical History: Past Medical History  Diagnosis Date  . Hypertension   . Depression   . Acid reflux   . Hyperlipidemia   . Insomnia   . Childhood asthma   . History of ETOH abuse   . Urticaria    Surgical History: Past Surgical History  Procedure Laterality Date  . Knee surgery  1992  . Colonoscopy  May 2010    Dr. Oneida Alar: simple adenoma, normal colon. Repeat 2020  . Upper gastrointestinal endoscopy  May 2010    Dr. Oneida Alar: normal  . Ear cyst excision N/A 11/14/2013    Procedure: EXCISION SEBACEOUS CYST CHEST WALL;  Surgeon: Jamesetta So, MD;  Location: [facility];  Service: General;  Laterality: N/A;   Family History: Family History  Problem Relation Age of Onset  . Colon cancer Neg Hx   . Allergic rhinitis Neg Hx   . Asthma Neg Hx   . Eczema Neg Hx   . Urticaria Neg Hx   . Heart failure Other    Social History: Social History  . Marital Status: Married    Spouse Name: N/A  .  Number of Children: N/A  . Years of Education: N/A   Social History Main Topics  . Smoking status: Former Smoker -- 3.00 packs/day for 25 years    Types: Cigarettes    Quit date: 01/19/2003  . Smokeless tobacco: Never Used  . Alcohol Use: No     Comment: former  . Drug Use: No  . Sexual Activity: Not on file   Social History Narrative  Ryan Hickman is an [employer], who smokes cigars once a week.  Medications prior to this encounter: Outpatient Prescriptions Prior to Visit  Medication Sig Dispense Refill  . amLODipine (NORVASC) 5 MG tablet Take 5 mg by mouth daily.    Marland Kitchen aspirin EC 81 MG tablet Take 81 mg by mouth every morning.    Marland Kitchen atorvastatin (LIPITOR) 10 MG tablet Take 10 mg by mouth daily.    . enalapril (VASOTEC) 20 MG tablet Take 20 mg by mouth 2 (two) times daily.      Marland Kitchen FLUoxetine (PROZAC) 40 MG capsule Take 40 mg by mouth daily.    Marland Kitchen LORazepam (ATIVAN) 2 MG tablet Take 2-3 mg by mouth at bedtime as needed (sleep).     . Multiple Vitamins-Minerals (ONE-A-DAY MENS 50+ ADVANTAGE PO) Take 1 tablet by mouth daily.    Marland Kitchen omeprazole (PRILOSEC) 20 MG capsule Take 20 mg by mouth daily.    . verapamil (VERELAN PM) 360 MG 24 hr capsule Take 360 mg by mouth at bedtime.    . cyclobenzaprine (FLEXERIL) 5 MG tablet Take 1 tablet (5 mg total) by mouth at bedtime as needed for muscle spasms. (Patient not taking: Reported on 03/30/2015) 30 tablet 0  . diclofenac (VOLTAREN) 50 MG EC tablet Take 1 tablet (50 mg total) by mouth 2 (two) times daily. (Patient not taking: Reported on 03/30/2015) 60 tablet 1  . oxyCODONE-acetaminophen (PERCOCET) 7.5-325 MG per tablet Take 1-2 tablets by mouth every 8 (eight) hours as needed. Please take with stool softener (Patient not taking: Reported on 03/30/2015) 30 tablet 0   No facility-administered medications prior to visit.   Drug Allergies: Allergies  Allergen Reactions  . Daypro [Oxaprozin] Anaphylaxis  . Hysingla Er [Hydrocodone Bitartrate Er] Hives  .  Levofloxacin Hives   Environmental History: Ryan Hickman lives in a 53 year old house since built with laminate floors, central air and heat without humidifier, or smokers.  Dogs throughout the home.  Stuffed mattress non-feather pillow and comforter.  Review of Systems  Constitutional: Negative for fever, weight loss and malaise/fatigue.  HENT: Positive for congestion. Negative for ear pain, hearing loss, nosebleeds and sore throat.   Eyes: Negative for discharge and redness.       Corrective eyeglasses lenses for 10 years.  Respiratory: Negative for shortness of breath.        Denies history of bronchitis and pneumonia.  Negative PPD.  Gastrointestinal: Positive for heartburn. Negative for nausea, vomiting, abdominal pain, diarrhea and constipation.  Genitourinary: Negative.   Musculoskeletal: Positive for joint pain. Negative for myalgias.  Skin:       See history  of present illness.  Neurological: Negative.  Negative for dizziness, seizures, weakness and headaches.  Endo/Heme/Allergies: Positive for environmental allergies.       Denies sensitivity to aspirin, NSAIDs, stinging insects, foods, latex, jewelry.   Objective:   Filed Vitals:   03/30/15 0916  BP: 128/76  Pulse: 86  Temp: 99 F (37.2 C)  Resp: 18   Physical Exam  Constitutional: He is well-developed, well-nourished, and in no distress.  HENT:  Head: Atraumatic.  Right Ear: Tympanic membrane and ear canal normal.  Left Ear: Tympanic membrane and ear canal normal.  Nose: Mucosal edema present. No rhinorrhea. No epistaxis.  Mouth/Throat: Oropharynx is clear and moist and mucous membranes are normal. No oropharyngeal exudate, posterior oropharyngeal edema or posterior oropharyngeal erythema.  Eyes: Conjunctivae are normal.  Neck: Neck supple.  Cardiovascular: Normal rate, S1 normal and S2 normal.   No murmur heard. Pulmonary/Chest: Effort normal and breath sounds normal. He has no wheezes. He has no rhonchi. He has no  rales.  Abdominal: Soft. Bowel sounds are normal.  Lymphadenopathy:    He has no cervical adenopathy.  Neurological: He is alert.  Skin: Skin is warm and intact. No rash noted. No cyanosis. Nails show no clubbing.  Generalized dryness and well healed scratch areas.  No dermatographia.   Diagnostics: Spirometry:  FVC  3.75--81%, FEV1 2.78--82%.  Skin testing deferred today.    Roselyn M. Ishmael Holter, MD  cc:  Delman Cheadle, PA-C

## 2015-03-30 NOTE — Patient Instructions (Signed)
Take Home Sheet  1. Avoidance: fragranced soaps, lotions, detergents and meds as previously discussed.   2. Antihistamine: Claritin 10mg   by mouth once daily for runny nose or itching.     Hydroxyzine10mg  as needed for itching.  3. Nasal Spray:  Flonase 1-2 spray(s) each nostril oncedaily for stuffy nose or drainage.   4.  Consider selected labs at [facility].  5.  If new skin episodes take pictures, document environment, exposure, ingestion and activity.  6.  Moisturize skin 2-4 times daily with Vanicream, Aveeno or Cetaphil.  7. Nasal Saline wash each evening at bathtime.   8. Follow up Visit:  For skin testing off antihistamines (Claritin/benadryl/hydroxyzine etc...) 72 hours prior to appointment.   Websites that have reliable Patient information: 1. American Academy of Asthma, Allergy, & Immunology: www.aaaai.org 2. Food Allergy Network: www.foodallergy.org 3. Mothers of Asthmatics: www.aanma.org 4. [place]: DiningCalendar.de 5. American College of Allergy, Asthma, & Immunology: https://robertson.info/ or www.acaai.org

## 2015-04-06 ENCOUNTER — Encounter: Payer: Self-pay | Admitting: Allergy and Immunology

## 2015-04-20 ENCOUNTER — Ambulatory Visit: Payer: 59 | Admitting: Allergy and Immunology

## 2015-11-09 ENCOUNTER — Emergency Department (HOSPITAL_COMMUNITY)
Admission: EM | Admit: 2015-11-09 | Discharge: 2015-11-09 | Disposition: A | Discharge status: Home / Self Care | Payer: 59 | Attending: Emergency Medicine | Admitting: Emergency Medicine

## 2015-11-09 ENCOUNTER — Emergency Department (HOSPITAL_COMMUNITY): Payer: 59

## 2015-11-09 ENCOUNTER — Encounter (HOSPITAL_COMMUNITY): Payer: Self-pay | Admitting: Emergency Medicine

## 2015-11-09 DIAGNOSIS — Z7982 Long term (current) use of aspirin: Secondary | ICD-10-CM | POA: Insufficient documentation

## 2015-11-09 DIAGNOSIS — E785 Hyperlipidemia, unspecified: Secondary | ICD-10-CM | POA: Diagnosis not present

## 2015-11-09 DIAGNOSIS — F329 Major depressive disorder, single episode, unspecified: Secondary | ICD-10-CM | POA: Insufficient documentation

## 2015-11-09 DIAGNOSIS — I10 Essential (primary) hypertension: Secondary | ICD-10-CM | POA: Diagnosis not present

## 2015-11-09 DIAGNOSIS — W57XXXA Bitten or stung by nonvenomous insect and other nonvenomous arthropods, initial encounter: Secondary | ICD-10-CM

## 2015-11-09 DIAGNOSIS — R509 Fever, unspecified: Secondary | ICD-10-CM | POA: Diagnosis not present

## 2015-11-09 DIAGNOSIS — Z79899 Other long term (current) drug therapy: Secondary | ICD-10-CM | POA: Diagnosis not present

## 2015-11-09 DIAGNOSIS — Z87891 Personal history of nicotine dependence: Secondary | ICD-10-CM | POA: Insufficient documentation

## 2015-11-09 LAB — COMPREHENSIVE METABOLIC PANEL
ALBUMIN: 4.2 g/dL (ref 3.5–5.0)
ALK PHOS: 71 U/L (ref 38–126)
ALT: 30 U/L (ref 17–63)
ANION GAP: 9 (ref 5–15)
AST: 29 U/L (ref 15–41)
BILIRUBIN TOTAL: 1.3 mg/dL — AB (ref 0.3–1.2)
BUN: 16 mg/dL (ref 6–20)
CALCIUM: 8.8 mg/dL — AB (ref 8.9–10.3)
CO2: 26 mmol/L (ref 22–32)
CREATININE: 0.79 mg/dL (ref 0.61–1.24)
Chloride: 100 mmol/L — ABNORMAL LOW (ref 101–111)
GFR calc Af Amer: >60 mL/min (ref >60)
GFR calc non Af Amer: >60 mL/min (ref >60)
GLUCOSE: 90 mg/dL (ref 65–99)
Potassium: 4.1 mmol/L (ref 3.5–5.1)
SODIUM: 135 mmol/L (ref 135–145)
TOTAL PROTEIN: 7.4 g/dL (ref 6.5–8.1)

## 2015-11-09 LAB — CBC WITH DIFFERENTIAL/PLATELET
BASOS PCT: 0 %
Basophils Absolute: 0 10*3/uL (ref 0.0–0.1)
EOS ABS: 0.4 10*3/uL (ref 0.0–0.7)
Eosinophils Relative: 4 %
HEMATOCRIT: 44.1 % (ref 39.0–52.0)
HEMOGLOBIN: 14.9 g/dL (ref 13.0–17.0)
LYMPHS ABS: 1.3 10*3/uL (ref 0.7–4.0)
Lymphocytes Relative: 13 %
MCH: 30.3 pg (ref 26.0–34.0)
MCHC: 33.8 g/dL (ref 30.0–36.0)
MCV: 89.8 fL (ref 78.0–100.0)
MONOS PCT: 6 %
Monocytes Absolute: 0.6 10*3/uL (ref 0.1–1.0)
NEUTROS ABS: 7.4 10*3/uL (ref 1.7–7.7)
NEUTROS PCT: 77 %
Platelets: 152 10*3/uL (ref 150–400)
RBC: 4.91 MIL/uL (ref 4.22–5.81)
RDW: 13.1 % (ref 11.5–15.5)
WBC: 9.7 10*3/uL (ref 4.0–10.5)

## 2015-11-09 LAB — URINALYSIS, ROUTINE W REFLEX MICROSCOPIC
GLUCOSE, UA: NEGATIVE mg/dL
HGB URINE DIPSTICK: NEGATIVE
LEUKOCYTES UA: NEGATIVE
Nitrite: NEGATIVE
PH: 5.5 (ref 5.0–8.0)
PROTEIN: NEGATIVE mg/dL
Specific Gravity, Urine: >1.03 — ABNORMAL HIGH (ref 1.005–1.030)

## 2015-11-09 MED ORDER — DOXYCYCLINE HYCLATE 100 MG PO CAPS: 100.0000 mg | ORAL_CAPSULE | Freq: Two times a day (BID) | ORAL | Status: DC

## 2015-11-09 NOTE — ED Notes (Signed)
PT states he has had several tick bites in the past month and for the past 3-4 nights he has had night sweats, fever, generalized weakness and body aches.

## 2015-11-09 NOTE — ED Provider Notes (Signed)
CSN: [id number]     Arrival date & time 11/09/15  1321 History   First MD Initiated Contact with Patient 11/09/15 1439     Chief Complaint  Patient presents with  . Fever     (Consider location/radiation/quality/duration/timing/severity/associated sxs/prior Treatment) Patient is a 55 y.o. male presenting with fever. The history is provided by the patient (Patient states he's had numerous tick bites in the last couple weeks. And he has had fevers and chills for for 5 days).  Fever Temp source:  Subjective Severity:  Moderate Onset quality:  Gradual Timing:  Intermittent Progression:  Waxing and waning Chronicity:  New Relieved by:  Nothing Associated symptoms: no chest pain, no congestion, no cough, no diarrhea, no headaches and no rash     Past Medical History  Diagnosis Date  . Hypertension   . Depression   . Acid reflux   . Hyperlipidemia   . Insomnia   . Childhood asthma   . History of ETOH abuse   . Urticaria    Past Surgical History  Procedure Laterality Date  . Knee surgery  1992  . Colonoscopy  May 2010    Dr. Oneida Alar: simple adenoma, normal colon. Repeat 2020  . Upper gastrointestinal endoscopy  May 2010    Dr. Oneida Alar: normal  . Ear cyst excision N/A 11/14/2013    Procedure: EXCISION SEBACEOUS CYST CHEST WALL;  Surgeon: Jamesetta So, MD;  Location: [facility];  Service: General;  Laterality: N/A;   Family History  Problem Relation Age of Onset  . Colon cancer Neg Hx   . Allergic rhinitis Neg Hx   . Asthma Neg Hx   . Eczema Neg Hx   . Urticaria Neg Hx   . Heart failure Other    Social History  Substance Use Topics  . Smoking status: Former Smoker -- 3.00 packs/day for 25 years    Types: Cigarettes    Quit date: 01/19/2003  . Smokeless tobacco: Never Used  . Alcohol Use: No     Comment: former    Review of Systems  Constitutional: Positive for fever. Negative for appetite change and fatigue.  HENT: Negative for congestion, ear discharge and sinus  pressure.   Eyes: Negative for discharge.  Respiratory: Negative for cough.   Cardiovascular: Negative for chest pain.  Gastrointestinal: Negative for abdominal pain and diarrhea.  Genitourinary: Negative for frequency and hematuria.  Musculoskeletal: Negative for back pain.  Skin: Negative for rash.  Neurological: Negative for seizures and headaches.  Psychiatric/Behavioral: Negative for hallucinations.      Allergies  Daypro; Hysingla er; and Levofloxacin  Home Medications   Prior to Admission medications   Medication Sig Start Date End Date Taking? Authorizing Provider  amLODipine (NORVASC) 5 MG tablet Take 5 mg by mouth daily.   Yes Historical Provider, MD  aspirin EC 81 MG tablet Take 81 mg by mouth every morning.   Yes Historical Provider, MD  atorvastatin (LIPITOR) 80 MG tablet Take 1 tablet by mouth daily. 11/04/15  Yes Historical Provider, MD  enalapril (VASOTEC) 20 MG tablet Take 20 mg by mouth 2 (two) times daily.     Yes Historical Provider, MD  FLUoxetine (PROZAC) 40 MG capsule Take 40 mg by mouth daily.   Yes Historical Provider, MD  HYDROcodone-acetaminophen (NORCO) 7.5-325 MG tablet Take 1 tablet by mouth every 6 (six) hours as needed for moderate pain.   Yes Historical Provider, MD  LORazepam (ATIVAN) 2 MG tablet Take 3 mg by mouth at bedtime  as needed (sleep).    Yes Historical Provider, MD  Multiple Vitamins-Minerals (ONE-A-DAY MENS 50+ ADVANTAGE PO) Take 1 tablet by mouth daily.   Yes Historical Provider, MD  omeprazole (PRILOSEC) 20 MG capsule Take 20 mg by mouth daily.   Yes Historical Provider, MD  verapamil (VERELAN PM) 360 MG 24 hr capsule Take 360 mg by mouth at bedtime.   Yes Historical Provider, MD  cyclobenzaprine (FLEXERIL) 5 MG tablet Take 1 tablet (5 mg total) by mouth at bedtime as needed for muscle spasms. Patient not taking: Reported on 03/30/2015 12/11/13   Thao P Le, DO  diclofenac (VOLTAREN) 50 MG EC tablet Take 1 tablet (50 mg total) by mouth 2  (two) times daily. Patient not taking: Reported on 03/30/2015 07/22/13   Carole Civil, MD  doxycycline (VIBRAMYCIN) 100 MG capsule Take 1 capsule (100 mg total) by mouth 2 (two) times daily. One po bid x 7 days 11/09/15   Milton Ferguson, MD  hydrOXYzine (ATARAX/VISTARIL) 10 MG tablet Take 1 tablet (10 mg total) by mouth 3 (three) times daily as needed. Patient not taking: Reported on 11/09/2015 03/30/15   Gean Quint, MD  oxyCODONE-acetaminophen (PERCOCET) 7.5-325 MG per tablet Take 1-2 tablets by mouth every 8 (eight) hours as needed. Please take with stool softener Patient not taking: Reported on 03/30/2015 06/10/14   Chelle Jeffery, PA-C   BP 154/76 mmHg  Pulse 76  Temp(Src) 99.8 F (37.7 C) (Oral)  Resp 16  Ht 5\' 10"  (1.778 m)  Wt 260 lb (117.935 kg)  BMI 37.31 kg/m2  SpO2 99% Physical Exam  Constitutional: He is oriented to person, place, and time. He appears well-developed.  HENT:  Head: Normocephalic.  Eyes: Conjunctivae and EOM are normal. No scleral icterus.  Neck: Neck supple. No thyromegaly present.  Cardiovascular: Normal rate and regular rhythm.  Exam reveals no gallop and no friction rub.   No murmur heard. Pulmonary/Chest: No stridor. He has no wheezes. He has no rales. He exhibits no tenderness.  Abdominal: He exhibits no distension. There is no tenderness. There is no rebound.  Musculoskeletal: Normal range of motion. He exhibits no edema.  Lymphadenopathy:    He has no cervical adenopathy.  Neurological: He is oriented to person, place, and time. He exhibits normal muscle tone. Coordination normal.  Skin: No rash noted. No erythema.  Psychiatric: He has a normal mood and affect. His behavior is normal.    ED Course  Procedures (including critical care time) Labs Review Labs Reviewed  COMPREHENSIVE METABOLIC PANEL - Abnormal; Notable for the following:    Chloride 100 (*)    Calcium 8.8 (*)    Total Bilirubin 1.3 (*)    All other components within normal  limits  URINALYSIS, ROUTINE W REFLEX MICROSCOPIC (NOT AT [facility]) - Abnormal; Notable for the following:    Specific Gravity, Urine >1.030 (*)    Bilirubin Urine SMALL (*)    Ketones, ur TRACE (*)    All other components within normal limits  CBC WITH DIFFERENTIAL/PLATELET  ROCKY MTN SPOTTED FVR ABS PNL(IGG+IGM)  B. BURGDORFI ANTIBODIES    Imaging Review Dg Chest 2 View  11/09/2015  CLINICAL DATA:  Fever, hives for 2 days EXAM: CHEST  2 VIEW COMPARISON:  10/09/2012 FINDINGS: The heart size and mediastinal contours are within normal limits. Both lungs are clear. The visualized skeletal structures are unremarkable. IMPRESSION: No active cardiopulmonary disease. Electronically Signed   By: Lahoma Crocker M.D.   On: 11/09/2015 15:30  I have personally reviewed and evaluated these images and lab results as part of my medical decision-making.   EKG Interpretation None      MDM   Final diagnoses:  Tick bite    Patient with fevers and chills and history of tick bites. Labs unremarkable. Patient had [facility] spotted fever and Lyme disease titer sent. Patient will be placed on doxycycline and will follow-up with his PCP    Milton Ferguson, MD 11/09/15 1642

## 2015-11-09 NOTE — ED Notes (Signed)
MD at bedside. 

## 2015-11-09 NOTE — Discharge Instructions (Signed)
Drink plenty of fluids and follow-up with your family doctor in one 2 weeks

## 2015-11-10 LAB — B. BURGDORFI ANTIBODIES: B burgdorferi Ab IgG+IgM: <0.91 {ISR} (ref 0.00–0.90)

## 2015-11-11 LAB — ROCKY MTN SPOTTED FVR ABS PNL(IGG+IGM)
RMSF IgG: NEGATIVE
RMSF IgM: 0.34 index (ref 0.00–0.89)

## 2016-02-10 ENCOUNTER — Ambulatory Visit (INDEPENDENT_AMBULATORY_CARE_PROVIDER_SITE_OTHER): Payer: 59 | Admitting: Orthopaedic Surgery

## 2016-02-10 ENCOUNTER — Encounter: Payer: Self-pay | Admitting: Orthopaedic Surgery

## 2016-02-10 VITALS — BP 157/96 | HR 74 | Temp 97.7°F | Ht 69.5 in | Wt 250.0 lb

## 2016-02-10 DIAGNOSIS — M25562 Pain in left knee: Secondary | ICD-10-CM

## 2016-02-10 DIAGNOSIS — G8929 Other chronic pain: Secondary | ICD-10-CM | POA: Diagnosis not present

## 2016-02-10 DIAGNOSIS — M25561 Pain in right knee: Secondary | ICD-10-CM

## 2016-02-10 NOTE — Progress Notes (Signed)
Subjective:  My knees hurt    Patient ID: Ryan Hickman, male    DOB: 12/04/1960, 55 y.o.   MRN: [id number]  HPI He has long history of knee pain bilaterally.  About 25 to 30 years ago he had knee surgery on the right knee.  It has had pain on and off since then.  He has swelling and popping. He has no giving way.  About a year or so ago he had pain in the left knee and had a MRI which showed a meniscus tear.  He has decided not to have any surgery on the knee.  It gives way at times, it does not lock, it has no redness and has swelling and popping.  He cannot take NSAIDs.  He has seen his family doctor about this and it was suggested he try injections into the knee.  He wants to see if this will help.  He still does not want any surgery on the left knee.  He has changed jobs and is going up and down steps a lot every day and that has made things worse.  He has no trauma recently.     Review of Systems  HENT: Negative for congestion.   Respiratory: Negative for cough and shortness of breath.   Cardiovascular: Negative for chest pain and leg swelling.  Endocrine: Negative for cold intolerance.  Musculoskeletal: Positive for arthralgias, gait problem and joint swelling.  Allergic/Immunologic: Negative for environmental allergies.   Past Medical History:  Diagnosis Date  . Acid reflux   . Childhood asthma   . Depression   . History of ETOH abuse   . Hyperlipidemia   . Hypertension   . Insomnia   . Urticaria     Past Surgical History:  Procedure Laterality Date  . COLONOSCOPY  May 2010   Dr. Oneida Alar: simple adenoma, normal colon. Repeat 2020  . EAR CYST EXCISION N/A 11/14/2013   Procedure: EXCISION SEBACEOUS CYST CHEST WALL;  Surgeon: Jamesetta So, MD;  Location: [facility];  Service: General;  Laterality: N/A;  . [place]  . UPPER GASTROINTESTINAL ENDOSCOPY  May 2010   Dr. Oneida Alar: normal    Current Outpatient Prescriptions on File Prior to Visit  Medication Sig  Dispense Refill  . aspirin EC 81 MG tablet Take 81 mg by mouth every morning.    Marland Kitchen atorvastatin (LIPITOR) 80 MG tablet Take 1 tablet by mouth daily.    . enalapril (VASOTEC) 20 MG tablet Take 20 mg by mouth 2 (two) times daily.      Marland Kitchen FLUoxetine (PROZAC) 40 MG capsule Take 40 mg by mouth daily.    Marland Kitchen HYDROcodone-acetaminophen (NORCO) 7.5-325 MG tablet Take 1 tablet by mouth every 6 (six) hours as needed for moderate pain.    Marland Kitchen LORazepam (ATIVAN) 2 MG tablet Take 3 mg by mouth at bedtime as needed (sleep).     Marland Kitchen omeprazole (PRILOSEC) 20 MG capsule Take 20 mg by mouth daily.    . verapamil (VERELAN PM) 360 MG 24 hr capsule Take 360 mg by mouth at bedtime.    Marland Kitchen amLODipine (NORVASC) 5 MG tablet Take 5 mg by mouth daily.    . cyclobenzaprine (FLEXERIL) 5 MG tablet Take 1 tablet (5 mg total) by mouth at bedtime as needed for muscle spasms. (Patient not taking: Reported on 03/30/2015) 30 tablet 0  . diclofenac (VOLTAREN) 50 MG EC tablet Take 1 tablet (50 mg total) by mouth 2 (two) times daily. (  Patient not taking: Reported on 03/30/2015) 60 tablet 1  . doxycycline (VIBRAMYCIN) 100 MG capsule Take 1 capsule (100 mg total) by mouth 2 (two) times daily. One po bid x 7 days 28 capsule 0  . hydrOXYzine (ATARAX/VISTARIL) 10 MG tablet Take 1 tablet (10 mg total) by mouth 3 (three) times daily as needed. (Patient not taking: Reported on 11/09/2015) 30 tablet 3  . Multiple Vitamins-Minerals (ONE-A-DAY MENS 50+ ADVANTAGE PO) Take 1 tablet by mouth daily.    Marland Kitchen oxyCODONE-acetaminophen (PERCOCET) 7.5-325 MG per tablet Take 1-2 tablets by mouth every 8 (eight) hours as needed. Please take with stool softener (Patient not taking: Reported on 03/30/2015) 30 tablet 0   No current facility-administered medications on file prior to visit.     Social History   Social History  . Marital status: Married    Spouse name: N/A  . Number of children: N/A  . Years of education: N/A   Occupational History  . Not on file.    Social History Main Topics  . Smoking status: Former Smoker    Packs/day: 3.00    Years: 25.00    Types: Cigarettes    Quit date: 01/19/2003  . Smokeless tobacco: Never Used  . Alcohol use No     Comment: former  . Drug use: No  . Sexual activity: Not on file   Other Topics Concern  . Not on file   Social History Narrative  . No narrative on file    Family History  Problem Relation Age of Onset  . Colon cancer Neg Hx   . Allergic rhinitis Neg Hx   . Asthma Neg Hx   . Eczema Neg Hx   . Urticaria Neg Hx   . Heart failure Other     BP (!) 157/96   Pulse 74   Temp 97.7 F (36.5 C)   Ht 5' 9.5" (1.765 m)   Wt 250 lb (113.4 kg)   BMI 36.39 kg/m      Objective:   Physical Exam  Constitutional: He is oriented to person, place, and time. He appears well-developed and well-nourished.  HENT:  Head: Normocephalic and atraumatic.  Eyes: Conjunctivae and EOM are normal. Pupils are equal, round, and reactive to light.  Neck: Normal range of motion. Neck supple.  Cardiovascular: Normal rate, regular rhythm and intact distal pulses.   Pulmonary/Chest: Effort normal.  Abdominal: Soft.  Musculoskeletal: He exhibits tenderness (Both knees have slight tenderness, slight effusions.  Left knee positive medial McMurray, both stable, ROM right 0 to 115 and left 0 to 105.  Gait slight left limp.).  Neurological: He is alert and oriented to person, place, and time. He has normal reflexes. No cranial nerve deficit. He exhibits normal muscle tone. Coordination normal.  Skin: Skin is warm and dry.  Psychiatric: He has a normal mood and affect. His behavior is normal. Judgment and thought content normal.          Assessment & Plan:   Encounter Diagnoses  Name Primary?  . Chronic pain of left knee Yes  . Chronic pain of right knee    PROCEDURE NOTE:  The patient requests injections of the left knee , verbal consent was obtained.  The left knee was prepped appropriately after  time out was performed.   Sterile technique was observed and injection of 1 cc of Depo-Medrol 40 mg with several cc's of plain xylocaine. Anesthesia was provided by ethyl chloride and a 20-gauge needle was used to inject  the knee area. The injection was tolerated well.  A band aid dressing was applied.  The patient was advised to apply ice later today and tomorrow to the injection sight as needed.  PROCEDURE NOTE:  The patient requests injections of the right knee , verbal consent was obtained.  The right knee was prepped appropriately after time out was performed.   Sterile technique was observed and injection of 1 cc of Depo-Medrol 40 mg with several cc's of plain xylocaine. Anesthesia was provided by ethyl chloride and a 20-gauge needle was used to inject the knee area. The injection was tolerated well.  A band aid dressing was applied.  The patient was advised to apply ice later today and tomorrow to the injection sight as needed.  Return in one month.  Continue present medicines.  Call if any problem.  Electronically Signed Sanjuana Kava, MD 10/19/20178:53 AM

## 2016-02-15 ENCOUNTER — Ambulatory Visit: Payer: 59 | Admitting: Orthopaedic Surgery

## 2016-03-14 ENCOUNTER — Ambulatory Visit (INDEPENDENT_AMBULATORY_CARE_PROVIDER_SITE_OTHER): Payer: 59 | Admitting: Orthopaedic Surgery

## 2016-03-14 ENCOUNTER — Encounter: Payer: Self-pay | Admitting: Orthopaedic Surgery

## 2016-03-14 VITALS — BP 177/109 | HR 77 | Temp 97.5°F | Ht 70.0 in | Wt 247.0 lb

## 2016-03-14 DIAGNOSIS — M25561 Pain in right knee: Secondary | ICD-10-CM | POA: Diagnosis not present

## 2016-03-14 DIAGNOSIS — M25562 Pain in left knee: Secondary | ICD-10-CM

## 2016-03-14 DIAGNOSIS — G8929 Other chronic pain: Secondary | ICD-10-CM

## 2016-03-14 NOTE — Progress Notes (Signed)
CC: Both of my knees are hurting. I would like an injection in both knees.  The patient has had chronic pain and tenderness of both knees for some time.  Injections help.  There is no locking or giving way of the knee.  There is no new trauma. There is no redness or signs of infections.  The knees have a mild effusion and some crepitus.  There is no redness or signs of recent trauma.  Right knee ROM is 0-110 and left knee ROM is 0-115.  Impression:  Chronic pain of the both knees  Return:  PRN  PROCEDURE NOTE:  The patient requests injections of both knees, verbal consent was obtained.  The left and right knee were individually prepped appropriately after time out was performed.   Sterile technique was observed and injection of 1 cc of Depo-Medrol 40 mg with several cc's of plain xylocaine. Anesthesia was provided by ethyl chloride and a 20-gauge needle was used to inject each knee area. The injections were tolerated well.  A band aid dressing was applied.  The patient was advised to apply ice later today and tomorrow to the injection sight as needed.   Electronically [place], MD 11/21/20178:31 AM

## 2016-05-25 DIAGNOSIS — G4700 Insomnia, unspecified: Secondary | ICD-10-CM | POA: Diagnosis not present

## 2016-06-08 DIAGNOSIS — M170 Bilateral primary osteoarthritis of knee: Secondary | ICD-10-CM | POA: Diagnosis not present

## 2016-06-22 ENCOUNTER — Telehealth: Payer: Self-pay | Admitting: Orthopedic Surgery

## 2016-06-22 ENCOUNTER — Ambulatory Visit (INDEPENDENT_AMBULATORY_CARE_PROVIDER_SITE_OTHER): Payer: 59

## 2016-06-22 ENCOUNTER — Ambulatory Visit (INDEPENDENT_AMBULATORY_CARE_PROVIDER_SITE_OTHER): Payer: 59 | Admitting: Orthopaedic Surgery

## 2016-06-22 ENCOUNTER — Encounter: Payer: Self-pay | Admitting: Orthopaedic Surgery

## 2016-06-22 ENCOUNTER — Telehealth: Payer: Self-pay

## 2016-06-22 VITALS — BP 173/102 | HR 82 | Temp 97.9°F | Ht 70.0 in | Wt 249.0 lb

## 2016-06-22 DIAGNOSIS — G8929 Other chronic pain: Secondary | ICD-10-CM

## 2016-06-22 DIAGNOSIS — I10 Essential (primary) hypertension: Secondary | ICD-10-CM | POA: Diagnosis not present

## 2016-06-22 DIAGNOSIS — M25562 Pain in left knee: Secondary | ICD-10-CM

## 2016-06-22 DIAGNOSIS — Z0001 Encounter for general adult medical examination with abnormal findings: Secondary | ICD-10-CM | POA: Diagnosis not present

## 2016-06-22 NOTE — Telephone Encounter (Signed)
To Dr Aline Brochure to review for sugery

## 2016-06-22 NOTE — Progress Notes (Signed)
Patient Ryan Hickman Ryan Hickman, male DOB:Jan 06, 1961, 55 y.o. [id number]  Chief Complaint  Patient presents with  . Follow-up    bilateral knee pain    HPI  Ryan Hickman is a 56 y.o. male who has bilateral knee pain, more on the left.  He fell off back of a truck several weeks ago.  He has more pain in the knee.  It gives way.  He had MRI in 2015 ordered by Dr. Aline Brochure showing tear of medial meniscus of the knee.  He has put off surgery for various reasons.  Now he says his knee hurts more and he would like to consider surgery.  He saw his family doctor last week and had injection into the left buttocks.  It is better but it still gives way. HPI  Body mass index is 35.73 kg/m.  ROS  Review of Systems  HENT: Negative for congestion.   Respiratory: Negative for cough and shortness of breath.   Cardiovascular: Negative for chest pain and leg swelling.  Endocrine: Negative for cold intolerance.  Musculoskeletal: Positive for arthralgias, gait problem and joint swelling.  Allergic/Immunologic: Negative for environmental allergies.    Past Medical History:  Diagnosis Date  . Acid reflux   . Childhood asthma   . Depression   . History of ETOH abuse   . Hyperlipidemia   . Hypertension   . Insomnia   . Urticaria     Past Surgical History:  Procedure Laterality Date  . COLONOSCOPY  May 2010   Dr. Oneida Alar: simple adenoma, normal colon. Repeat 2020  . EAR CYST EXCISION N/A 11/14/2013   Procedure: EXCISION SEBACEOUS CYST CHEST WALL;  Surgeon: Jamesetta So, MD;  Location: [facility];  Service: General;  Laterality: N/A;  . [place]  . UPPER GASTROINTESTINAL ENDOSCOPY  May 2010   Dr. Oneida Alar: normal    Family History  Problem Relation Age of Onset  . Colon cancer Neg Hx   . Allergic rhinitis Neg Hx   . Asthma Neg Hx   . Eczema Neg Hx   . Urticaria Neg Hx   . Heart failure Other     Social History Social History  Substance Use Topics  . Smoking status: Current  Every Day Smoker    Packs/day: 3.00    Years: 25.00    Types: Cigarettes    Last attempt to quit: 01/19/2003  . Smokeless tobacco: Never Used  . Alcohol use No     Comment: former    Allergies  Allergen Reactions  . Daypro [Oxaprozin] Anaphylaxis  . Hysingla Er [Hydrocodone Bitartrate Er] Hives  . Levofloxacin Hives    Current Outpatient Prescriptions  Medication Sig Dispense Refill  . amLODipine (NORVASC) 5 MG tablet Take 5 mg by mouth daily.    Marland Kitchen aspirin EC 81 MG tablet Take 81 mg by mouth every morning.    Marland Kitchen atorvastatin (LIPITOR) 80 MG tablet Take 1 tablet by mouth daily.    . cyclobenzaprine (FLEXERIL) 5 MG tablet Take 1 tablet (5 mg total) by mouth at bedtime as needed for muscle spasms. (Patient not taking: Reported on 03/30/2015) 30 tablet 0  . diclofenac (VOLTAREN) 50 MG EC tablet Take 1 tablet (50 mg total) by mouth 2 (two) times daily. (Patient not taking: Reported on 03/30/2015) 60 tablet 1  . doxycycline (VIBRAMYCIN) 100 MG capsule Take 1 capsule (100 mg total) by mouth 2 (two) times daily. One po bid x 7 days 28 capsule 0  . enalapril (  VASOTEC) 20 MG tablet Take 20 mg by mouth 2 (two) times daily.      Marland Kitchen FLUoxetine (PROZAC) 40 MG capsule Take 40 mg by mouth daily.    Marland Kitchen HYDROcodone-acetaminophen (NORCO) 7.5-325 MG tablet Take 1 tablet by mouth every 6 (six) hours as needed for moderate pain.    . hydrOXYzine (ATARAX/VISTARIL) 10 MG tablet Take 1 tablet (10 mg total) by mouth 3 (three) times daily as needed. (Patient not taking: Reported on 11/09/2015) 30 tablet 3  . LORazepam (ATIVAN) 2 MG tablet Take 3 mg by mouth at bedtime as needed (sleep).     . Multiple Vitamins-Minerals (ONE-A-DAY MENS 50+ ADVANTAGE PO) Take 1 tablet by mouth daily.    Marland Kitchen omeprazole (PRILOSEC) 20 MG capsule Take 20 mg by mouth daily.    Marland Kitchen oxyCODONE-acetaminophen (PERCOCET) 7.5-325 MG per tablet Take 1-2 tablets by mouth every 8 (eight) hours as needed. Please take with stool softener (Patient not  taking: Reported on 03/30/2015) 30 tablet 0  . verapamil (VERELAN PM) 360 MG 24 hr capsule Take 360 mg by mouth at bedtime.     No current facility-administered medications for this visit.      Physical Exam  Blood pressure (!) 173/102, pulse 82, temperature 97.9 F (36.6 C), height 5\' 10"  (1.778 m), weight 249 lb (112.9 kg).  Constitutional: overall normal hygiene, normal nutrition, well developed, normal grooming, normal body habitus. Assistive device:none  Musculoskeletal: gait and station Limp left, muscle tone and strength are normal, no tremors or atrophy is present.  .  Neurological: coordination overall normal.  Deep tendon reflex/nerve stretch intact.  Sensation normal.  Cranial nerves II-XII intact.   Skin:   Normal overall no scars, lesions, ulcers or rashes. No psoriasis.  Psychiatric: Alert and oriented x 3.  Recent memory intact, remote memory unclear.  Normal mood and affect. Well groomed.  Good eye contact.  Cardiovascular: overall no swelling, no varicosities, no edema bilaterally, normal temperatures of the legs and arms, no clubbing, cyanosis and good capillary refill.  Lymphatic: palpation is normal.  The right lower extremity is examined:  Inspection:  Thigh:  Non-tender and no defects  Knee has swelling 1+ effusion.                        Joint tenderness is present                        Patient is tender over the medial joint line  Lower Leg:  Has normal appearance and no tenderness or defects  Ankle:  Non-tender and no defects  Foot:  Non-tender and no defects Range of Motion:  Knee:  Range of motion is: 0-100                        Crepitus is  present  Ankle:  Range of motion is normal. Strength and Tone:  The left lower extremity has normal strength and tone. Stability:  Knee:  The knee has positive medial McMurray  Ankle:  The ankle is stable.    x-rays were done of the left knee, reported separately.  The patient has been educated about  the nature of the problem(s) and counseled on treatment options.  The patient appeared to understand what I have discussed and is in agreement with it.  Encounter Diagnosis  Name Primary?  . Chronic pain of left knee Yes   PROCEDURE NOTE:  The patient requests injections of the left knee , verbal consent was obtained.  The left knee was prepped appropriately after time out was performed.   Sterile technique was observed and injection of 1 cc of Depo-Medrol 40 mg with several cc's of plain xylocaine. Anesthesia was provided by ethyl chloride and a 20-gauge needle was used to inject the knee area. The injection was tolerated well.  A band aid dressing was applied.  The patient was advised to apply ice later today and tomorrow to the injection sight as needed.  PLAN Call if any problems.  Precautions discussed.  Continue current medications.   Return to clinic see Dr. Aline Brochure for evaluation for possible knee surgery   Electronically Signed Sanjuana Kava, MD 3/1/20182:02 PM

## 2016-06-22 NOTE — Telephone Encounter (Signed)
Patient of Dr Brooke Bonito is to be scheduled for appointment to discuss surgery of left knee; please advise. [phone]

## 2016-06-23 NOTE — Telephone Encounter (Signed)
Please look at the schedule and make theses appts   I dont need to be asked for every one   Just dont cram it in and over book me  I see [phone]  as appt that are open   Book as a new patient

## 2016-06-30 NOTE — Telephone Encounter (Signed)
Called patient to offer this appointment; left voice mail message to return call.

## 2016-07-04 NOTE — Telephone Encounter (Signed)
I called and LMOM to call office and make appt with Dr Aline Brochure per Dr Aline Brochure. / Gaylyn Cheers

## 2016-07-04 NOTE — Telephone Encounter (Signed)
Followed back up with patient as no response to voice messages. Left message again to return call to schedule.

## 2016-07-06 ENCOUNTER — Encounter: Payer: Self-pay | Admitting: Orthopedic Surgery

## 2016-07-06 NOTE — Telephone Encounter (Signed)
Sent letter to patient, due to no response to phone messages.

## 2016-07-10 NOTE — Telephone Encounter (Signed)
Patient called back in response; appointment scheduled.

## 2016-07-18 ENCOUNTER — Ambulatory Visit (INDEPENDENT_AMBULATORY_CARE_PROVIDER_SITE_OTHER): Payer: 59

## 2016-07-18 ENCOUNTER — Ambulatory Visit (INDEPENDENT_AMBULATORY_CARE_PROVIDER_SITE_OTHER): Payer: 59 | Admitting: Orthopedic Surgery

## 2016-07-18 ENCOUNTER — Encounter: Payer: Self-pay | Admitting: Orthopedic Surgery

## 2016-07-18 VITALS — BP 144/92 | HR 77 | Ht 69.5 in | Wt 257.0 lb

## 2016-07-18 DIAGNOSIS — M25562 Pain in left knee: Secondary | ICD-10-CM | POA: Diagnosis not present

## 2016-07-18 DIAGNOSIS — M23322 Other meniscus derangements, posterior horn of medial meniscus, left knee: Secondary | ICD-10-CM | POA: Diagnosis not present

## 2016-07-18 DIAGNOSIS — M1712 Unilateral primary osteoarthritis, left knee: Secondary | ICD-10-CM | POA: Diagnosis not present

## 2016-07-18 DIAGNOSIS — E748 Other specified disorders of carbohydrate metabolism: Secondary | ICD-10-CM | POA: Diagnosis not present

## 2016-07-18 DIAGNOSIS — I10 Essential (primary) hypertension: Secondary | ICD-10-CM | POA: Diagnosis not present

## 2016-07-18 NOTE — Patient Instructions (Addendum)
SURGERY 08/24/16    You have decided to proceed with operative arthroscopy of the knee. You have decided not to continue with nonoperative measures such as but not limited to oral medication, weight loss, activity modification, physical therapy, bracing, or injection.  We will perform operative arthroscopy of the knee. Some of the risks associated with arthroscopic surgery of the knee include but are not limited to Bleeding Infection Swelling Stiffness Blood clot Pain  If you're not comfortable with these risks and would like to continue with nonoperative treatment please let Dr. Aline Brochure know prior to your surgery.

## 2016-07-18 NOTE — Progress Notes (Signed)
Patient ID: Ryan Hickman, male   DOB: [birth year], 56 y.o.   MRN: [id number]  Chief Complaint  Patient presents with  . Knee Problem    discuss surgery left knee    HPI Ryan Hickman is a 56 y.o. male.  Patient is referred by Dr. Luna Glasgow  The patient has had intermittent pain in his left knee for several years but he fell about a month or so ago on his pain increased and worsened to the point that now is having 9/10 medial knee pain some global pain which is sharp dull nonradiating and appears to be activity related. He has reduced his opiate load  He would like to have arthroscopic surgery in his left knee  Review of Systems Review of Systems  Constitutional: Negative for fever.  Respiratory: Negative for shortness of breath.   Cardiovascular: Negative for chest pain.  Skin: Negative for rash.   (2 MINIMUM)  Past Medical History:  Diagnosis Date  . Acid reflux   . Childhood asthma   . Depression   . History of ETOH abuse   . Hyperlipidemia   . Hypertension   . Insomnia   . Urticaria     Past Surgical History:  Procedure Laterality Date  . COLONOSCOPY  May 2010   Dr. Oneida Alar: simple adenoma, normal colon. Repeat 2020  . EAR CYST EXCISION N/A 11/14/2013   Procedure: EXCISION SEBACEOUS CYST CHEST WALL;  Surgeon: Jamesetta So, MD;  Location: [facility];  Service: General;  Laterality: N/A;  . [place]  . UPPER GASTROINTESTINAL ENDOSCOPY  May 2010   Dr. Oneida Alar: normal    Social History Social History  Substance Use Topics  . Smoking status: Current Every Day Smoker    Packs/day: 3.00    Years: 25.00    Types: Cigarettes    Last attempt to quit: 01/19/2003  . Smokeless tobacco: Never Used  . Alcohol use No     Comment: former    Allergies  Allergen Reactions  . Daypro [Oxaprozin] Anaphylaxis  . Hysingla Er [Hydrocodone Bitartrate Er] Hives  . Levofloxacin Hives    Current Meds  Medication Sig  . amLODipine (NORVASC) 5 MG tablet Take 5 mg by mouth  daily.  Marland Kitchen aspirin EC 81 MG tablet Take 81 mg by mouth every morning.  Marland Kitchen atorvastatin (LIPITOR) 80 MG tablet Take 1 tablet by mouth daily.  . Disulfiram 500 MG TABS Take by mouth.  . enalapril (VASOTEC) 20 MG tablet Take 20 mg by mouth 2 (two) times daily.    Marland Kitchen HYDROcodone-acetaminophen (NORCO) 7.5-325 MG tablet Take 1 tablet by mouth every 6 (six) hours as needed for moderate pain.  Marland Kitchen LORazepam (ATIVAN) 2 MG tablet Take 3 mg by mouth at bedtime as needed (sleep).   Marland Kitchen omeprazole (PRILOSEC) 20 MG capsule Take 20 mg by mouth daily.  . verapamil (VERELAN PM) 360 MG 24 hr capsule Take 360 mg by mouth at bedtime.      Physical Exam Physical Exam 1.BP (!) 144/92   Pulse 77   Ht 5' 9.5" (1.765 m)   Wt 257 lb (116.6 kg)   BMI 37.41 kg/m   2. Gen. appearance. The patient is well-developed and well-nourished, grooming and hygiene are normal. There are no gross congenital abnormalities  3. The patient is alert and oriented to person place and time  4. Mood and affect are normal  5. Ambulation normal gait Examination reveals the following: 6. On inspection we find tenderness  on the medial joint line  7. With the range of motion of  130  8. Stability tests were normal  for all ligaments  9. Strength tests revealed grade 5 motor strength  10. Skin we find no rash ulceration or erythema  11. Sensation remains intact  12 Vascular system shows no peripheral edema  Positive McMurray sign medially  MEDICAL DECISION MAKING:    Data Reviewed His x-ray showed joint space narrowing of the medial side there was no patellofemoral x-ray so that was done today X-ray shows well centered patella with no significant arthritis  Assessment Encounter Diagnoses  Name Primary?  . Left knee pain, unspecified chronicity Yes  . Primary osteoarthritis of left knee   . 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  Arther Abbott 07/18/2016, 9:48 AM

## 2016-07-20 DIAGNOSIS — M170 Bilateral primary osteoarthritis of knee: Secondary | ICD-10-CM | POA: Diagnosis not present

## 2016-07-20 DIAGNOSIS — S83242S Other tear of medial meniscus, current injury, left knee, sequela: Secondary | ICD-10-CM | POA: Diagnosis not present

## 2016-08-14 NOTE — Patient Instructions (Signed)
TRACEY STEWART  08/14/2016     @PREFPERIOPPHARMACY @   Your procedure is scheduled on  08/24/2016   Report to [facility] at  [place].M.  Call this number if you have problems the morning of surgery:  [phone]   Remember:  Do not eat food or drink liquids after midnight.  Take these medicines the morning of surgery with A SIP OF WATER  Amlodipine, vasotec, hydrocodone, prilosec.   Do not wear jewelry, make-up or nail polish.  Do not wear lotions, powders, or perfumes, or deoderant.  Do not shave 48 hours prior to surgery.  Men may shave face and neck.  Do not bring valuables to the hospital.  [facility] is not responsible for any belongings or valuables.  Contacts, dentures or bridgework may not be worn into surgery.  Leave your suitcase in the car.  After surgery it may be brought to your room.  For patients admitted to the hospital, discharge time will be determined by your treatment team.  Patients discharged the day of surgery will not be allowed to drive home.   Name and phone number of your driver:   family Special instructions:  None  Please read over the following fact sheets that you were given. Anesthesia Post-op Instructions and Care and Recovery After Surgery      Knee Ligament Injury, Arthroscopy Arthroscopy is a surgical technique in which your health care provider examines your knee through a small, pencil-sized telescope (arthroscope). Often, repairs to injured ligaments can be done with instruments in the arthroscope. Arthroscopy is less invasive than open-knee surgery. Tell a health care provider about:  Any allergies you have.  All medicines you are taking, including vitamins, herbs, eye drops, creams, and over-the-counter medicines.  Any problems you or family members have had with anesthetic medicines.  Any blood disorders you have.  Any surgeries you have had.  Any medical conditions you have. What are the risks? Generally, this  is a safe procedure. However, as with any procedure, problems can occur. Possible problems include:  Infection.  Bleeding.  Stiffness. What happens before the procedure?  Ask your health care provider about changing or stopping any regular medicines. Avoid taking aspirin or blood thinners as directed by your health care provider.  Do not eat or drink anything after midnight the night before surgery.  If you smoke, do not smoke for at least 2 weeks before your surgery.  Do not drink alcohol starting the day before your surgery.  Let your health care provider know if you develop a cold or any infection before your surgery.  Arrange for someone to drive you home after the surgery or after your hospital stay. Also arrange for someone to help you with activities during recovery. What happens during the procedure?  Small monitors will be put on your body. They are used to check your heart, blood pressure, and oxygen levels.  An IV access tube will be put into one of your veins. Medicine will be able to flow directly into your body through this IV tube.  You might be given a medicine to help you relax (sedative).  You will be given a medicine that makes you go to sleep (general anesthetic), and a breathing tube will be placed into your lungs during the procedure.  Several small incisions are made in your knee. Saline fluid is placed into one of the incisions to expand the knee and clear away any  blood in the knee.  Your health care provider will insert the arthroscope to examine the injured knee.  During arthroscopy, your health care provider may find a partial or complete tear in a ligament.  Tools can be inserted through the other incisions to repair the injured ligaments.  The incisions are then closed with absorbable stitches and covered with dressings. What happens after the procedure?  You will be taken to the recovery area where you will be monitored.  When you are awake,  stable, and taking fluids without problems, you will be allowed to go home. This information is not intended to replace advice given to you by your health care provider. Make sure you discuss any questions you have with your health care provider. Document Released: 04/07/2000 Document Revised: 09/16/2015 Document Reviewed: 11/20/2012 Elsevier Interactive Patient Education  2017 [place].  Arthroscopic Knee Ligament Repair, Care After This sheet gives you information about how to care for yourself after your procedure. Your health care provider may also give you more specific instructions. If you have problems or questions, contact your health care provider. What can I expect after the procedure? After the procedure, it is common to have:  Pain in your knee.  Bruising and swelling on your knee, calf, and ankle for 3-4 days.  Fatigue. Follow these instructions at home: If you have a brace or immobilizer:   Wear the brace or immobilizer as told by your health care provider. Remove it only as told by your health care provider.  Loosen the splint or immobilizer if your toes tingle, become numb, or turn cold and blue.  Keep the brace or immobilizer clean. Bathing   Do not take baths, swim, or use a hot tub until your health care provider approves. Ask your health care provider if you can take showers.  Keep your bandage (dressing) dry until your health care provider says that it can be removed. Cover it and your brace or immobilizer with a watertight covering when you take a shower. Incision care   Follow instructions from your health care provider about how to take care of your incision. Make sure you:  Wash your hands with soap and water before you change your bandage (dressing). If soap and water are not available, use hand sanitizer.  Change your dressing as told by your health care provider.  Leave stitches (sutures), skin glue, or adhesive strips in place. These skin closures  may need to stay in place for 2 weeks or longer. If adhesive strip edges start to loosen and curl up, you may trim the loose edges. Do not remove adhesive strips completely unless your health care provider tells you to do that.  Check your incision area every day for signs of infection. Check for:  More redness, swelling, or pain.  More fluid or blood.  Warmth.  Pus or a bad smell. Managing pain, stiffness, and swelling   If directed, put ice on the affected area.  If you have a removable brace or immobilizer, remove it as told by your health care provider.  Put ice in a plastic bag.  Place a towel between your skin and the bag or between your brace or immobilizer and the bag.  Leave the ice on for 20 minutes, 2-3 times a day.  Move your toes often to avoid stiffness and to lessen swelling.  Raise (elevate) the injured area above the level of your heart while you are sitting or lying down. Driving   Do not  drive until your health care provider approves. If you have a brace or immobilizer on your leg, ask your health care provider when it is safe for you to drive.  Do not drive or use heavy machinery while taking prescription pain medicine. Activity   Rest as directed. Ask your health care provider what activities are safe for you.  Do physical therapy exercises as told by your health care provider. Physical therapy will help you regain strength and motion in your knee.  Follow instructions from your health care provider about:  When you may start motion exercises.  When you may start riding a stationary bike and doing other low-impact activities.  When you may start to jog and do other high-impact activities. Safety   Do not use the injured limb to support your body weight until your health care provider says that you can. Use crutches as told by your health care provider. General instructions   Do not use any products that contain nicotine or tobacco, such as  cigarettes and e-cigarettes. These can delay bone healing. If you need help quitting, ask your health care provider.  To prevent or treat constipation while you are taking prescription pain medicine, your health care provider may recommend that you:  Drink enough fluid to keep your urine clear or pale yellow.  Take over-the-counter or prescription medicines.  Eat foods that are high in fiber, such as fresh fruits and vegetables, whole grains, and beans.  Limit foods that are high in fat and processed sugars, such as fried and sweet foods.  Take over-the-counter and prescription medicines only as told by your health care provider.  Keep all follow-up visits as told by your health care provider. This is important. Contact a health care provider if:  You have more redness, swelling, or pain around an incision.  You have more fluid or blood coming from an incision.  Your incision feels warm to the touch.  You have a fever.  You have pain or swelling in your knee, and it gets worse.  You have pain that does not get better with medicine. Get help right away if:  You have trouble breathing.  You have pus or a bad smell coming from an incision.  You have numbness and tingling near the knee joint. Summary  After the procedure, it is common to have knee pain with bruising and swelling on your knee, calf, and ankle.  Icing your knee and raising your leg above the level of your heart will help control the pain and the swelling.  Do physical therapy exercises as told by your health care provider. Physical therapy will help you regain strength and motion in your knee. This information is not intended to replace advice given to you by your health care provider. Make sure you discuss any questions you have with your health care provider. Document Released: 01/29/2013 Document Revised: 04/04/2016 Document Reviewed: 04/04/2016 Elsevier Interactive Patient Education  2017 [employer] PATIENT INSTRUCTIONS POST-ANESTHESIA  IMMEDIATELY FOLLOWING SURGERY:  Do not drive or operate machinery for the first twenty four hours after surgery.  Do not make any important decisions for twenty four hours after surgery or while taking narcotic pain medications or sedatives.  If you develop intractable nausea and vomiting or a severe headache please notify your doctor immediately.  FOLLOW-UP:  Please make an appointment with your surgeon as instructed. You do not need to follow up with anesthesia unless specifically instructed to do so.  WOUND CARE INSTRUCTIONS (if  applicable):  Keep a dry clean dressing on the anesthesia/puncture wound site if there is drainage.  Once the wound has quit draining you may leave it open to air.  Generally you should leave the bandage intact for twenty four hours unless there is drainage.  If the epidural site drains for more than 36-48 hours please call the anesthesia department.  QUESTIONS?:  Please feel free to call your physician or the hospital operator if you have any questions, and they will be happy to assist you.

## 2016-08-18 ENCOUNTER — Encounter (HOSPITAL_COMMUNITY): Payer: Self-pay

## 2016-08-18 ENCOUNTER — Encounter (HOSPITAL_COMMUNITY)
Admission: RE | Admit: 2016-08-18 | Discharge: 2016-08-18 | Disposition: A | Discharge status: Home / Self Care | Payer: 59 | Source: Ambulatory Visit | Attending: Orthopedic Surgery | Admitting: Orthopedic Surgery

## 2016-08-18 DIAGNOSIS — I4510 Unspecified right bundle-branch block: Secondary | ICD-10-CM | POA: Insufficient documentation

## 2016-08-18 DIAGNOSIS — R9431 Abnormal electrocardiogram [ECG] [EKG]: Secondary | ICD-10-CM | POA: Insufficient documentation

## 2016-08-18 DIAGNOSIS — Z01812 Encounter for preprocedural laboratory examination: Secondary | ICD-10-CM | POA: Diagnosis present

## 2016-08-18 HISTORY — DX: Sleep apnea, unspecified: G47.30

## 2016-08-18 HISTORY — DX: Unspecified osteoarthritis, unspecified site: M19.90

## 2016-08-18 LAB — BASIC METABOLIC PANEL
Anion gap: 10 (ref 5–15)
BUN: 13 mg/dL (ref 6–20)
CALCIUM: 9.3 mg/dL (ref 8.9–10.3)
CO2: 26 mmol/L (ref 22–32)
CREATININE: 0.95 mg/dL (ref 0.61–1.24)
Chloride: 102 mmol/L (ref 101–111)
GFR calc non Af Amer: >60 mL/min (ref >60)
Glucose, Bld: 150 mg/dL — ABNORMAL HIGH (ref 65–99)
Potassium: 3.9 mmol/L (ref 3.5–5.1)
SODIUM: 138 mmol/L (ref 135–145)

## 2016-08-18 LAB — CBC WITH DIFFERENTIAL/PLATELET
BASOS ABS: 0 10*3/uL (ref 0.0–0.1)
BASOS PCT: 0 %
EOS ABS: 0.6 10*3/uL (ref 0.0–0.7)
Eosinophils Relative: 6 %
HCT: 45.9 % (ref 39.0–52.0)
HEMOGLOBIN: 15.9 g/dL (ref 13.0–17.0)
Lymphocytes Relative: 20 %
Lymphs Abs: 1.8 10*3/uL (ref 0.7–4.0)
MCH: 30.9 pg (ref 26.0–34.0)
MCHC: 34.6 g/dL (ref 30.0–36.0)
MCV: 89.1 fL (ref 78.0–100.0)
MONOS PCT: 6 %
Monocytes Absolute: 0.6 10*3/uL (ref 0.1–1.0)
NEUTROS PCT: 68 %
Neutro Abs: 6.1 10*3/uL (ref 1.7–7.7)
Platelets: 188 10*3/uL (ref 150–400)
RBC: 5.15 MIL/uL (ref 4.22–5.81)
RDW: 13.4 % (ref 11.5–15.5)
WBC: 9 10*3/uL (ref 4.0–10.5)

## 2016-08-18 LAB — SURGICAL PCR SCREEN
MRSA, PCR: NEGATIVE
Staphylococcus aureus: NEGATIVE

## 2016-08-21 ENCOUNTER — Encounter: Payer: Self-pay | Admitting: *Deleted

## 2016-08-21 NOTE — Progress Notes (Signed)
CPT [phone] APPROVED

## 2016-08-23 NOTE — H&P (Signed)
Chief Complaint  Patient presents with  . Knee Problem      discuss surgery left knee      HPI Ryan Hickman is a 56 y.o. male.  Patient is referred by Dr. Luna Glasgow   The patient has had intermittent pain in his left knee for several years but he fell about a month or so ago on his pain increased and worsened to the point that now is having 9/10 medial knee pain some global pain which is sharp dull nonradiating and appears to be activity related. He has reduced his opiate load   He would like to have arthroscopic surgery in his left knee   Review of Systems Review of Systems  Constitutional: Negative for fever.  Respiratory: Negative for shortness of breath.   Cardiovascular: Negative for chest pain.  Skin: Negative for rash.    (2 MINIMUM)       Past Medical History:  Diagnosis Date  . Acid reflux    . Childhood asthma    . Depression    . History of ETOH abuse    . Hyperlipidemia    . Hypertension    . Insomnia    . Urticaria             Past Surgical History:  Procedure Laterality Date  . COLONOSCOPY   May 2010    Dr. Oneida Alar: simple adenoma, normal colon. Repeat 2020  . EAR CYST EXCISION N/A 11/14/2013    Procedure: EXCISION SEBACEOUS CYST CHEST WALL;  Surgeon: Jamesetta So, MD;  Location: [facility];  Service: General;  Laterality: N/A;  . [place]  . UPPER GASTROINTESTINAL ENDOSCOPY   May 2010    Dr. Oneida Alar: normal      Social History       Social History  Substance Use Topics  . Smoking status: Current Every Day Smoker      Packs/day: 3.00      Years: 25.00      Types: Cigarettes      Last attempt to quit: 01/19/2003  . Smokeless tobacco: Never Used  . Alcohol use No        Comment: former          Allergies  Allergen Reactions  . Daypro [Oxaprozin] Anaphylaxis  . Hysingla Er [Hydrocodone Bitartrate Er] Hives  . Levofloxacin Hives      Active Medications      Current Meds  Medication Sig  . amLODipine (NORVASC) 5 MG tablet Take 5  mg by mouth daily.  Marland Kitchen aspirin EC 81 MG tablet Take 81 mg by mouth every morning.  Marland Kitchen atorvastatin (LIPITOR) 80 MG tablet Take 1 tablet by mouth daily.  . Disulfiram 500 MG TABS Take by mouth.  . enalapril (VASOTEC) 20 MG tablet Take 20 mg by mouth 2 (two) times daily.    Marland Kitchen HYDROcodone-acetaminophen (NORCO) 7.5-325 MG tablet Take 1 tablet by mouth every 6 (six) hours as needed for moderate pain.  Marland Kitchen LORazepam (ATIVAN) 2 MG tablet Take 3 mg by mouth at bedtime as needed (sleep).   Marland Kitchen omeprazole (PRILOSEC) 20 MG capsule Take 20 mg by mouth daily.  . verapamil (VERELAN PM) 360 MG 24 hr capsule Take 360 mg by mouth at bedtime.            Physical Exam Physical Exam 1.BP (!) 144/92   Pulse 77   Ht 5' 9.5" (1.765 m)   Wt 257 lb (116.6 kg)   BMI 37.41 kg/m  2. Gen. appearance. The patient is well-developed and well-nourished, grooming and hygiene are normal. There are no gross congenital abnormalities   3. The patient is alert and oriented to person place and time   4. Mood and affect are normal   5. Ambulation normal gait Examination reveals the following: 6. On inspection we find tenderness on the medial joint line   7. With the range of motion of  130   8. Stability tests were normal  for all ligaments   9. Strength tests revealed grade 5 motor strength   10. Skin we find no rash ulceration or erythema   11. Sensation remains intact   12 Vascular system shows no peripheral edema   Positive McMurray sign medially   MEDICAL DECISION MAKING:    Data Reviewed His x-ray showed joint space narrowing of the medial side there was no patellofemoral x-ray so that was done today X-ray shows well centered patella with no significant arthritis   Assessment     Encounter Diagnoses  Name Primary?  . Left knee pain, unspecified chronicity Yes  . Primary osteoarthritis of left knee    . Derangement of posterior horn of medial meniscus of left knee        Plan Salk med men     This procedure has been fully reviewed with the patient and written informed consent has been obtained.

## 2016-08-24 ENCOUNTER — Encounter (HOSPITAL_COMMUNITY): Payer: Self-pay | Admitting: *Deleted

## 2016-08-24 ENCOUNTER — Ambulatory Visit (HOSPITAL_COMMUNITY): Payer: 59 | Admitting: Anesthesiology

## 2016-08-24 ENCOUNTER — Ambulatory Visit (HOSPITAL_COMMUNITY)
Admission: RE | Admit: 2016-08-24 | Discharge: 2016-08-24 | Disposition: A | Discharge status: Home / Self Care | Payer: 59 | Source: Ambulatory Visit | Attending: Orthopedic Surgery | Admitting: Orthopedic Surgery

## 2016-08-24 ENCOUNTER — Encounter (HOSPITAL_COMMUNITY)
Admission: RE | Disposition: A | Discharge status: Home / Self Care | Payer: Self-pay | Source: Ambulatory Visit | Attending: Orthopedic Surgery

## 2016-08-24 DIAGNOSIS — G4700 Insomnia, unspecified: Secondary | ICD-10-CM | POA: Diagnosis not present

## 2016-08-24 DIAGNOSIS — Z888 Allergy status to other drugs, medicaments and biological substances status: Secondary | ICD-10-CM | POA: Diagnosis not present

## 2016-08-24 DIAGNOSIS — I10 Essential (primary) hypertension: Secondary | ICD-10-CM | POA: Diagnosis not present

## 2016-08-24 DIAGNOSIS — F17210 Nicotine dependence, cigarettes, uncomplicated: Secondary | ICD-10-CM | POA: Insufficient documentation

## 2016-08-24 DIAGNOSIS — Z79899 Other long term (current) drug therapy: Secondary | ICD-10-CM | POA: Diagnosis not present

## 2016-08-24 DIAGNOSIS — F329 Major depressive disorder, single episode, unspecified: Secondary | ICD-10-CM | POA: Insufficient documentation

## 2016-08-24 DIAGNOSIS — E785 Hyperlipidemia, unspecified: Secondary | ICD-10-CM | POA: Diagnosis not present

## 2016-08-24 DIAGNOSIS — W19XXXA Unspecified fall, initial encounter: Secondary | ICD-10-CM | POA: Insufficient documentation

## 2016-08-24 DIAGNOSIS — L509 Urticaria, unspecified: Secondary | ICD-10-CM | POA: Insufficient documentation

## 2016-08-24 DIAGNOSIS — S83242A Other tear of medial meniscus, current injury, left knee, initial encounter: Secondary | ICD-10-CM | POA: Insufficient documentation

## 2016-08-24 DIAGNOSIS — G4730 Sleep apnea, unspecified: Secondary | ICD-10-CM | POA: Diagnosis not present

## 2016-08-24 DIAGNOSIS — Z7982 Long term (current) use of aspirin: Secondary | ICD-10-CM | POA: Insufficient documentation

## 2016-08-24 DIAGNOSIS — K219 Gastro-esophageal reflux disease without esophagitis: Secondary | ICD-10-CM | POA: Insufficient documentation

## 2016-08-24 DIAGNOSIS — M23322 Other meniscus derangements, posterior horn of medial meniscus, left knee: Secondary | ICD-10-CM

## 2016-08-24 HISTORY — PX: KNEE ARTHROSCOPY WITH MEDIAL MENISECTOMY: SHX5651

## 2016-08-24 SURGERY — ARTHROSCOPY, KNEE, WITH MEDIAL MENISCECTOMY
Anesthesia: General | Site: Knee | Laterality: Left

## 2016-08-24 MED ORDER — MIDAZOLAM HCL 2 MG/2ML IJ SOLN
1.0000 mg | INTRAMUSCULAR | Status: AC
  Administered 2016-08-24: 2 mg via INTRAVENOUS

## 2016-08-24 MED ORDER — HYDROCODONE-ACETAMINOPHEN 10-325 MG PO TABS: 1.0000 | ORAL_TABLET | Freq: Three times a day (TID) | ORAL | 0 refills | Status: DC | PRN

## 2016-08-24 MED ORDER — ONDANSETRON HCL 4 MG/2ML IJ SOLN
INTRAMUSCULAR | Status: AC
  Filled 2016-08-24: qty 2

## 2016-08-24 MED ORDER — ONDANSETRON HCL 4 MG/2ML IJ SOLN
4.0000 mg | Freq: Once | INTRAMUSCULAR | Status: AC
  Administered 2016-08-24: 4 mg via INTRAVENOUS
  Filled 2016-08-24: qty 2

## 2016-08-24 MED ORDER — CHLORHEXIDINE GLUCONATE 4 % EX LIQD: 60.0000 mL | Freq: Once | CUTANEOUS | Status: DC

## 2016-08-24 MED ORDER — EPHEDRINE SULFATE 50 MG/ML IJ SOLN
INTRAMUSCULAR | Status: AC
  Filled 2016-08-24: qty 1

## 2016-08-24 MED ORDER — FENTANYL CITRATE (PF) 250 MCG/5ML IJ SOLN
INTRAMUSCULAR | Status: AC
  Filled 2016-08-24: qty 5

## 2016-08-24 MED ORDER — CEFAZOLIN SODIUM-DEXTROSE 2-4 GM/100ML-% IV SOLN
INTRAVENOUS | Status: AC
  Filled 2016-08-24: qty 100

## 2016-08-24 MED ORDER — HYDROCODONE-ACETAMINOPHEN 7.5-325 MG PO TABS
1.0000 | ORAL_TABLET | Freq: Once | ORAL | Status: AC
  Administered 2016-08-24: 1 via ORAL
  Filled 2016-08-24: qty 1

## 2016-08-24 MED ORDER — EPINEPHRINE PF 1 MG/ML IJ SOLN
INTRAMUSCULAR | Status: AC
  Filled 2016-08-24: qty 3

## 2016-08-24 MED ORDER — CEFAZOLIN SODIUM-DEXTROSE 2-4 GM/100ML-% IV SOLN
2.0000 g | INTRAVENOUS | Status: AC
  Administered 2016-08-24: 2 g via INTRAVENOUS

## 2016-08-24 MED ORDER — PROPOFOL 10 MG/ML IV BOLUS
INTRAVENOUS | Status: AC
  Filled 2016-08-24: qty 20

## 2016-08-24 MED ORDER — BUPIVACAINE-EPINEPHRINE (PF) 0.5% -1:200000 IJ SOLN
INTRAMUSCULAR | Status: AC
  Filled 2016-08-24: qty 60

## 2016-08-24 MED ORDER — DIPHENHYDRAMINE HCL 50 MG/ML IJ SOLN
25.0000 mg | Freq: Once | INTRAMUSCULAR | Status: AC
  Administered 2016-08-24: 25 mg via INTRAVENOUS
  Filled 2016-08-24: qty 1

## 2016-08-24 MED ORDER — SODIUM CHLORIDE 0.9 % IJ SOLN
INTRAMUSCULAR | Status: AC
  Filled 2016-08-24: qty 10

## 2016-08-24 MED ORDER — FENTANYL CITRATE (PF) 100 MCG/2ML IJ SOLN
25.0000 ug | INTRAMUSCULAR | Status: DC | PRN
  Administered 2016-08-24 (×2): 50 ug via INTRAVENOUS
  Filled 2016-08-24: qty 2

## 2016-08-24 MED ORDER — LIDOCAINE HCL (CARDIAC) 10 MG/ML IV SOLN
INTRAVENOUS | Status: DC | PRN
  Administered 2016-08-24: 50 mg via INTRAVENOUS

## 2016-08-24 MED ORDER — SODIUM CHLORIDE 0.9 % IR SOLN
Status: DC | PRN
  Administered 2016-08-24 (×3): 3000 mL

## 2016-08-24 MED ORDER — EPHEDRINE SULFATE 50 MG/ML IJ SOLN
INTRAMUSCULAR | Status: DC | PRN
  Administered 2016-08-24: 10 mg via INTRAVENOUS

## 2016-08-24 MED ORDER — BUPIVACAINE-EPINEPHRINE (PF) 0.5% -1:200000 IJ SOLN
INTRAMUSCULAR | Status: DC | PRN
  Administered 2016-08-24: 60 mL

## 2016-08-24 MED ORDER — PROPOFOL 10 MG/ML IV BOLUS
INTRAVENOUS | Status: DC | PRN
  Administered 2016-08-24: 200 mg via INTRAVENOUS

## 2016-08-24 MED ORDER — MIDAZOLAM HCL 2 MG/2ML IJ SOLN
INTRAMUSCULAR | Status: AC
  Filled 2016-08-24: qty 2

## 2016-08-24 MED ORDER — GLYCOPYRROLATE 0.2 MG/ML IJ SOLN
0.2000 mg | Freq: Once | INTRAMUSCULAR | Status: AC
  Administered 2016-08-24: 0.2 mg via INTRAVENOUS

## 2016-08-24 MED ORDER — GLYCOPYRROLATE 0.2 MG/ML IJ SOLN
INTRAMUSCULAR | Status: AC
  Filled 2016-08-24: qty 1

## 2016-08-24 MED ORDER — FENTANYL CITRATE (PF) 100 MCG/2ML IJ SOLN
INTRAMUSCULAR | Status: DC | PRN
  Administered 2016-08-24: 25 ug via INTRAVENOUS
  Administered 2016-08-24: 50 ug via INTRAVENOUS
  Administered 2016-08-24 (×4): 25 ug via INTRAVENOUS
  Administered 2016-08-24: 50 ug via INTRAVENOUS
  Administered 2016-08-24: 25 ug via INTRAVENOUS

## 2016-08-24 MED ORDER — SODIUM CHLORIDE 0.9 % IR SOLN
Status: DC | PRN
  Administered 2016-08-24: 1000 mL

## 2016-08-24 MED ORDER — ONDANSETRON HCL 4 MG/2ML IJ SOLN
4.0000 mg | Freq: Once | INTRAMUSCULAR | Status: AC
  Administered 2016-08-24: 4 mg via INTRAVENOUS

## 2016-08-24 MED ORDER — LACTATED RINGERS IV SOLN
INTRAVENOUS | Status: DC
  Administered 2016-08-24: 1000 mL via INTRAVENOUS

## 2016-08-24 SURGICAL SUPPLY — 54 items
ARTHROWAND PARAGON T2 (SURGICAL WAND)
BAG HAMPER (MISCELLANEOUS) ×3 IMPLANT
BANDAGE ELASTIC 6 LF NS (GAUZE/BANDAGES/DRESSINGS) ×3 IMPLANT
BLADE AGGRESSIVE PLUS 4.0 (BLADE) ×3 IMPLANT
BLADE SURG SZ11 CARB STEEL (BLADE) ×3 IMPLANT
BNDG CMPR MED 5X6 ELC HKLP NS (GAUZE/BANDAGES/DRESSINGS) ×1
CHLORAPREP W/TINT 26ML (MISCELLANEOUS) ×3 IMPLANT
CLOTH BEACON ORANGE TIMEOUT ST (SAFETY) ×3 IMPLANT
COOLER CRYO IC GRAV AND TUBE (ORTHOPEDIC SUPPLIES) ×3 IMPLANT
CUFF CRYO KNEE LG 20X31 COOLER (ORTHOPEDIC SUPPLIES) IMPLANT
CUFF CRYO KNEE18X23 MED (MISCELLANEOUS) ×2 IMPLANT
CUFF TOURNIQUET SINGLE 34IN LL (TOURNIQUET CUFF) ×2 IMPLANT
CUTTER ANGLED DBL BITE 4.5 (BURR) IMPLANT
DECANTER SPIKE VIAL GLASS SM (MISCELLANEOUS) ×6 IMPLANT
GAUZE SPONGE 4X4 12PLY STRL (GAUZE/BANDAGES/DRESSINGS) ×3 IMPLANT
GAUZE SPONGE 4X4 16PLY XRAY LF (GAUZE/BANDAGES/DRESSINGS) ×3 IMPLANT
GAUZE XEROFORM 5X9 LF (GAUZE/BANDAGES/DRESSINGS) ×3 IMPLANT
GLOVE BIOGEL PI IND STRL 7.0 (GLOVE) ×1 IMPLANT
GLOVE BIOGEL PI INDICATOR 7.0 (GLOVE) ×2
GLOVE SKINSENSE NS SZ8.0 LF (GLOVE) ×2
GLOVE SKINSENSE STRL SZ8.0 LF (GLOVE) ×1 IMPLANT
GLOVE SS N UNI LF 8.5 STRL (GLOVE) ×3 IMPLANT
GOWN STRL REUS W/ TWL LRG LVL3 (GOWN DISPOSABLE) ×1 IMPLANT
GOWN STRL REUS W/TWL LRG LVL3 (GOWN DISPOSABLE) ×3
GOWN STRL REUS W/TWL XL LVL3 (GOWN DISPOSABLE) ×3 IMPLANT
HLDR LEG FOAM (MISCELLANEOUS) ×1 IMPLANT
IV NS IRRIG 3000ML ARTHROMATIC (IV SOLUTION) ×8 IMPLANT
KIT BLADEGUARD II DBL (SET/KITS/TRAYS/PACK) ×3 IMPLANT
KIT ROOM TURNOVER AP CYSTO (KITS) ×3 IMPLANT
LEG HOLDER FOAM (MISCELLANEOUS) ×2
MANIFOLD NEPTUNE II (INSTRUMENTS) ×3 IMPLANT
MARKER SKIN DUAL TIP RULER LAB (MISCELLANEOUS) ×3 IMPLANT
NDL HYPO 18GX1.5 BLUNT FILL (NEEDLE) ×1 IMPLANT
NDL SPNL 18GX3.5 QUINCKE PK (NEEDLE) ×1 IMPLANT
NEEDLE HYPO 18GX1.5 BLUNT FILL (NEEDLE) ×3 IMPLANT
NEEDLE HYPO 21X1.5 SAFETY (NEEDLE) ×3 IMPLANT
NEEDLE SPNL 18GX3.5 QUINCKE PK (NEEDLE) ×3 IMPLANT
NS IRRIG 1000ML POUR BTL (IV SOLUTION) ×3 IMPLANT
PACK ARTHRO LIMB DRAPE STRL (MISCELLANEOUS) ×3 IMPLANT
PAD ABD 5X9 TENDERSORB (GAUZE/BANDAGES/DRESSINGS) ×3 IMPLANT
PAD ARMBOARD 7.5X6 YLW CONV (MISCELLANEOUS) ×3 IMPLANT
PADDING CAST COTTON 6X4 STRL (CAST SUPPLIES) ×3 IMPLANT
PADDING WEBRIL 6 STERILE (GAUZE/BANDAGES/DRESSINGS) ×2 IMPLANT
PROBE BIPOLAR 50 DEGREE SUCT (MISCELLANEOUS) ×3 IMPLANT
PROBE BIPOLAR ATHRO 135MM 90D (MISCELLANEOUS) ×1 IMPLANT
SET ARTHROSCOPY INST (INSTRUMENTS) ×3 IMPLANT
SET ARTHROSCOPY PUMP TUBE (IRRIGATION / IRRIGATOR) ×3 IMPLANT
SET BASIN LINEN APH (SET/KITS/TRAYS/PACK) ×3 IMPLANT
SUT ETHILON 3 0 FSL (SUTURE) ×3 IMPLANT
SYR 30ML LL (SYRINGE) ×3 IMPLANT
SYRINGE 10CC LL (SYRINGE) ×3 IMPLANT
TUBE CONNECTING 12'X1/4 (SUCTIONS) ×3
TUBE CONNECTING 12X1/4 (SUCTIONS) ×6 IMPLANT
WAND ARTHRO PARAGON T2 (SURGICAL WAND) IMPLANT

## 2016-08-24 NOTE — Anesthesia Procedure Notes (Signed)
Procedure Name: LMA Insertion Date/Time: 08/24/2016 10:50 AM Performed by: Tressie Stalker E Pre-anesthesia Checklist: Patient identified, Patient being monitored, Emergency Drugs available, Timeout performed and Suction available Patient Re-evaluated:Patient Re-evaluated prior to inductionOxygen Delivery Method: Circle System Utilized Preoxygenation: Pre-oxygenation with 100% oxygen Intubation Type: IV induction Ventilation: Mask ventilation without difficulty LMA: LMA inserted LMA Size: 4.0 Number of attempts: 1 Placement Confirmation: positive ETCO2 and breath sounds checked- equal and bilateral

## 2016-08-24 NOTE — Op Note (Signed)
08/24/2016  11:43 AM  PATIENT:  Ryan Hickman  56 y.o. male  PRE-OPERATIVE DIAGNOSIS:  LEFT KNEE MEDIAL MENISCUS TEAR  POST-OPERATIVE DIAGNOSIS:  LEFT KNEE MEDIAL MENISCUS TEAR  PROCEDURE:  Procedure(s): KNEE ARTHROSCOPY WITH MEDIAL MENISECTOMY (Left)  Isolated medial meniscus tear , articular cartilage normal   [id number]  SURGEON:  Surgeon(s) and Role:    * Carole Civil, MD - Primary  Knee arthroscopy dictation  The patient was identified in the preoperative holding area using 2 approved identification mechanisms. The chart was reviewed and updated. The surgical site was confirmed as left  knee and marked with an indelible marker.  The patient was taken to the operating room for anesthesia. After successful  General  anesthesia, ancef  was used as IV antibiotics.  The patient was placed in the supine position with the (left knee and leg ) the operative extremity in an arthroscopic leg holder and the opposite extremity in a padded leg holder.  The timeout was executed.  A lateral portal was established with an 11 blade and the scope was introduced into the joint. A diagnostic arthroscopy was performed in circumferential manner examining the entire knee joint. A medial portal was established and the diagnostic arthroscopy was repeated using a probe to palpate intra-articular structures as they were encountered.   The operative findings are   Medial torn medial meniscus posterior horn complex tear cartilage normal on the articular surface Lateral normal meniscus normal articular cartilage Patellofemoral normal articular cartilage Notch normal anterior cruciate ligament PCL   The medial meniscus was resected using a duckbill forceps. The meniscal fragments were removed with a motorized shaver. The meniscus was balanced with a combination of a motorized shaver and a 50 ArthroCare wand until a stable rim was obtained.   The arthroscopic pump was placed on the wash mode and any  excess debris was removed from the joint using suction.  60 cc of Marcaine with epinephrine was injected through the arthroscope.  The portals were closed with 3-0 nylon suture.  A sterile bandage, Ace wrap and Cryo/Cuff was placed and the Cryo/Cuff was activated. The patient was taken to the recovery room in stable condition.   PHYSICIAN ASSISTANT:   ASSISTANTS: none   ANESTHESIA:   general  EBL:  Total I/O In: 500 [I.V.:500] Out: 0   BLOOD ADMINISTERED:none  DRAINS: none   LOCAL MEDICATIONS USED:  MARCAINE     SPECIMEN:  No Specimen  DISPOSITION OF SPECIMEN:  N/A  COUNTS:  YES  TOURNIQUET:    DICTATION: .Dragon Dictation  PLAN OF CARE: Discharge to home after PACU  PATIENT DISPOSITION:  PACU - hemodynamically stable.   Delay start of Pharmacological VTE agent (>24hrs) due to surgical blood loss or risk of bleeding: not applicable

## 2016-08-24 NOTE — Interval H&P Note (Signed)
History and Physical Interval Note:  08/24/2016 10:34 AM  Ryan Hickman  has presented today for surgery, with the diagnosis of LEFT KNEE MEDIAL MENISCUS TEAR  The various methods of treatment have been discussed with the patient and family. After consideration of risks, benefits and other options for treatment, the patient has consented to  Procedure(s): KNEE ARTHROSCOPY WITH MEDIAL MENISECTOMY (Left) as a surgical intervention .  The patient's history has been reviewed, patient examined, no change in status, stable for surgery.  I have reviewed the patient's chart and labs.  Questions were answered to the patient's satisfaction.     Arther Abbott

## 2016-08-24 NOTE — Brief Op Note (Signed)
08/24/2016  11:43 AM  PATIENT:  Gwendlyn Deutscher  56 y.o. male  PRE-OPERATIVE DIAGNOSIS:  LEFT KNEE MEDIAL MENISCUS TEAR  POST-OPERATIVE DIAGNOSIS:  LEFT KNEE MEDIAL MENISCUS TEAR  PROCEDURE:  Procedure(s): KNEE ARTHROSCOPY WITH MEDIAL MENISECTOMY (Left)  Isolated medial meniscus tear , articular cartilage normal   [address]  SURGEON:  Surgeon(s) and Role:    * Carole Civil, MD - Primary  Knee arthroscopy dictation  The patient was identified in the preoperative holding area using 2 approved identification mechanisms. The chart was reviewed and updated. The surgical site was confirmed as left  knee and marked with an indelible marker.  The patient was taken to the operating room for anesthesia. After successful  General  anesthesia, ancef  was used as IV antibiotics.  The patient was placed in the supine position with the (left knee and leg ) the operative extremity in an arthroscopic leg holder and the opposite extremity in a padded leg holder.  The timeout was executed.  A lateral portal was established with an 11 blade and the scope was introduced into the joint. A diagnostic arthroscopy was performed in circumferential manner examining the entire knee joint. A medial portal was established and the diagnostic arthroscopy was repeated using a probe to palpate intra-articular structures as they were encountered.   The operative findings are   Medial torn medial meniscus posterior horn complex tear cartilage normal on the articular surface Lateral normal meniscus normal articular cartilage Patellofemoral normal articular cartilage Notch normal anterior cruciate ligament PCL   The medial meniscus was resected using a duckbill forceps. The meniscal fragments were removed with a motorized shaver. The meniscus was balanced with a combination of a motorized shaver and a 50 ArthroCare wand until a stable rim was obtained.   The arthroscopic pump was placed on the wash mode and any  excess debris was removed from the joint using suction.  60 cc of Marcaine with epinephrine was injected through the arthroscope.  The portals were closed with 3-0 nylon suture.  A sterile bandage, Ace wrap and Cryo/Cuff was placed and the Cryo/Cuff was activated. The patient was taken to the recovery room in stable condition.   PHYSICIAN ASSISTANT:   ASSISTANTS: none   ANESTHESIA:   general  EBL:  Total I/O In: 500 [I.V.:500] Out: 0   BLOOD ADMINISTERED:none  DRAINS: none   LOCAL MEDICATIONS USED:  MARCAINE     SPECIMEN:  No Specimen  DISPOSITION OF SPECIMEN:  N/A  COUNTS:  YES  TOURNIQUET:    DICTATION: .Dragon Dictation  PLAN OF CARE: Discharge to home after PACU  PATIENT DISPOSITION:  PACU - hemodynamically stable.   Delay start of Pharmacological VTE agent (>24hrs) due to surgical blood loss or risk of bleeding: not applicable

## 2016-08-24 NOTE — Anesthesia Preprocedure Evaluation (Signed)
Anesthesia Evaluation  Patient identified by MRN, date of birth, ID band Patient awake    Reviewed: Allergy & Precautions, H&P , NPO status , Patient's Chart, lab work & pertinent test results  Airway Mallampati: II  TM Distance: >3 FB Neck ROM: Full    Dental  (+) Poor Dentition, Missing   Pulmonary asthma , sleep apnea , Current Smoker, former smoker,    breath sounds clear to auscultation       Cardiovascular hypertension, Pt. on medications  Rhythm:Regular Rate:Normal     Neuro/Psych PSYCHIATRIC DISORDERS Depression    GI/Hepatic GERD  Medicated and Controlled,(+)     substance abuse  alcohol use,   Endo/Other  Morbid obesity  Renal/GU      Musculoskeletal   Abdominal   Peds  Hematology   Anesthesia Other Findings   Reproductive/Obstetrics                             Anesthesia Physical Anesthesia Plan  ASA: III  Anesthesia Plan: General   Post-op Pain Management:    Induction: Intravenous  Airway Management Planned: LMA  Additional Equipment:   Intra-op Plan:   Post-operative Plan: Extubation in OR  Informed Consent: I have reviewed the patients History and Physical, chart, labs and discussed the procedure including the risks, benefits and alternatives for the proposed anesthesia with the patient or authorized representative who has indicated his/her understanding and acceptance.     Plan Discussed with:   Anesthesia Plan Comments:         Anesthesia Quick Evaluation

## 2016-08-24 NOTE — Anesthesia Postprocedure Evaluation (Signed)
Anesthesia Post Note  Patient: Ryan Hickman  Procedure(s) Performed: Procedure(s) (LRB): KNEE ARTHROSCOPY WITH MEDIAL MENISECTOMY (Left)  Patient location during evaluation: Short Stay Anesthesia Type: General Level of consciousness: awake and alert and oriented Pain management: pain level controlled Vital Signs Assessment: post-procedure vital signs reviewed and stable Respiratory status: spontaneous breathing Cardiovascular status: stable Postop Assessment: no signs of nausea or vomiting Anesthetic complications: no     Last Vitals:  Vitals:   08/24/16 1225 08/24/16 1228  BP:  (!) 144/89  Pulse: 66 68  Resp: 15 (!) 9  Temp:      Last Pain:  Vitals:   08/24/16 1228  TempSrc:   PainSc: 3                  [name]

## 2016-08-24 NOTE — Transfer of Care (Signed)
Immediate Anesthesia Transfer of Care Note  Patient: Ryan Hickman  Procedure(s) Performed: Procedure(s): KNEE ARTHROSCOPY WITH MEDIAL MENISECTOMY (Left)  Patient Location: PACU  Anesthesia Type:General  Level of Consciousness: awake, alert  and oriented  Airway & Oxygen Therapy: Patient Spontanous Breathing and Patient connected to face mask oxygen  Post-op Assessment: Report given to RN  Post vital signs: Reviewed and stable  Last Vitals:  Vitals:   08/24/16 1015 08/24/16 1030  Resp: 19 17  Temp:      Last Pain:  Vitals:   08/24/16 0920  TempSrc: Oral  PainSc: 3       Patients Stated Pain Goal: 5 (19/91/44 [id number])  Complications: No apparent anesthesia complications

## 2016-08-24 NOTE — Progress Notes (Signed)
Wife and patient "have crutches already", declined need for DME walker, "we have one we can borrow if needed".

## 2016-08-24 NOTE — Discharge Instructions (Addendum)
We did the knee arthroscopy the medial meniscus was torn; we took out the torn fragments; the remaining portions of your knee were normal   PATIENT INSTRUCTIONS POST-ANESTHESIA  IMMEDIATELY FOLLOWING SURGERY:  Do not drive or operate machinery for the first twenty four hours after surgery.  Do not make any important decisions for twenty four hours after surgery or while taking narcotic pain medications or sedatives.  If you develop intractable nausea and vomiting or a severe headache please notify your doctor immediately.  FOLLOW-UP:  Please make an appointment with your surgeon as instructed. You do not need to follow up with anesthesia unless specifically instructed to do so.  WOUND CARE INSTRUCTIONS (if applicable):  Keep a dry clean dressing on the anesthesia/puncture wound site if there is drainage.  Once the wound has quit draining you may leave it open to air.  Generally you should leave the bandage intact for twenty four hours unless there is drainage.  If the epidural site drains for more than 36-48 hours please call the anesthesia department.  QUESTIONS?:  Please feel free to call your physician or the hospital operator if you have any questions, and they will be happy to assist you.       Knee Arthroscopy, Care After Refer to this sheet in the next few weeks. These instructions provide you with information about caring for yourself after your procedure. Your health care provider may also give you more specific instructions. Your treatment has been planned according to current medical practices, but problems sometimes occur. Call your health care provider if you have any problems or questions after your procedure. What can I expect after the procedure? After the procedure, it is common to have:  Soreness.  Pain. Follow these instructions at home: Bathing   Do not take baths, swim, or use a hot tub until your health care provider approves. Incision care   There are many  different ways to close and cover an incision, including stitches, skin glue, and adhesive strips. Follow your health care providers instructions about:  Incision care.  Bandage (dressing) changes and removal.  Incision closure removal.  Check your incision area every day for signs of infection. Watch for:  Redness, swelling, or pain.  Fluid, blood, or pus. Activity   Avoid strenuous activities for as long as directed by your health care provider.  Return to your normal activities as directed by your health care provider. Ask your health care provider what activities are safe for you.  Perform range-of-motion exercises only as directed by your health care provider.  Do not lift anything that is heavier than 10 lb (4.5 kg).  Do not drive or operate heavy machinery while taking pain medicine.  If you were given crutches, use them as directed by your health care provider. Managing pain, stiffness, and swelling   If directed, apply ice to the injured area:  Put ice in a plastic bag.  Place a towel between your skin and the bag.  Leave the ice on for 20 minutes, 2-3 times per day.  Raise the injured area above the level of your heart while you are sitting or lying down as directed by your health care provider. General instructions   Keep all follow-up visits as directed by your health care provider. This is important.  Take medicines only as directed by your health care provider.  Do not use any tobacco products, including cigarettes, chewing tobacco, or electronic cigarettes. If you need help quitting, ask your health  care provider.  If you were given compression stockings, wear them as directed by your health care provider. These stockings help prevent blood clots and reduce swelling in your legs. Contact a health care provider if:  You have severe pain with any movement of your knee.  You notice a bad smell coming from the incision or dressing.  You have redness,  swelling, or pain at the site of your incision.  You have fluid, blood, or pus coming from your incision. Get help right away if:  You develop a rash.  You have a fever.  You have difficulty breathing or have shortness of breath.  You develop pain in your calves or in the back of your knee.  You develop chest pain.  You develop numbness or tingling in your leg or foot. This information is not intended to replace advice given to you by your health care provider. Make sure you discuss any questions you have with your health care provider. Document Released: 10/28/2004 Document Revised: 09/10/2015 Document Reviewed: 04/06/2014 Elsevier Interactive Patient Education  2017 [employer].

## 2016-08-25 ENCOUNTER — Encounter: Payer: Self-pay | Admitting: Orthopedic Surgery

## 2016-08-25 ENCOUNTER — Encounter (HOSPITAL_COMMUNITY): Payer: Self-pay | Admitting: Orthopedic Surgery

## 2016-08-28 ENCOUNTER — Encounter: Payer: Self-pay | Admitting: Orthopedic Surgery

## 2016-08-28 ENCOUNTER — Ambulatory Visit (INDEPENDENT_AMBULATORY_CARE_PROVIDER_SITE_OTHER): Payer: 59 | Admitting: Orthopedic Surgery

## 2016-08-28 DIAGNOSIS — Z4889 Encounter for other specified surgical aftercare: Secondary | ICD-10-CM

## 2016-08-28 DIAGNOSIS — Z9889 Other specified postprocedural states: Secondary | ICD-10-CM

## 2016-08-28 MED ORDER — HYDROCODONE-ACETAMINOPHEN 10-325 MG PO TABS: 1.0000 | ORAL_TABLET | Freq: Four times a day (QID) | ORAL | 0 refills | Status: DC | PRN

## 2016-08-28 MED ORDER — IBUPROFEN 800 MG PO TABS: 800.0000 mg | ORAL_TABLET | Freq: Three times a day (TID) | ORAL | 1 refills | Status: DC | PRN

## 2016-08-28 NOTE — Patient Instructions (Signed)
Start home exercises given   Apply ice 4 x a day until swelling stops

## 2016-08-28 NOTE — Progress Notes (Signed)
Postop visit #1 status post knee arthroscopy  Chief Complaint  Patient presents with  . Follow-up    post op SALK, DOS 08/24/16     Operative report  08/24/2016  11:43 AM  PATIENT:  Ryan Hickman  56 y.o. male  PRE-OPERATIVE DIAGNOSIS:  LEFT KNEE MEDIAL MENISCUS TEAR  POST-OPERATIVE DIAGNOSIS:  LEFT KNEE MEDIAL MENISCUS TEAR  PROCEDURE:  Procedure(s): KNEE ARTHROSCOPY WITH MEDIAL MENISECTOMY (Left)  Isolated medial meniscus tear , articular cartilage normal   [address]  SURGEON:  Surgeon(s) and Role:    * Carole Civil, MD - Primary    Problems issues concerned: none    Current Meds  Medication Sig  . amLODipine (NORVASC) 5 MG tablet Take 5 mg by mouth at bedtime.   Marland Kitchen aspirin EC 81 MG tablet Take 81 mg by mouth daily before breakfast.   . atorvastatin (LIPITOR) 80 MG tablet Take 80 mg by mouth daily before breakfast.   . Disulfiram 500 MG TABS Take 500 mg by mouth daily before breakfast.   . enalapril (VASOTEC) 20 MG tablet Take 20 mg by mouth 2 (two) times daily.    Marland Kitchen HYDROcodone-acetaminophen (NORCO) 10-325 MG tablet Take 1 tablet by mouth 3 (three) times daily as needed.  Marland Kitchen LORazepam (ATIVAN) 2 MG tablet Take 3 mg by mouth at bedtime.   . meloxicam (MOBIC) 15 MG tablet Take 15 mg by mouth daily before breakfast.  . Multiple Vitamin (MULTIVITAMIN WITH MINERALS) TABS tablet Take 1 tablet by mouth daily before breakfast. One-A-Day 50+  . omeprazole (PRILOSEC) 20 MG capsule Take 20 mg by mouth daily before breakfast.   . verapamil (VERELAN PM) 360 MG 24 hr capsule Take 360 mg by mouth at bedtime.     The portal sites are clean dry and intact, the sutures were removed.  The calf was supple soft and the Homans sign was negative.  The patient is regaining knee flexion  Physical therapy will be started at home   Status post knee arthroscopy  Meds ordered this encounter  Medications  . ibuprofen (ADVIL,MOTRIN) 800 MG tablet    Sig: Take 1 tablet (800 mg total)  by mouth every 8 (eight) hours as needed.    Dispense:  90 tablet    Refill:  1  . HYDROcodone-acetaminophen (NORCO) 10-325 MG tablet    Sig: Take 1 tablet by mouth every 6 (six) hours as needed.    Dispense:  30 tablet    Refill:  0     Follow-up in  3-4 WEEKS

## 2016-09-11 ENCOUNTER — Telehealth: Payer: Self-pay | Admitting: Orthopedic Surgery

## 2016-09-11 NOTE — Telephone Encounter (Signed)
Patient requests refill on Hydrocodone/Acetaminophen10-325 mgs.   Qty  30       Sig: Take 1 tablet by mouth every 6 (six) hours as needed.

## 2016-09-11 NOTE — Telephone Encounter (Signed)
Patient aware.

## 2016-09-11 NOTE — Telephone Encounter (Signed)
Recommend tylenol or advil

## 2016-09-11 NOTE — Telephone Encounter (Signed)
Routing to Dr Harrison for approval 

## 2016-09-19 ENCOUNTER — Encounter: Payer: Self-pay | Admitting: Orthopedic Surgery

## 2016-09-19 ENCOUNTER — Ambulatory Visit (INDEPENDENT_AMBULATORY_CARE_PROVIDER_SITE_OTHER): Payer: 59 | Admitting: Orthopedic Surgery

## 2016-09-19 DIAGNOSIS — Z9889 Other specified postprocedural states: Secondary | ICD-10-CM

## 2016-09-19 DIAGNOSIS — Z4889 Encounter for other specified surgical aftercare: Secondary | ICD-10-CM

## 2016-09-19 MED ORDER — HYDROCODONE-ACETAMINOPHEN 5-325 MG PO TABS: 1.0000 | ORAL_TABLET | Freq: Three times a day (TID) | ORAL | 0 refills | Status: DC | PRN

## 2016-09-19 NOTE — Patient Instructions (Signed)
Use alternative methods of pain control

## 2016-09-19 NOTE — Progress Notes (Signed)
Postop visit #1 status post knee arthroscopy  Chief Complaint  Patient presents with  . Follow-up    Recheck on left knee, SALK, DOS 08-24-16.    Problems issues concerned: kneeling having trouble   Current Meds  Medication Sig  . amLODipine (NORVASC) 5 MG tablet Take 5 mg by mouth at bedtime.   Marland Kitchen aspirin EC 81 MG tablet Take 81 mg by mouth daily before breakfast.   . atorvastatin (LIPITOR) 80 MG tablet Take 80 mg by mouth daily before breakfast.   . Disulfiram 500 MG TABS Take 500 mg by mouth daily before breakfast.   . enalapril (VASOTEC) 20 MG tablet Take 20 mg by mouth 2 (two) times daily.    Marland Kitchen HYDROcodone-acetaminophen (NORCO) 10-325 MG tablet Take 1 tablet by mouth every 6 (six) hours as needed.  Marland Kitchen ibuprofen (ADVIL,MOTRIN) 800 MG tablet Take 1 tablet (800 mg total) by mouth every 8 (eight) hours as needed.  Marland Kitchen LORazepam (ATIVAN) 2 MG tablet Take 3 mg by mouth at bedtime.   . meloxicam (MOBIC) 15 MG tablet Take 15 mg by mouth daily before breakfast.  . Multiple Vitamin (MULTIVITAMIN WITH MINERALS) TABS tablet Take 1 tablet by mouth daily before breakfast. One-A-Day 50+  . verapamil (VERELAN PM) 360 MG 24 hr capsule Take 360 mg by mouth at bedtime.   Meds ordered this encounter  Medications  . HYDROcodone-acetaminophen (NORCO/VICODIN) 5-325 MG tablet    Sig: Take 1 tablet by mouth every 8 (eight) hours as needed for moderate pain.    Dispense:  30 tablet    Refill:  0     The portal sites are clean dry and intact, the sutures were removed.  The calf was supple soft and the Homans sign was negative.  The patient has regaining knee flexion  Status post knee arthroscopy  Follow-up as needed

## 2016-10-04 ENCOUNTER — Telehealth: Payer: Self-pay | Admitting: Orthopedic Surgery

## 2016-10-04 NOTE — Telephone Encounter (Signed)
Hydrocodone-Acetaminophen 5/325mg  Qty 30 Tablets  ° °Take 1 tablet by mouth every 8 (eight) hours as needed for moderate pain. °

## 2016-10-05 DIAGNOSIS — Z79891 Long term (current) use of opiate analgesic: Secondary | ICD-10-CM | POA: Diagnosis not present

## 2016-10-05 DIAGNOSIS — I10 Essential (primary) hypertension: Secondary | ICD-10-CM | POA: Diagnosis not present

## 2016-10-05 DIAGNOSIS — G894 Chronic pain syndrome: Secondary | ICD-10-CM | POA: Diagnosis not present

## 2016-10-05 NOTE — Telephone Encounter (Signed)
declined

## 2016-10-05 NOTE — Telephone Encounter (Signed)
ROUTING TO DR HARRISON FOR REVIEW 

## 2016-11-16 DIAGNOSIS — I10 Essential (primary) hypertension: Secondary | ICD-10-CM | POA: Diagnosis not present

## 2016-11-16 DIAGNOSIS — R5383 Other fatigue: Secondary | ICD-10-CM | POA: Diagnosis not present

## 2016-12-05 ENCOUNTER — Ambulatory Visit: Payer: 59 | Admitting: Orthopedic Surgery

## 2017-02-07 DIAGNOSIS — R52 Pain, unspecified: Secondary | ICD-10-CM | POA: Diagnosis not present

## 2017-02-07 DIAGNOSIS — J019 Acute sinusitis, unspecified: Secondary | ICD-10-CM | POA: Diagnosis not present

## 2017-02-07 DIAGNOSIS — J029 Acute pharyngitis, unspecified: Secondary | ICD-10-CM | POA: Diagnosis not present

## 2017-02-28 ENCOUNTER — Ambulatory Visit (INDEPENDENT_AMBULATORY_CARE_PROVIDER_SITE_OTHER): Payer: 59

## 2017-02-28 ENCOUNTER — Encounter: Payer: Self-pay | Admitting: Orthopedic Surgery

## 2017-02-28 ENCOUNTER — Ambulatory Visit (INDEPENDENT_AMBULATORY_CARE_PROVIDER_SITE_OTHER): Payer: 59 | Admitting: Orthopedic Surgery

## 2017-02-28 VITALS — BP 148/82 | HR 93 | Ht 70.0 in | Wt 244.0 lb

## 2017-02-28 DIAGNOSIS — G8929 Other chronic pain: Secondary | ICD-10-CM

## 2017-02-28 DIAGNOSIS — S46112A Strain of muscle, fascia and tendon of long head of biceps, left arm, initial encounter: Secondary | ICD-10-CM | POA: Diagnosis not present

## 2017-02-28 DIAGNOSIS — M25512 Pain in left shoulder: Secondary | ICD-10-CM | POA: Diagnosis not present

## 2017-02-28 DIAGNOSIS — Z23 Encounter for immunization: Secondary | ICD-10-CM | POA: Diagnosis not present

## 2017-02-28 NOTE — Progress Notes (Signed)
Progress Note   Patient ID: Ryan Hickman, male   DOB: 1960-09-07, 56 y.o.   MRN: [id number]  Chief Complaint  Patient presents with  . 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  He complains of excruciating left shoulder pain for several years.  He decided to get it checked out when the pain became constant.  He says there is no comfortable position he can get it at night.  It is not associated with any numbness or neck pain but the pain is constant its in the front of his arm.  He did not get better with injection he did get some relief with meloxicam for the first 4 weeks he was taking it but that has no longer provided him with pain relief.  There is no trauma.      Review of Systems  Constitutional: Negative for chills and fever.  Skin: Negative for itching and rash.  Neurological: Negative for tingling, sensory change and focal weakness.   Current Meds  Medication Sig  . amLODipine (NORVASC) 5 MG tablet Take 5 mg by mouth at bedtime.   Marland Kitchen aspirin EC 81 MG tablet Take 81 mg by mouth daily before breakfast.   . atorvastatin (LIPITOR) 80 MG tablet Take 80 mg by mouth daily before breakfast.   . buPROPion (WELLBUTRIN XL) 300 MG 24 hr tablet Take 300 mg daily by mouth.  . Disulfiram 500 MG TABS Take 500 mg by mouth daily before breakfast.   . LORazepam (ATIVAN) 2 MG tablet Take 3 mg by mouth at bedtime.   . meloxicam (MOBIC) 15 MG tablet Take 15 mg by mouth daily before breakfast.  . Multiple Vitamin (MULTIVITAMIN WITH MINERALS) TABS tablet Take 1 tablet by mouth daily before breakfast. One-A-Day 50+  . omeprazole (PRILOSEC) 20 MG capsule Take 20 mg by mouth daily before breakfast.     Past Medical History:  Diagnosis Date  . Acid reflux   . Arthritis   . Childhood asthma   . Depression   . History of ETOH abuse   . Hyperlipidemia   . Hypertension   . Insomnia   . Sleep apnea    uses  CPAP, "off and on".  . Urticaria      Allergies  Allergen Reactions  . Daypro [Oxaprozin] Anaphylaxis  . Hysingla Er [Hydrocodone Bitartrate Er] Hives  . Levofloxacin Hives    BP (!) 148/82   Pulse 93   Ht 5\' 10"  (1.778 m)   Wt 244 lb (110.7 kg)   BMI 35.01 kg/m    Physical Exam  Constitutional: He is oriented to person, place, and time. He appears well-developed and well-nourished. No distress.  Musculoskeletal:       Right shoulder: He exhibits normal range of motion and no tenderness.       Arms: Gait pattern is normal  He has full range of motion and no tenderness in the cervical spine he does have a cyst in the skin in the subcutaneous area on the back of the neck.  Neurological: He is alert and oriented to person, place, and time.  Skin: Skin is warm and dry. Capillary refill takes less than 2 seconds. No rash noted. He is not diaphoretic. No erythema. No pallor.    Ortho Exam Neer impingement sign was positive and the Speed test was positive for biceps tendon pathology  Medical decision-making  Imaging:  Plain films of the shoulder please see dictated report there is no arthritis he has a type III acromion without spur he has greater tuberosity sclerosis of the bone  Encounter Diagnoses  Name Primary?  . Chronic left shoulder pain Yes  . Labral tear of long head of left biceps tendon, initial encounter       Meds ordered this encounter  Medications  . buPROPion (WELLBUTRIN XL) 300 MG 24 hr tablet    Sig: Take 300 mg daily by mouth.   Recommend contrast MRI left shoulder  Arther Abbott, MD 02/28/2017 3:58 PM

## 2017-03-02 ENCOUNTER — Other Ambulatory Visit: Payer: Self-pay | Admitting: Orthopedic Surgery

## 2017-03-02 ENCOUNTER — Telehealth: Payer: Self-pay | Admitting: Radiology

## 2017-03-02 DIAGNOSIS — Z8679 Personal history of other diseases of the circulatory system: Secondary | ICD-10-CM

## 2017-03-02 NOTE — Progress Notes (Signed)
Placed order for ISTAT creat. They need this prior to MR / Arthrogram since he has hypertension.  Called patient study is sch for [facility] Nov 16th at 8:45

## 2017-03-02 NOTE — Telephone Encounter (Signed)
I have made patient appointment for his MRI Arthrogram for Friday 16th, he is asking to come in on the following Wed to review, but there are not any openings, can you call him if anyone cancels ?

## 2017-03-05 NOTE — Telephone Encounter (Signed)
That is fine, he can call to schedule with central scheduling. [phone], I called him to provide the number for him to call he is scheduled for next week on Tuesday.

## 2017-03-05 NOTE — Telephone Encounter (Signed)
Spoke with patient - he states he will need to re-schedule his MRI to a date after Thanksgiving. He relayed the time got changed by Centralized scheduling, and due to work, 11/16 date is not good for him after all.  We discussed the time frame that his MRI w/Arthrogram is approved for. He will call back to let us know new appointment, so that we may schedule his follow-up.

## 2017-03-09 ENCOUNTER — Ambulatory Visit (HOSPITAL_COMMUNITY): Payer: 59

## 2017-03-13 ENCOUNTER — Other Ambulatory Visit: Payer: Self-pay | Admitting: Orthopedic Surgery

## 2017-03-13 ENCOUNTER — Ambulatory Visit (HOSPITAL_COMMUNITY)
Admission: RE | Admit: 2017-03-13 | Discharge: 2017-03-13 | Disposition: A | Discharge status: Home / Self Care | Payer: 59 | Source: Ambulatory Visit | Attending: Orthopedic Surgery | Admitting: Orthopedic Surgery

## 2017-03-13 DIAGNOSIS — G8929 Other chronic pain: Secondary | ICD-10-CM | POA: Insufficient documentation

## 2017-03-13 DIAGNOSIS — M19012 Primary osteoarthritis, left shoulder: Secondary | ICD-10-CM | POA: Insufficient documentation

## 2017-03-13 DIAGNOSIS — X58XXXA Exposure to other specified factors, initial encounter: Secondary | ICD-10-CM | POA: Diagnosis not present

## 2017-03-13 DIAGNOSIS — M25512 Pain in left shoulder: Principal | ICD-10-CM

## 2017-03-13 DIAGNOSIS — S46112A Strain of muscle, fascia and tendon of long head of biceps, left arm, initial encounter: Secondary | ICD-10-CM

## 2017-03-13 DIAGNOSIS — Z135 Encounter for screening for eye and ear disorders: Secondary | ICD-10-CM | POA: Diagnosis not present

## 2017-03-13 DIAGNOSIS — M75122 Complete rotator cuff tear or rupture of left shoulder, not specified as traumatic: Secondary | ICD-10-CM | POA: Diagnosis not present

## 2017-03-13 MED ORDER — LIDOCAINE HCL (PF) 1 % IJ SOLN
INTRAMUSCULAR | Status: AC
  Administered 2017-03-13: 5 mL
  Filled 2017-03-13: qty 5

## 2017-03-13 MED ORDER — GADOBENATE DIMEGLUMINE 529 MG/ML IV SOLN
5.0000 mL | Freq: Once | INTRAVENOUS | Status: AC | PRN
  Administered 2017-03-13: 0.01 mL via INTRA_ARTICULAR

## 2017-03-13 MED ORDER — SODIUM CHLORIDE 0.9 % IJ SOLN
INTRAMUSCULAR | Status: AC
  Administered 2017-03-13: 7 mL
  Filled 2017-03-13: qty 10

## 2017-03-13 MED ORDER — IOPAMIDOL (ISOVUE-300) INJECTION 61%
INTRAVENOUS | Status: AC
  Administered 2017-03-13: 15 mL
  Filled 2017-03-13: qty 30

## 2017-03-21 ENCOUNTER — Ambulatory Visit (INDEPENDENT_AMBULATORY_CARE_PROVIDER_SITE_OTHER): Payer: 59 | Admitting: Orthopedic Surgery

## 2017-03-21 ENCOUNTER — Encounter: Payer: Self-pay | Admitting: Orthopedic Surgery

## 2017-03-21 VITALS — BP 145/89 | HR 87 | Ht 70.0 in | Wt 251.0 lb

## 2017-03-21 DIAGNOSIS — M25512 Pain in left shoulder: Secondary | ICD-10-CM

## 2017-03-21 DIAGNOSIS — G8929 Other chronic pain: Secondary | ICD-10-CM

## 2017-03-21 DIAGNOSIS — M75122 Complete rotator cuff tear or rupture of left shoulder, not specified as traumatic: Secondary | ICD-10-CM

## 2017-03-21 MED ORDER — TRAMADOL-ACETAMINOPHEN 37.5-325 MG PO TABS: 1.0000 | ORAL_TABLET | Freq: Four times a day (QID) | ORAL | 5 refills | Status: DC | PRN

## 2017-03-21 NOTE — Patient Instructions (Addendum)
Steps to Quit Smoking Smoking tobacco can be bad for your health. It can also affect almost every organ in your body. Smoking puts you and people around you at risk for many serious Florita Nitsch-lasting (chronic) diseases. Quitting smoking is hard, but it is one of the best things that you can do for your health. It is never too late to quit. What are the benefits of quitting smoking? When you quit smoking, you lower your risk for getting serious diseases and conditions. They can include:  Lung cancer or lung disease.  Heart disease.  Stroke.  Heart attack.  Not being able to have children (infertility).  Weak bones (osteoporosis) and broken bones (fractures).  If you have coughing, wheezing, and shortness of breath, those symptoms may get better when you quit. You may also get sick less often. If you are pregnant, quitting smoking can help to lower your chances of having a baby of low birth weight. What can I do to help me quit smoking? Talk with your doctor about what can help you quit smoking. Some things you can do (strategies) include:  Quitting smoking totally, instead of slowly cutting back how much you smoke over a period of time.  Going to in-person counseling. You are more likely to quit if you go to many counseling sessions.  Using resources and support systems, such as: ? Online chats with a counselor. ? Phone quitlines. ? Printed self-help materials. ? Support groups or group counseling. ? Text messaging programs. ? Mobile phone apps or applications.  Taking medicines. Some of these medicines may have nicotine in them. If you are pregnant or breastfeeding, do not take any medicines to quit smoking unless your doctor says it is okay. Talk with your doctor about counseling or other things that can help you.  Talk with your doctor about using more than one strategy at the same time, such as taking medicines while you are also going to in-person counseling. This can help make  quitting easier. What things can I do to make it easier to quit? Quitting smoking might feel very hard at first, but there is a lot that you can do to make it easier. Take these steps:  Talk to your family and friends. Ask them to support and encourage you.  Call phone quitlines, reach out to support groups, or work with a counselor.  Ask people who smoke to not smoke around you.  Avoid places that make you want (trigger) to smoke, such as: ? Bars. ? Parties. ? Smoke-break areas at work.  Spend time with people who do not smoke.  Lower the stress in your life. Stress can make you want to smoke. Try these things to help your stress: ? Getting regular exercise. ? Deep-breathing exercises. ? Yoga. ? Meditating. ? Doing a body scan. To do this, close your eyes, focus on one area of your body at a time from head to toe, and notice which parts of your body are tense. Try to relax the muscles in those areas.  Download or buy apps on your mobile phone or tablet that can help you stick to your quit plan. There are many free apps, such as QuitGuide from the CDC (Centers for Disease Control and Prevention). You can find more support from smokefree.gov and other websites.  This information is not intended to replace advice given to you by your health care provider. Make sure you discuss any questions you have with your health care provider. Document Released: 02/04/2009 Document   Revised: 12/07/2015 Document Reviewed: 08/25/2014 Elsevier Interactive Patient Education  2018 [place] have decided to proceed with rotator cuff repair surgery. You have decided not to continue with nonoperative measures such as but not limited to oral medication,   activity modification, physical therapy, or injection.  We will perform a rotator cuff repair. Some of the risks associated with rotator cuff repair include but are not limited to Bleeding Infection Swelling Stiffness Blood  clot Pain Re-tearing of the rotator cuff Failure of the rotator cuff to heal   If you're not comfortable with these risks and would like to continue with nonoperative treatment please let Dr. Aline Brochure know prior to your surgery.   Rotator Cuff Injury Rotator cuff injury is any type of injury to the set of muscles and tendons that make up the stabilizing unit of your shoulder. This unit holds the ball of your upper arm bone (humerus) in the socket of your shoulder blade (scapula). What are the causes? Injuries to your rotator cuff most commonly come from sports or activities that cause your arm to be moved repeatedly over your head. Examples of this include throwing, weight lifting, swimming, or racquet sports. Merrel Crabbe lasting (chronic) irritation of your rotator cuff can cause soreness and swelling (inflammation), bursitis, and eventual damage to your tendons, such as a tear (rupture). What are the signs or symptoms? Acute rotator cuff tear:  Sudden tearing sensation followed by severe pain shooting from your upper shoulder down your arm toward your elbow.  Decreased range of motion of your shoulder because of pain and muscle spasm.  Severe pain.  Inability to raise your arm out to the side because of pain and loss of muscle power (large tears).  Chronic rotator cuff tear:  Pain that usually is worse at night and may interfere with sleep.  Gradual weakness and decreased shoulder motion as the pain worsens.  Decreased range of motion.  Rotator cuff tendinitis:  Deep ache in your shoulder and the outside upper arm over your shoulder.  Pain that comes on gradually and becomes worse when lifting your arm to the side or turning it inward.  How is this diagnosed? Rotator cuff injury is diagnosed through a medical history, physical exam, and imaging exam. The medical history helps determine the type of rotator cuff injury. Your health care provider will look at your injured shoulder,  feel the injured area, and ask you to move your shoulder in different positions. X-ray exams typically are done to rule out other causes of shoulder pain, such as fractures. MRI is the exam of choice for the most severe shoulder injuries because the images show muscles and tendons. How is this treated? Chronic tear:  Medicine for pain, such as acetaminophen or ibuprofen.  Physical therapy and range-of-motion exercises may be helpful in maintaining shoulder function and strength.  Steroid injections into your shoulder joint.  Surgical repair of the rotator cuff if the injury does not heal with noninvasive treatment.  Acute tear:  Anti-inflammatory medicines such as ibuprofen and naproxen to help reduce pain and swelling.  A sling to help support your arm and rest your rotator cuff muscles. Emet Rafanan-term use of a sling is not advised. It may cause significant stiffening of the shoulder joint.  Surgery may be considered within a few weeks, especially in younger, active people, to return the shoulder to full function.  Indications for surgical treatment include the following: ? Age younger than 101 years. ? Rotator cuff tears that  are complete. ? Physical therapy, rest, and anti-inflammatory medicines have been used for 6-8 weeks, with no improvement. ? Employment or sporting activity that requires constant shoulder use.  Tendinitis:  Anti-inflammatory medicines such as ibuprofen and naproxen to help reduce pain and swelling.  A sling to help support your arm and rest your rotator cuff muscles. Kimiye Strathman-term use of a sling is not advised. It may cause significant stiffening of the shoulder joint.  Severe tendinitis may require: ? Steroid injections into your shoulder joint. ? Physical therapy. ? Surgery.  Follow these instructions at home:  Apply ice to your injury: ? Put ice in a plastic bag. ? Place a towel between your skin and the bag. ? Leave the ice on for 20 minutes, 2-3 times a  day.  If you have a shoulder immobilizer (sling and straps), wear it until told otherwise by your health care provider.  You may want to sleep on several pillows or in a recliner at night to lessen swelling and pain.  Only take over-the-counter or prescription medicines for pain, discomfort, or fever as directed by your health care provider.  Do simple hand squeezing exercises with a soft rubber ball to decrease hand swelling. Contact a health care provider if:  Your shoulder pain increases, or new pain or numbness develops in your arm, hand, or fingers.  Your hand or fingers are colder than your other hand. Get help right away if:  Your arm, hand, or fingers are numb or tingling.  Your arm, hand, or fingers are increasingly swollen and painful, or they turn white or blue. This information is not intended to replace advice given to you by your health care provider. Make sure you discuss any questions you have with your health care provider. Document Released: 04/07/2000 Document Revised: 09/16/2015 Document Reviewed: 11/20/2012 Elsevier Interactive Patient Education  2018 [employer].

## 2017-03-21 NOTE — Progress Notes (Signed)
Progress Note   Patient ID: Ryan Hickman, male   DOB: [birth year], 56 y.o.   MRN: [id number]  Chief Complaint  Patient presents with  . 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  Pain became constant exam and trouble sleeping at night.  He did not get better after the injection and the meloxicam.  We sent him for an MRI his MRI he still has same symptoms  Review of Systems  Cardiovascular: Negative for chest pain.  Musculoskeletal: Negative for neck pain.  Neurological: Negative for tingling.   Current Meds  Medication Sig  . amLODipine (NORVASC) 5 MG tablet Take 5 mg by mouth at bedtime.   Marland Kitchen aspirin EC 81 MG tablet Take 81 mg by mouth daily before breakfast.   . atorvastatin (LIPITOR) 80 MG tablet Take 80 mg by mouth daily before breakfast.   . buPROPion (WELLBUTRIN XL) 300 MG 24 hr tablet Take 300 mg daily by mouth.  . Disulfiram 500 MG TABS Take 500 mg by mouth daily before breakfast.   . ibuprofen (ADVIL,MOTRIN) 800 MG tablet Take 1 tablet (800 mg total) by mouth every 8 (eight) hours as needed.  Marland Kitchen LORazepam (ATIVAN) 2 MG tablet Take 3 mg by mouth at bedtime.   . meloxicam (MOBIC) 15 MG tablet Take 15 mg by mouth daily before breakfast.  . Multiple Vitamin (MULTIVITAMIN WITH MINERALS) TABS tablet Take 1 tablet by mouth daily before breakfast. One-A-Day 50+  . omeprazole (PRILOSEC) 20 MG capsule Take 20 mg by mouth daily before breakfast.     Allergies  Allergen Reactions  . Daypro [Oxaprozin] Anaphylaxis  . Hysingla Er [Hydrocodone Bitartrate Er] Hives  . Levofloxacin Hives     BP (!) 145/89   Pulse 87   Ht 5\' 10"  (1.778 m)   Wt 251 lb (113.9 kg)   BMI 36.01 kg/m   Physical Exam   Medical decision-making Review of MRI report  PRESSION: Motion degraded examination demonstrating a full-thickness tear of the leading edge of the supraspinatus measuring approximately 1.2 cm from front to back.  Retraction is mild at 1 cm or less. No atrophy.   Intact long head of biceps and glenoid labrum.   Moderate acromioclavicular osteoarthritis.     Electronically Signed   By: Inge Rise M.D.   On: 03/13/2017 14:26   Personal interpretation of MRI report  After looking at the images I interpret this as a rotator cuff tear small non-retracted no muscle atrophy involving supraspinatus tendon  Encounter Diagnoses  Name Primary?  . Chronic left shoulder pain Yes  . Complete tear of left rotator cuff       Meds ordered this encounter  Medications  . traMADol-acetaminophen (ULTRACET) 37.5-325 MG tablet    Sig: Take 1 tablet by mouth every 6 (six) hours as needed.    Dispense:  56 tablet    Refill:  5    [place] will need a cuff repair is options for nonoperative treatment place him at risk for propagation of the tear as shown in recent studies  Discussed with him and he would like to proceed with the cuff repair understanding that he may be out of work for 1 year   The procedure has been fully reviewed with the patient; The risks and benefits of surgery have been discussed and explained and understood. Alternative treatment has also been reviewed, questions were encouraged and answered. The postoperative plan is also  been reviewed.   Arther Abbott, MD 03/21/2017 4:22 PM

## 2017-03-22 ENCOUNTER — Telehealth: Payer: Self-pay | Admitting: Radiology

## 2017-03-22 NOTE — Telephone Encounter (Signed)
Ultracet is only covered by his plan for quantity of 40 per month.   I will call patient to advise.

## 2017-03-23 NOTE — Telephone Encounter (Signed)
Patient called to check on status of this, states [facility], Linna Hoff is awaiting further advice. traMADol-acetaminophen (ULTRACET) 37.5-325 MG tablet 56 tablet 5 03/21/2017

## 2017-03-23 NOTE — Telephone Encounter (Signed)
I have called patient. He states he will have pharmacy fill 40 with his insurance, and pay for the remaining 16 out of pocket

## 2017-03-29 DIAGNOSIS — E782 Mixed hyperlipidemia: Secondary | ICD-10-CM | POA: Diagnosis not present

## 2017-03-29 DIAGNOSIS — G4709 Other insomnia: Secondary | ICD-10-CM | POA: Diagnosis not present

## 2017-05-17 DIAGNOSIS — M75112 Incomplete rotator cuff tear or rupture of left shoulder, not specified as traumatic: Secondary | ICD-10-CM | POA: Diagnosis not present

## 2017-05-17 DIAGNOSIS — E6609 Other obesity due to excess calories: Secondary | ICD-10-CM | POA: Diagnosis not present

## 2017-05-17 DIAGNOSIS — M25511 Pain in right shoulder: Secondary | ICD-10-CM | POA: Diagnosis not present

## 2017-06-20 DIAGNOSIS — M25511 Pain in right shoulder: Secondary | ICD-10-CM | POA: Diagnosis not present

## 2017-06-20 DIAGNOSIS — Z1389 Encounter for screening for other disorder: Secondary | ICD-10-CM | POA: Diagnosis not present

## 2017-06-20 DIAGNOSIS — J0101 Acute recurrent maxillary sinusitis: Secondary | ICD-10-CM | POA: Diagnosis not present

## 2017-06-27 ENCOUNTER — Other Ambulatory Visit: Payer: Self-pay

## 2017-06-27 ENCOUNTER — Encounter (HOSPITAL_COMMUNITY): Payer: Self-pay | Admitting: Emergency Medicine

## 2017-06-27 ENCOUNTER — Emergency Department (HOSPITAL_COMMUNITY)
Admission: EM | Admit: 2017-06-27 | Discharge: 2017-06-27 | Disposition: A | Discharge status: Home / Self Care | Payer: 59 | Attending: Emergency Medicine | Admitting: Emergency Medicine

## 2017-06-27 DIAGNOSIS — J45909 Unspecified asthma, uncomplicated: Secondary | ICD-10-CM | POA: Diagnosis not present

## 2017-06-27 DIAGNOSIS — E860 Dehydration: Secondary | ICD-10-CM | POA: Insufficient documentation

## 2017-06-27 DIAGNOSIS — F329 Major depressive disorder, single episode, unspecified: Secondary | ICD-10-CM | POA: Insufficient documentation

## 2017-06-27 DIAGNOSIS — R197 Diarrhea, unspecified: Secondary | ICD-10-CM | POA: Diagnosis not present

## 2017-06-27 DIAGNOSIS — I10 Essential (primary) hypertension: Secondary | ICD-10-CM | POA: Insufficient documentation

## 2017-06-27 DIAGNOSIS — Z79899 Other long term (current) drug therapy: Secondary | ICD-10-CM | POA: Insufficient documentation

## 2017-06-27 DIAGNOSIS — F17210 Nicotine dependence, cigarettes, uncomplicated: Secondary | ICD-10-CM | POA: Insufficient documentation

## 2017-06-27 DIAGNOSIS — Z7982 Long term (current) use of aspirin: Secondary | ICD-10-CM | POA: Insufficient documentation

## 2017-06-27 DIAGNOSIS — F419 Anxiety disorder, unspecified: Secondary | ICD-10-CM | POA: Diagnosis not present

## 2017-06-27 DIAGNOSIS — R740 Nonspecific elevation of levels of transaminase and lactic acid dehydrogenase [LDH]: Secondary | ICD-10-CM | POA: Diagnosis not present

## 2017-06-27 DIAGNOSIS — R7401 Elevation of levels of liver transaminase levels: Secondary | ICD-10-CM

## 2017-06-27 LAB — CBC
HCT: 48.9 % (ref 39.0–52.0)
Hemoglobin: 16.6 g/dL (ref 13.0–17.0)
MCH: 30.7 pg (ref 26.0–34.0)
MCHC: 33.9 g/dL (ref 30.0–36.0)
MCV: 90.6 fL (ref 78.0–100.0)
PLATELETS: 187 10*3/uL (ref 150–400)
RBC: 5.4 MIL/uL (ref 4.22–5.81)
RDW: 13.1 % (ref 11.5–15.5)
WBC: 9.1 10*3/uL (ref 4.0–10.5)

## 2017-06-27 LAB — COMPREHENSIVE METABOLIC PANEL
ALK PHOS: 98 U/L (ref 38–126)
ALT: 111 U/L — AB (ref 17–63)
AST: 73 U/L — ABNORMAL HIGH (ref 15–41)
Albumin: 4.4 g/dL (ref 3.5–5.0)
Anion gap: 12 (ref 5–15)
BUN: 28 mg/dL — ABNORMAL HIGH (ref 6–20)
CHLORIDE: 110 mmol/L (ref 101–111)
CO2: 17 mmol/L — ABNORMAL LOW (ref 22–32)
CREATININE: 1.29 mg/dL — AB (ref 0.61–1.24)
Calcium: 8.9 mg/dL (ref 8.9–10.3)
GFR calc Af Amer: >60 mL/min (ref >60)
Glucose, Bld: 108 mg/dL — ABNORMAL HIGH (ref 65–99)
Potassium: 3.7 mmol/L (ref 3.5–5.1)
Sodium: 139 mmol/L (ref 135–145)
Total Bilirubin: 0.8 mg/dL (ref 0.3–1.2)
Total Protein: 7.5 g/dL (ref 6.5–8.1)

## 2017-06-27 LAB — URINALYSIS, ROUTINE W REFLEX MICROSCOPIC
Bilirubin Urine: NEGATIVE
Glucose, UA: NEGATIVE mg/dL
Hgb urine dipstick: NEGATIVE
Ketones, ur: NEGATIVE mg/dL
LEUKOCYTES UA: NEGATIVE
Nitrite: NEGATIVE
Protein, ur: 100 mg/dL — AB
SPECIFIC GRAVITY, URINE: 1.028 (ref 1.005–1.030)
pH: 5 (ref 5.0–8.0)

## 2017-06-27 LAB — C DIFFICILE QUICK SCREEN W PCR REFLEX
C DIFFICLE (CDIFF) ANTIGEN: NEGATIVE
C Diff interpretation: NOT DETECTED
C Diff toxin: NEGATIVE

## 2017-06-27 LAB — LIPASE, BLOOD: LIPASE: 23 U/L (ref 11–51)

## 2017-06-27 MED ORDER — SODIUM CHLORIDE 0.9 % IV BOLUS (SEPSIS)
1000.0000 mL | Freq: Once | INTRAVENOUS | Status: AC
  Administered 2017-06-27: 1000 mL via INTRAVENOUS

## 2017-06-27 MED ORDER — DIPHENOXYLATE-ATROPINE 2.5-0.025 MG PO TABS: 1.0000 | ORAL_TABLET | Freq: Four times a day (QID) | ORAL | 0 refills | Status: DC | PRN

## 2017-06-27 MED ORDER — DIPHENOXYLATE-ATROPINE 2.5-0.025 MG PO TABS
2.0000 | ORAL_TABLET | Freq: Once | ORAL | Status: AC
  Administered 2017-06-27: 2 via ORAL
  Filled 2017-06-27: qty 2

## 2017-06-27 MED ORDER — ONDANSETRON HCL 4 MG/2ML IJ SOLN
4.0000 mg | Freq: Once | INTRAMUSCULAR | Status: AC
  Administered 2017-06-27: 4 mg via INTRAVENOUS
  Filled 2017-06-27: qty 2

## 2017-06-27 NOTE — ED Notes (Signed)
Pt given ginger ale.

## 2017-06-27 NOTE — Discharge Instructions (Signed)
Use the lomotil prescribed only if you continue to have diarrhea.  Make sure you are drinking plenty of fluids, your low sugar gatorade is perfect.  I also advise bland, low fat foods until better. Followup with your primary doctor as discussed.  Also of note, your liver enzymes are slightly elevated today and you should have your doctor recheck these labs to make sure they are returning to normal.

## 2017-06-27 NOTE — ED Triage Notes (Signed)
Pt reports nausea and diarrhea since Monday. Endorses abdominal cramping. Recently on Augmentin for sinus infection. No fever.

## 2017-06-27 NOTE — ED Provider Notes (Signed)
[facility] EMERGENCY DEPARTMENT Provider Note   CSN: [id number] Arrival date & time: 06/27/17  4270     History   Chief Complaint Chief Complaint  Patient presents with  . Diarrhea    HPI Ryan Hickman is a 57 y.o. male with past medical history as outlined below (recovered alcoholic) presenting with a 2 day history of nonbloody diarrhea, reporting approximate 15 episodes daily since his symptoms began along with nausea without emesis and denies fevers.  His stool has been watery/yellow, but turned watery/green this am.  He has been unable to consume any fluids or solids without an episode of diarrhea.  He has been drinking low sugar Gatorade and has taken 4 Imodium tablets over the past 24 hours at his PCPs recommendation without improvement in symptoms.  He has had significant antibiotic exposure stating was treated for sinus infection with a 10-day course of amoxicillin with a 1 week break followed by starting Augmentin 5 days ago as a sinus  symptoms returned.  He endorses generalized fatigue, denies fevers or vomiting although intake has been limited secondary to nausea.  He describes intermittent migratory abdominal cramping.   No recent travel, no family members with similar symptoms.  The history is provided by the patient.    Past Medical History:  Diagnosis Date  . Acid reflux   . Arthritis   . Childhood asthma   . Depression   . History of ETOH abuse   . Hyperlipidemia   . Hypertension   . Insomnia   . Sleep apnea    uses CPAP, "off and on".  . Urticaria     Patient Active Problem List   Diagnosis Date Noted  . Osteoarthritis of left knee 06/26/2013  . Derangement of posterior horn of medial meniscus 06/26/2013  . Left knee pain 06/26/2013  . Insomnia 04/20/2011  . OSA (obstructive sleep apnea) 04/20/2011  . OBESITY 12/20/2009  . TRANSAMINASES, SERUM, ELEVATED 12/20/2009  . RECTAL BLEEDING 08/13/2008  . HYPERCHOLESTEROLEMIA 08/12/2008  . DEHYDRATION  08/12/2008  . DEPRESSION/ANXIETY 08/12/2008  . ALCOHOL ABUSE 08/12/2008  . HYPERTENSION 08/12/2008  . [facility] 08/12/2008  . GASTROESOPHAGEAL REFLUX DISEASE, CHRONIC 08/12/2008  . FEVER UNSPECIFIED 08/12/2008  . VOMITING 08/12/2008  . DIARRHEA 08/12/2008    Past Surgical History:  Procedure Laterality Date  . COLONOSCOPY  May 2010   Dr. Oneida Alar: simple adenoma, normal colon. Repeat 2020  . EAR CYST EXCISION N/A 11/14/2013   Procedure: EXCISION SEBACEOUS CYST CHEST WALL;  Surgeon: Jamesetta So, MD;  Location: [facility];  Service: General;  Laterality: N/A;  . KNEE ARTHROSCOPY WITH MEDIAL MENISECTOMY Left 08/24/2016   Procedure: KNEE ARTHROSCOPY WITH MEDIAL MENISECTOMY;  Surgeon: Carole Civil, MD;  Location: [facility];  Service: Orthopedics;  Laterality: Left;  . KNEE SURGERY Right 1992  . UPPER GASTROINTESTINAL ENDOSCOPY  May 2010   Dr. Oneida Alar: normal       Home Medications    Prior to Admission medications   Medication Sig Start Date End Date Taking? Authorizing Provider  amLODipine-olmesartan (AZOR) 10-40 MG tablet Take 1 tablet by mouth daily. 05/20/17  Yes [provider]  amoxicillin-clavulanate (AUGMENTIN) 875-125 MG tablet Take 1 tablet by mouth 2 (two) times daily. for 10 days 06/20/17  Yes [provider]  aspirin EC 81 MG tablet Take 81 mg by mouth daily before breakfast.    Yes [provider]  atorvastatin (LIPITOR) 80 MG tablet Take 80 mg by mouth daily before breakfast.  11/04/15  Yes [provider]  buPROPion (WELLBUTRIN XL) 300 MG 24 hr tablet Take 300 mg daily by mouth. 02/21/17  Yes [provider]  Disulfiram 500 MG TABS Take 500 mg by mouth daily before breakfast.    Yes [provider]  HYDROcodone-acetaminophen (NORCO) 10-325 MG tablet Take 1 tablet by mouth at bedtime. 06/20/17  Yes [provider]  LORazepam (ATIVAN) 2 MG tablet Take 3 mg by mouth at bedtime.    Yes [provider]  meloxicam  (MOBIC) 15 MG tablet Take 15 mg by mouth daily before breakfast. 07/20/16  Yes [provider]  omeprazole (PRILOSEC) 20 MG capsule Take 20 mg by mouth daily before breakfast.    Yes [provider]  diphenoxylate-atropine (LOMOTIL) 2.5-0.025 MG tablet Take 1 tablet by mouth 4 (four) times daily as needed for diarrhea or loose stools. 06/27/17   Evalee Jefferson, PA-C  ibuprofen (ADVIL,MOTRIN) 800 MG tablet Take 1 tablet (800 mg total) by mouth every 8 (eight) hours as needed. Patient not taking: Reported on 06/27/2017 08/28/16   Carole Civil, MD  traMADol-acetaminophen (ULTRACET) 37.5-325 MG tablet Take 1 tablet by mouth every 6 (six) hours as needed. Patient not taking: Reported on 06/27/2017 03/21/17   Carole Civil, MD    Family History Family History  Problem Relation Age of Onset  . Heart failure Other   . Colon cancer Neg Hx   . Allergic rhinitis Neg Hx   . Asthma Neg Hx   . Eczema Neg Hx   . Urticaria Neg Hx     Social History Social History   Tobacco Use  . Smoking status: Current Every Day Smoker    Packs/day: 1.00    Years: 25.00    Pack years: 25.00    Types: Cigarettes  . Smokeless tobacco: Never Used  Substance Use Topics  . Alcohol use: No    Comment: former  . Drug use: No     Allergies   Daypro [oxaprozin]; Hysingla er [hydrocodone bitartrate er]; and Levofloxacin   Review of Systems Review of Systems  Constitutional: Negative for fever.  HENT: Negative for congestion and sore throat.   Eyes: Negative.   Respiratory: Negative for chest tightness and shortness of breath.   Cardiovascular: Negative for chest pain.  Gastrointestinal: Positive for diarrhea and nausea. Negative for abdominal pain and vomiting.  Genitourinary: Negative.   Musculoskeletal: Negative for arthralgias, joint swelling and neck pain.  Skin: Negative.  Negative for rash and wound.  Neurological: Positive for weakness. Negative for dizziness, light-headedness,  numbness and headaches.  Psychiatric/Behavioral: Negative.      Physical Exam Updated Vital Signs BP (!) 117/51   Pulse 71   Temp 97.9 F (36.6 C) (Oral)   Resp 20   Ht 5\' 10"  (1.778 m)   Wt 115.7 kg (255 lb)   SpO2 98%   BMI 36.59 kg/m   Physical Exam  Constitutional: He appears well-developed and well-nourished.  HENT:  Head: Normocephalic and atraumatic.  Eyes: Conjunctivae are normal.  Neck: Normal range of motion.  Cardiovascular: Normal rate, regular rhythm, normal heart sounds and intact distal pulses.  Pulmonary/Chest: Effort normal and breath sounds normal. He has no wheezes.  Abdominal: Soft. He exhibits no mass. Bowel sounds are increased. There is no hepatosplenomegaly. There is no tenderness. There is no rigidity, no rebound and no guarding.  No increased tympany to percussion.  Musculoskeletal: Normal range of motion.  Neurological: He is alert.  Skin: Skin is warm  and dry.  Psychiatric: He has a normal mood and affect.  Nursing note and vitals reviewed.    ED Treatments / Results  Labs (all labs ordered are listed, but only abnormal results are displayed) Labs Reviewed  COMPREHENSIVE METABOLIC PANEL - Abnormal; Notable for the following components:      Result Value   CO2 17 (*)    Glucose, Bld 108 (*)    BUN 28 (*)    Creatinine, Ser 1.29 (*)    AST 73 (*)    ALT 111 (*)    All other components within normal limits  URINALYSIS, ROUTINE W REFLEX MICROSCOPIC - Abnormal; Notable for the following components:   Color, Urine AMBER (*)    APPearance HAZY (*)    Protein, ur 100 (*)    Bacteria, UA RARE (*)    Squamous Epithelial / LPF 0-5 (*)    All other components within normal limits  C DIFFICILE QUICK SCREEN W PCR REFLEX  GASTROINTESTINAL PANEL BY PCR, STOOL (REPLACES STOOL CULTURE)  LIPASE, BLOOD  CBC    EKG  EKG Interpretation None       Radiology No results found.  Procedures Procedures (including critical care  time)  Medications Ordered in ED Medications  ondansetron (ZOFRAN) injection 4 mg (4 mg Intravenous Given 06/27/17 0935)  sodium chloride 0.9 % bolus 1,000 mL (0 mLs Intravenous Stopped 06/27/17 1154)  sodium chloride 0.9 % bolus 1,000 mL (0 mLs Intravenous Stopped 06/27/17 1308)  diphenoxylate-atropine (LOMOTIL) 2.5-0.025 MG per tablet 2 tablet (2 tablets Oral Given 06/27/17 1307)     Initial Impression / Assessment and Plan / ED Course  I have reviewed the triage vital signs and the nursing notes.  Pertinent labs & imaging results that were available during my care of the patient were reviewed by me and considered in my medical decision making (see chart for details).     Labs reviewed and discussed with pt.  Nausea gone, IV fluids given, PO challenge given.  Will treat with lomotil given c dif negative. He had 4 diarrheal stools while here, none after lomotil given.  Discussed holding augmentin and discussing further tx of his sinusitis if his sinus sx return (currently sx free, has completed 6.5 days of abx).  Suspect his diarrhea is SE of the augmentin, but not c diff per labs obtained.  He felt better at time of dc, discussed home diet until sx completely better. F/u here for any worsened sx.  Also discussed elevated lft's and need for recheck. He has had elevation in the past and carries this diagnosis, denies current etoh use.  Final Clinical Impressions(s) / ED Diagnoses   Final diagnoses:  Diarrhea, unspecified type  Dehydration  Elevated transaminase level    ED Discharge Orders        Ordered    diphenoxylate-atropine (LOMOTIL) 2.5-0.025 MG tablet  4 times daily PRN     06/27/17 1430       Evalee Jefferson, PA-C 06/27/17 [place], [place], DO 06/28/17 2233

## 2017-06-28 LAB — GASTROINTESTINAL PANEL BY PCR, STOOL (REPLACES STOOL CULTURE)
Adenovirus F40/41: NOT DETECTED
Astrovirus: NOT DETECTED
CRYPTOSPORIDIUM: NOT DETECTED
CYCLOSPORA CAYETANENSIS: NOT DETECTED
Campylobacter species: NOT DETECTED
Entamoeba histolytica: NOT DETECTED
Enteroaggregative E coli (EAEC): NOT DETECTED
Enteropathogenic E coli (EPEC): NOT DETECTED
Enterotoxigenic E coli (ETEC): NOT DETECTED
Giardia lamblia: NOT DETECTED
Norovirus GI/GII: NOT DETECTED
PLESIMONAS SHIGELLOIDES: NOT DETECTED
ROTAVIRUS A: NOT DETECTED
SALMONELLA SPECIES: NOT DETECTED
SAPOVIRUS (I, II, IV, AND V): DETECTED — AB
SHIGELLA/ENTEROINVASIVE E COLI (EIEC): NOT DETECTED
Shiga like toxin producing E coli (STEC): NOT DETECTED
VIBRIO SPECIES: NOT DETECTED
Vibrio cholerae: NOT DETECTED
YERSINIA ENTEROCOLITICA: NOT DETECTED

## 2017-08-11 IMAGING — DX DG CHEST 2V
2 series · 2 of 2 positions shown · non-contrast
Comparison: 10/09/2012

CLINICAL DATA: Fever, hives for 2 days

EXAM:
CHEST  2 VIEW

[chest pa]
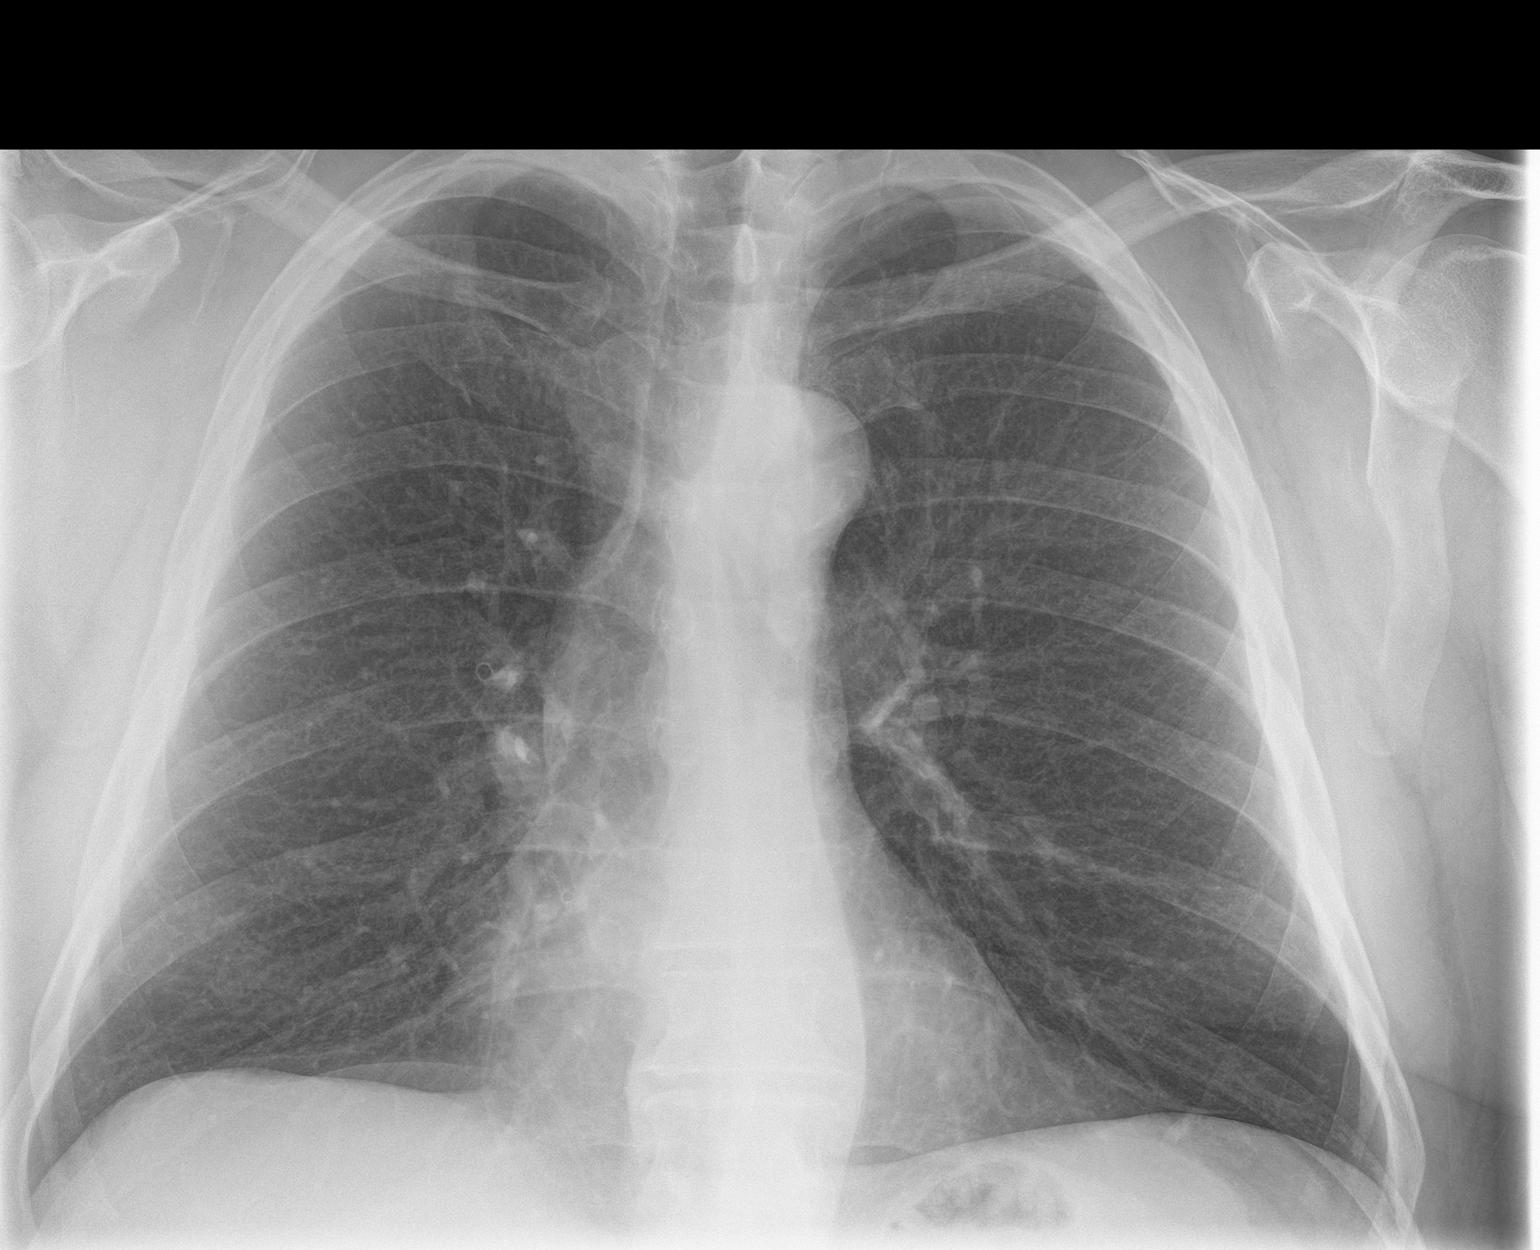

[chest lat]
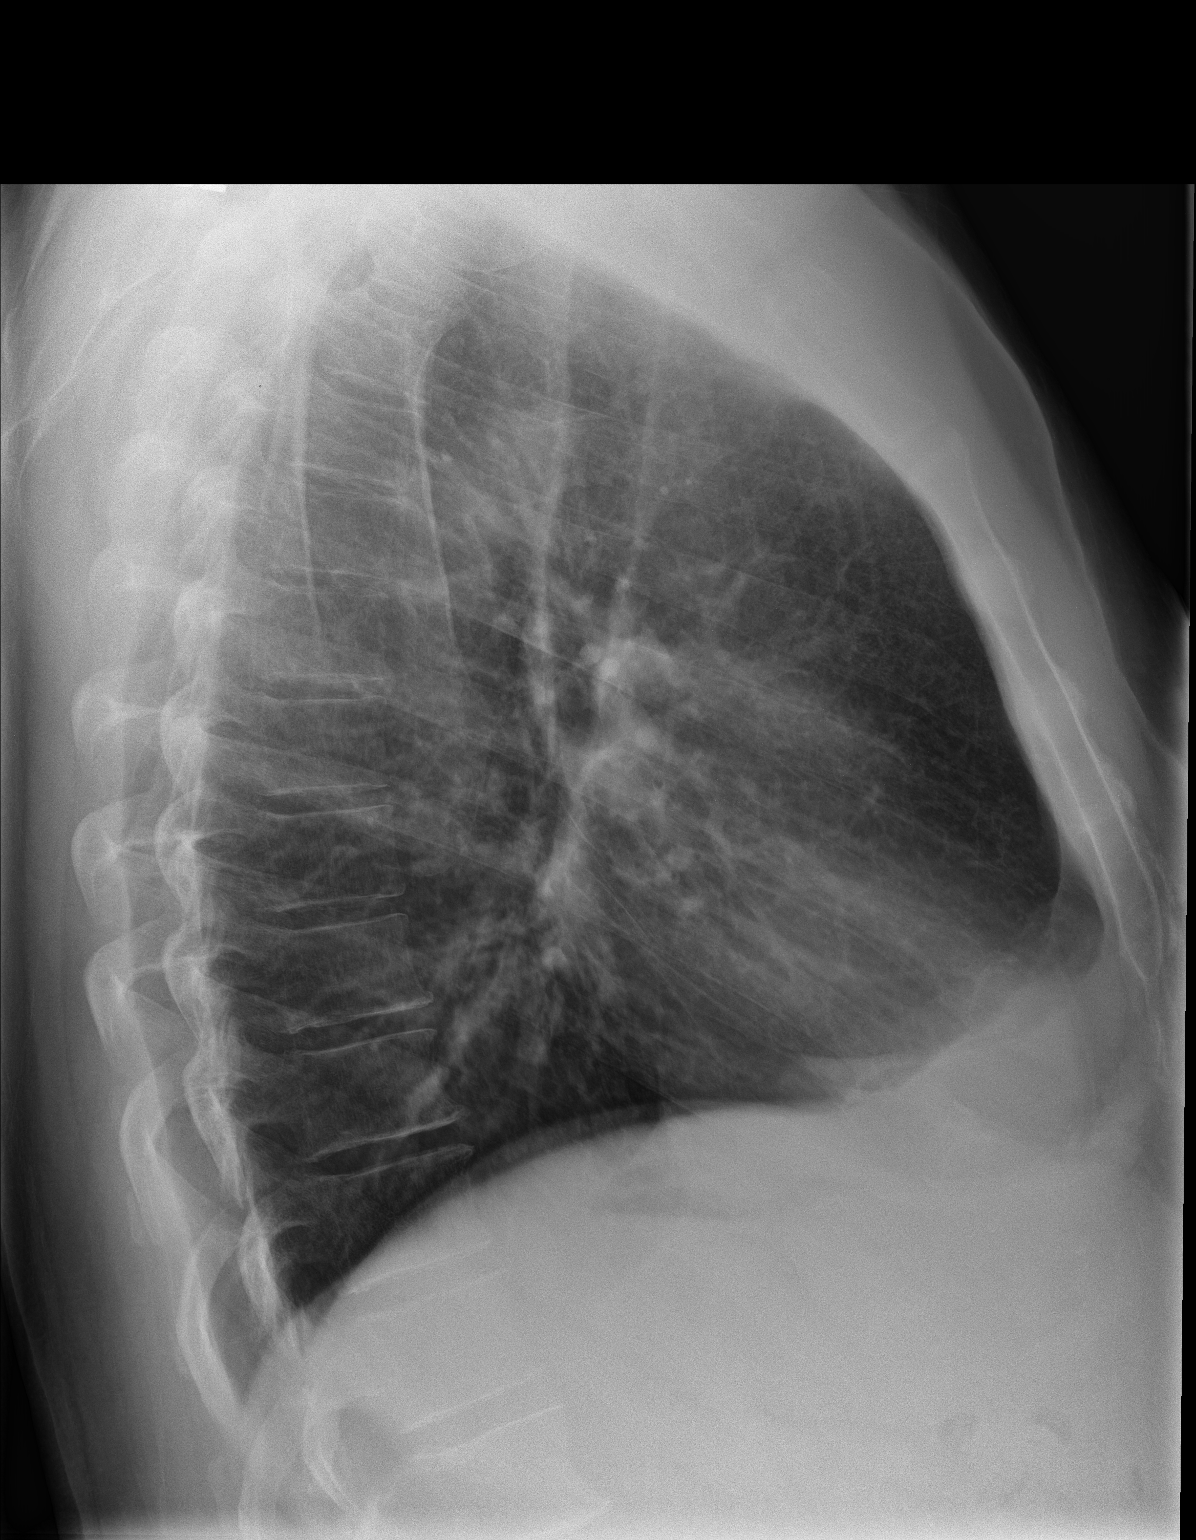

[2 of 2 positions shown; findings below may reference images not displayed]

FINDINGS: The heart size and mediastinal contours are within normal limits.
Both lungs are clear. The visualized skeletal structures are
unremarkable.
IMPRESSION: No active cardiopulmonary disease.

## 2017-09-28 DIAGNOSIS — Z0001 Encounter for general adult medical examination with abnormal findings: Secondary | ICD-10-CM | POA: Diagnosis not present

## 2017-11-14 ENCOUNTER — Ambulatory Visit (INDEPENDENT_AMBULATORY_CARE_PROVIDER_SITE_OTHER): Payer: 59 | Admitting: Orthopedic Surgery

## 2017-11-14 ENCOUNTER — Ambulatory Visit (INDEPENDENT_AMBULATORY_CARE_PROVIDER_SITE_OTHER): Payer: 59

## 2017-11-14 ENCOUNTER — Telehealth: Payer: Self-pay | Admitting: Radiology

## 2017-11-14 ENCOUNTER — Encounter: Payer: Self-pay | Admitting: Orthopedic Surgery

## 2017-11-14 VITALS — BP 157/93 | HR 77 | Ht 70.0 in | Wt 242.0 lb

## 2017-11-14 DIAGNOSIS — S46012A Strain of muscle(s) and tendon(s) of the rotator cuff of left shoulder, initial encounter: Secondary | ICD-10-CM

## 2017-11-14 DIAGNOSIS — M25511 Pain in right shoulder: Secondary | ICD-10-CM

## 2017-11-14 DIAGNOSIS — G8929 Other chronic pain: Secondary | ICD-10-CM

## 2017-11-14 NOTE — Progress Notes (Signed)
Progress Note new problem  Patient ID: Ryan Hickman, male   DOB: 1961/01/17, 57 y.o.   MRN: [id number] Chief Complaint  Patient presents with  . Shoulder Pain    right      Chief Complaint  Patient presents with  . Shoulder Pain    right     57 year old male presents for evaluation of new onset right shoulder pain  Location right anterior shoulder.  Duration 2 months.  Quality dull.  Severity moderate.  Associated with painful range of motion and weakness in the right shoulder.  He is 57 years old he was tossing a garbage bag into the trash can he felt a ripping sensation in his right shoulder and arm near the upper part of the shoulder and since that time despite using Tylenol with codeine ice heat and hydrocodone he has not improved    Review of Systems  Musculoskeletal:       Left shoulder pain improved  Neurological: Positive for focal weakness. Negative for tingling, sensory change and weakness.   Current Meds  Medication Sig  . acetaminophen-codeine (TYLENOL #3) 300-30 MG tablet Take 1 tablet by mouth every 6 (six) hours as needed.  Marland Kitchen amLODipine-olmesartan (AZOR) 10-40 MG tablet Take 1 tablet by mouth daily.  Marland Kitchen atorvastatin (LIPITOR) 80 MG tablet Take 80 mg by mouth daily before breakfast.   . buPROPion (WELLBUTRIN XL) 300 MG 24 hr tablet Take 300 mg daily by mouth.  . diphenoxylate-atropine (LOMOTIL) 2.5-0.025 MG tablet Take 1 tablet by mouth 4 (four) times daily as needed for diarrhea or loose stools.  Marland Kitchen ibuprofen (ADVIL,MOTRIN) 800 MG tablet Take 1 tablet (800 mg total) by mouth every 8 (eight) hours as needed.  Marland Kitchen LORazepam (ATIVAN) 2 MG tablet Take 3 mg by mouth at bedtime.   . meloxicam (MOBIC) 15 MG tablet Take 15 mg by mouth daily before breakfast.  . omeprazole (PRILOSEC) 20 MG capsule Take 20 mg by mouth daily before breakfast.     Past Medical History:  Diagnosis Date  . Acid reflux   . Arthritis   . Childhood asthma   . Depression   . History of ETOH  abuse   . Hyperlipidemia   . Hypertension   . Insomnia   . Sleep apnea    uses CPAP, "off and on".  . Urticaria      Allergies  Allergen Reactions  . Daypro [Oxaprozin] Anaphylaxis  . Hysingla Er [Hydrocodone Bitartrate] Hives  . Levofloxacin Hives     BP (!) 157/93   Pulse 77   Ht 5\' 10"  (1.778 m)   Wt 242 lb (109.8 kg)   BMI 34.72 kg/m    Physical Exam  Constitutional: He is oriented to person, place, and time. He appears well-developed and well-nourished.  Vital signs have been reviewed and are stable. Gen. appearance the patient is well-developed and well-nourished with normal grooming and hygiene.   Neurological: He is alert and oriented to person, place, and time.  Skin: Skin is warm and dry. No erythema.  Psychiatric: He has a normal mood and affect.  Vitals reviewed.   Right Shoulder Exam   Tenderness  The patient is experiencing tenderness in the biceps tendon (Anterior joint line).  Range of Motion  Active abduction: normal  Passive abduction: normal  Extension: normal  External rotation: normal  Forward flexion: normal  Internal rotation 0 degrees: abnormal   Muscle Strength  The patient has normal right shoulder strength. External rotation: 4/5  Tests  Apprehension: negative Cross arm: negative Impingement: positive  Other  Erythema: absent Sensation: normal Pulse: present  Comments:  Pectoralis tendon function normal   Left Shoulder Exam  Left shoulder exam is normal.  Tenderness  The patient is experiencing no tenderness.   Range of Motion  The patient has normal left shoulder ROM.  Muscle Strength  The patient has normal left shoulder strength.  Tests  Apprehension: negative  Other  Erythema: absent Sensation: normal Pulse: present       Arther Abbott, MD   MEDICAL DECISION MAKING   Imaging:  [facility] x-ray see report x-ray shows no fracture dislocation type III acromion  NEW PROBLEM   Encounter Diagnoses  Name Primary?  . Chronic right shoulder pain Yes  . Traumatic incomplete tear of left rotator cuff, initial encounter      PLAN: (RX., injection, surgery,frx,mri/ct, XR 2 body ares) MRI right shoulder  No orders of the defined types were placed in this encounter.  10:55 AM 11/14/2017

## 2017-11-14 NOTE — Telephone Encounter (Signed)
No authorization is required for MRI scan with his [facility] plan. I have called to schedule he needs a Wed. Scheduled for Wed Aug 7th at 11am, 10:30 arrival

## 2017-11-14 NOTE — Addendum Note (Signed)
Addended byCandice Camp on: 11/14/2017 10:58 AM   Modules accepted: Orders

## 2017-11-28 ENCOUNTER — Ambulatory Visit (HOSPITAL_COMMUNITY)
Admission: RE | Admit: 2017-11-28 | Discharge: 2017-11-28 | Disposition: A | Discharge status: Home / Self Care | Payer: 59 | Source: Ambulatory Visit | Attending: Orthopedic Surgery | Admitting: Orthopedic Surgery

## 2017-11-28 DIAGNOSIS — M25511 Pain in right shoulder: Secondary | ICD-10-CM | POA: Diagnosis not present

## 2017-11-28 DIAGNOSIS — M75121 Complete rotator cuff tear or rupture of right shoulder, not specified as traumatic: Secondary | ICD-10-CM | POA: Insufficient documentation

## 2017-12-19 ENCOUNTER — Ambulatory Visit (INDEPENDENT_AMBULATORY_CARE_PROVIDER_SITE_OTHER): Payer: 59 | Admitting: Orthopedic Surgery

## 2017-12-19 ENCOUNTER — Encounter: Payer: Self-pay | Admitting: Orthopedic Surgery

## 2017-12-19 VITALS — BP 140/91 | HR 82 | Ht 70.0 in | Wt 242.0 lb

## 2017-12-19 DIAGNOSIS — S46011D Strain of muscle(s) and tendon(s) of the rotator cuff of right shoulder, subsequent encounter: Secondary | ICD-10-CM

## 2017-12-19 DIAGNOSIS — F17210 Nicotine dependence, cigarettes, uncomplicated: Secondary | ICD-10-CM | POA: Diagnosis not present

## 2017-12-19 NOTE — Progress Notes (Signed)
MRI follow-up  Chief Complaint  Patient presents with  . Shoulder Pain    right     57 year old male injured his right shoulder throwing object did not improve with nonoperative treatment went for an MRI which shows a full-thickness complete tear of the supraspinatus tendon with moderate tendon retraction no atrophy tendinosis infraspinatus subscapularis biceps and labrum are normal AC joint looks arthritic  Review of Systems  Musculoskeletal:       Left shoulder pain improved  Neurological: Positive for focal weakness. Negative for tingling, sensory change and weakness.    unchanged since July 24  BP (!) 140/91   Pulse 82   Ht 5\' 10"  (1.778 m)   Wt 242 lb (109.8 kg)   BMI 34.72 kg/m   MRI shows a torn right rotator cuff with 3 cm of retraction and a 2 cm front to back defect biceps normal labrum mild tendinosis he does have a type II acromion with some AC joint arthritis does not look bad to me on MRI  He will need a right rotator cuff repair probably be out of work for a year  The majority of this visit was spent counseling regarding surgical intervention postop care long-term plans for postop protocols  Time spent 15 minutes  [id number]

## 2017-12-19 NOTE — Patient Instructions (Signed)
Rotator cuff tear right shoulder  Patient will need surgery  Patient will be out of work for 12 months

## 2018-01-02 DIAGNOSIS — Z23 Encounter for immunization: Secondary | ICD-10-CM | POA: Diagnosis not present

## 2018-01-30 ENCOUNTER — Encounter: Payer: Self-pay | Admitting: Orthopedic Surgery

## 2018-01-30 ENCOUNTER — Ambulatory Visit (INDEPENDENT_AMBULATORY_CARE_PROVIDER_SITE_OTHER): Payer: 59 | Admitting: Orthopedic Surgery

## 2018-01-30 VITALS — BP 157/93 | HR 85 | Ht 70.0 in | Wt 248.0 lb

## 2018-01-30 DIAGNOSIS — S46011D Strain of muscle(s) and tendon(s) of the rotator cuff of right shoulder, subsequent encounter: Secondary | ICD-10-CM

## 2018-01-30 NOTE — Patient Instructions (Addendum)
Patient has a torn rotator cuff  Surgery scheduled for November 14 (rotator cuff repair with distal clavicle excison and Superior capsular reconstruction)  He will be out of work 1 year  Advised stop smoking   Surgery for Rotator Cuff Tear The rotator cuff is a group of muscles and connective tissues (tendons) that surround the shoulder joint and keep the upper arm bone (humerus) in the shoulder socket. A tendon is the place on a muscle where it attaches to a bone. Surgery may be done to repair a partial or complete tear in the rotator cuff that cannot be treated by nonsurgical methods. The exact procedure that you have depends on your injury. If you have a partial tear, you may have surgery to reattach a tendon to the humerus. If you have a complete tear, you may have surgery to sew the two sides of the tear back together. Surgery may be done through small incisions using an operating telescope (arthroscope), through a larger (open) incision, or through a combination of both. Tell a health care provider about:  Any allergies you have.  All medicines you are taking, including vitamins, herbs, eye drops, creams, and over-the-counter medicines.  Any problems you or family members have had with anesthetic medicines.  Any blood disorders you have.  Any surgeries you have had.  Any medical conditions you have.  Whether you are pregnant or may be pregnant. What are the risks? Generally, this is a safe procedure. However, problems may occur, including:  Infection.  Bleeding.  Allergic reactions to medicines or materials used during the procedure.  Damage to nerves, blood vessels, or shoulder muscles.  Permanent loss of full shoulder movement (stiffness).  What happens before the procedure? Staying hydrated Follow instructions from your health care provider about hydration, which may include:  Up to 2 hours before the procedure - you may continue to drink clear liquids, such as  water, clear fruit juice, black coffee, and plain tea.  Eating and drinking restrictions Follow instructions from your health care provider about eating and drinking, which may include:  8 hours before the procedure - stop eating heavy meals or foods such as meat, fried foods, or fatty foods.  6 hours before the procedure - stop eating light meals or foods, such as toast or cereal.  6 hours before the procedure - stop drinking milk or drinks that contain milk.  2 hours before the procedure - stop drinking clear liquids.  Other instructions  Ask your health care provider about: ? Changing or stopping your regular medicines. This is especially important if you are taking diabetes medicines or blood thinners. ? Taking medicines such as aspirin and ibuprofen. These medicines can thin your blood. Do not take these medicines before your procedure if your health care provider instructs you not to.  Plan to have someone take you home from the hospital or clinic.  If you will be going home right after the procedure, plan to have someone with you for 24 hours.  Ask your health care provider how your surgical site will be marked or identified.  You may be given antibiotic medicine to help prevent infection. What happens during the procedure?  To reduce your risk of infection: ? Your health care team will wash or sanitize their hands. ? Your skin will be washed with soap.  An IV tube will be inserted into one of your veins.  You will be given one or more of the following: ? A medicine to help  you relax (sedative). ? A medicine to make you fall asleep (general anesthetic). ? A medicine that is injected into an area of your body to numb everything beyond the injection site (regional anesthetic).  Your surgeon will move your shoulder to observe your injury.  If you are having arthroscopic surgery: ? Small incisions will be made in the front and back of your shoulder. ? An arthroscope will  be inserted through these incisions to examine the inside of your shoulder and plan the surgery.  If you are having open surgery, a wider incision will be made in your shoulder.  Some of the muscle covering your shoulder (deltoid) may be moved to expose your rotator cuff.  Bony growths that might interfere with healing will be removed.  Your rotator cuff will be trimmed around the area where it has torn away from your humerus.  If your rotator cuff is completely torn, the split ends will be sewn back together.  Anchoring inserts will be placed into your humerus in the area where the tendon has torn away from the bone.  The torn end of your rotator cuff will be re-attached (anchored) to your humerus using stitches and small screws.  Your incisions will be closed with sutures.  The incision in your skin will be covered with a bandage (dressing) and medicine.  Your arm will be placed in a sling. The procedure may vary among health care providers and hospitals. What happens after the procedure?  Your blood pressure, heart rate, breathing rate, and blood oxygen level will be monitored often until the medicines you were given have worn off.  You may have some pain. Medicines will be available to help you.  Do not drive for 24 hours if you received a sedative, or until your health care provider approves. This information is not intended to replace advice given to you by your health care provider. Make sure you discuss any questions you have with your health care provider. Document Released: 04/24/2015 Document Revised: 09/16/2015 Document Reviewed: 04/24/2015 Elsevier Interactive Patient Education  [employer].

## 2018-01-30 NOTE — Progress Notes (Signed)
MRI follow-up  Chief Complaint  Patient presents with  . Shoulder Pain    right    History unchanged as noted below 57 year old male injured his right shoulder throwing object did not improve with nonoperative treatment went for an MRI which shows a full-thickness complete tear of the supraspinatus tendon with moderate tendon retraction no atrophy tendinosis infraspinatus subscapularis biceps and labrum are normal AC joint looks arthritic  Comes in today complaining of more pain cannot sleep at night wants to have surgery  Says he is cut back on the smoking   Review of systems no change from December 19, 2017 Review of Systems  Musculoskeletal:       Left shoulder pain improved  Neurological: Positive for focal weakness. Negative for tingling, sensory change and weakness.   Past Medical History:  Diagnosis Date  . Acid reflux   . Arthritis   . Childhood asthma   . Depression   . History of ETOH abuse   . Hyperlipidemia   . Hypertension   . Insomnia   . Sleep apnea    uses CPAP, "off and on".  . Urticaria    Past Surgical History:  Procedure Laterality Date  . COLONOSCOPY  May 2010   Dr. Oneida Alar: simple adenoma, normal colon. Repeat 2020  . EAR CYST EXCISION N/A 11/14/2013   Procedure: EXCISION SEBACEOUS CYST CHEST WALL;  Surgeon: Jamesetta So, MD;  Location: [facility];  Service: General;  Laterality: N/A;  . KNEE ARTHROSCOPY WITH MEDIAL MENISECTOMY Left 08/24/2016   Procedure: KNEE ARTHROSCOPY WITH MEDIAL MENISECTOMY;  Surgeon: Carole Civil, MD;  Location: [facility];  Service: Orthopedics;  Laterality: Left;  . KNEE SURGERY Right 1992  . UPPER GASTROINTESTINAL ENDOSCOPY  May 2010   Dr. Oneida Alar: normal   Family History  Problem Relation Age of Onset  . Heart failure Other   . Colon cancer Neg Hx   . Allergic rhinitis Neg Hx   . Asthma Neg Hx   . Eczema Neg Hx   . Urticaria Neg Hx    Social History   Tobacco Use  . Smoking status: Current Every Day Smoker   Packs/day: 1.00    Years: 25.00    Pack years: 25.00    Types: Cigarettes  . Smokeless tobacco: Never Used  Substance Use Topics  . Alcohol use: No    Comment: former  . Drug use: No     BP (!) 157/93   Pulse 85   Ht 5\' 10"  (1.778 m)   Wt 248 lb (112.5 kg)   BMI 35.58 kg/m    Physical Exam  Constitutional: He is oriented to person, place, and time. He appears well-developed and well-nourished.  Musculoskeletal:       Arms: Neurological: He is alert and oriented to person, place, and time. He displays normal reflexes. No sensory deficit. He exhibits normal muscle tone. Coordination normal.  Skin: Skin is warm and dry. Capillary refill takes less than 2 seconds. No rash noted. No erythema. No pallor.  Psychiatric: He has a normal mood and affect. His behavior is normal. Judgment and thought content normal.     MRI shows a torn right rotator cuff with 3 cm of retraction and a 2 cm front to back defect biceps normal labrum mild tendinosis he does have a type II acromion with some AC joint arthritis does not look bad to me on MRI  He will need a right rotator cuff repair probably be out of work for  a year  He will be out of work for a year.  He will need a graft on standby in case we cannot get the tendon all the way back.  Open rotator cuff repair possible graft for superior capsular reconstruction and distal clavicle excision  The procedure has been fully reviewed with the patient; The risks and benefits of surgery have been discussed and explained and understood. Alternative treatment has also been reviewed, questions were encouraged and answered. The postoperative plan is also been reviewed.

## 2018-02-21 NOTE — Patient Instructions (Signed)
Ryan Hickman  02/21/2018     @PREFPERIOPPHARMACY @   Your procedure is scheduled on  03/07/2018 .  Report to [facility] at  615  A.M.  Call this number if you have problems the morning of surgery:  [phone]   Remember:  Do not eat or drink after midnight.                         Take these medicines the morning of surgery with A SIP OF WATER azor, wellbutrin, anabuse, mobic ( if needed), prilosec.    Do not wear jewelry, make-up or nail polish.  Do not wear lotions, powders, or perfumes, or deodorant.  Do not shave 48 hours prior to surgery.  Men may shave face and neck.  Do not bring valuables to the hospital.  [facility] is not responsible for any belongings or valuables.  Contacts, dentures or bridgework may not be worn into surgery.  Leave your suitcase in the car.  After surgery it may be brought to your room.  For patients admitted to the hospital, discharge time will be determined by your treatment team.  Patients discharged the day of surgery will not be allowed to drive home.   Name and phone number of your driver:   family Special instructions:  None  Please read over the following fact sheets that you were given. Anesthesia Post-op Instructions and Care and Recovery After Surgery       Surgery for Rotator Cuff Tear, Care After Refer to this sheet in the next few weeks. These instructions provide you with information about caring for yourself after your procedure. Your health care provider may also give you more specific instructions. Your treatment has been planned according to current medical practices, but problems sometimes occur. Call your health care provider if you have any problems or questions after your procedure. What can I expect after the procedure? After the procedure, it is common to have:  Swelling.  Pain.  Stiffness.  Tenderness.  Follow these instructions at home: If you have a sling:  Wear the sling as told  by your health care provider. Remove it only as told by your health care provider.  Loosen the sling if your fingers tingle, become numb, or turn cold and blue.  Do not let your sling get wet if it is not waterproof.  Keep the sling clean. Bathing  Do not take baths, swim, or use a hot tub until your health care provider approves. Ask your health care provider if you can take showers. You may only be allowed to take sponge baths for bathing.  Keep your bandage (dressing) dry until your health care provider says it can be removed. Incision care  Follow instructions from your health care provider about how to take care of your incision. Make sure you: ? Wash your hands with soap and water before you change your dressing. If soap and water are not available, use hand sanitizer. ? Change your dressing as told by your health care provider. ? Leave stitches (sutures), skin glue, or adhesive strips in place. These skin closures may need to stay in place for 2 weeks or longer. If adhesive strip edges start to loosen and curl up, you may trim the loose edges. Do not remove adhesive strips completely unless your health care provider tells you to do that.  Check your incision area every day for signs  of infection. Check for: ? More redness, swelling, or pain. ? More fluid or blood. ? Warmth. ? Pus or a bad smell. Managing pain, stiffness, and swelling   If directed, put ice on your shoulder area. ? Put ice in a plastic bag. ? Place a towel between your skin and the bag. ? Leave the ice on for 20 minutes, 2-3 times a day.  Move your fingers often to avoid stiffness and to lessen swelling.  Raise (elevate) your upper body on pillows when you lie down and when you sleep. ? Do not sleep on the front of your body (abdomen). ? Do not sleep on the side that your surgery was performed on. Driving  Do not drive for 24 hours if you received a medicine to help you relax (sedative) during your  procedure.  Do not drive or operate heavy machinery while taking prescription pain medicine.  Ask your health care provider when it is safe for you to drive. Activity  Do not use your arm to support your body weight until your health care provider approves.  Do not lift or hold anything with your arm until your health care provider approves.  Return to your normal activities as told by your health care provider. Ask your health care provider what activities are safe for you.  Do exercises as told by your health care provider. General instructions   Do not use any tobacco products, such as cigarettes, chewing tobacco, or e-cigarettes. Tobacco can delay healing. If you need help quitting, ask your health care provider.  Take over-the-counter and prescription medicines only as told by your health care provider.  If you were prescribed an antibiotic medicine, take it as told by your health care provider. Do not stop taking the antibiotic even if you start to feel better.  Keep all follow-up visits as told by your health care provider. This is important. Contact a health care provider if:  You have a fever.  You have more redness, swelling, or pain around your incision.  You have more fluid or blood coming from your incision.  Your incision feels warm to the touch.  You have pus or a bad smell coming from your incision.  You have pain that gets worse or does not get better with medicine. Get help right away if:  You have severe pain.  You lose feeling in your arm or hand.  Your hand or fingers turn very pale or blue. This information is not intended to replace advice given to you by your health care provider. Make sure you discuss any questions you have with your health care provider. Document Released: 04/10/2005 Document Revised: 12/15/2015 Document Reviewed: 04/24/2015 Elsevier Interactive Patient Education  2018 [place] Cuff Injury Rotator cuff injury  is any type of injury to the set of muscles and tendons that make up the stabilizing unit of your shoulder. This unit holds the ball of your upper arm bone (humerus) in the socket of your shoulder blade (scapula). What are the causes? Injuries to your rotator cuff most commonly come from sports or activities that cause your arm to be moved repeatedly over your head. Examples of this include throwing, weight lifting, swimming, or racquet sports. Long lasting (chronic) irritation of your rotator cuff can cause soreness and swelling (inflammation), bursitis, and eventual damage to your tendons, such as a tear (rupture). What are the signs or symptoms? Acute rotator cuff tear:  Sudden tearing sensation followed by severe pain shooting from  your upper shoulder down your arm toward your elbow.  Decreased range of motion of your shoulder because of pain and muscle spasm.  Severe pain.  Inability to raise your arm out to the side because of pain and loss of muscle power (large tears).  Chronic rotator cuff tear:  Pain that usually is worse at night and may interfere with sleep.  Gradual weakness and decreased shoulder motion as the pain worsens.  Decreased range of motion.  Rotator cuff tendinitis:  Deep ache in your shoulder and the outside upper arm over your shoulder.  Pain that comes on gradually and becomes worse when lifting your arm to the side or turning it inward.  How is this diagnosed? Rotator cuff injury is diagnosed through a medical history, physical exam, and imaging exam. The medical history helps determine the type of rotator cuff injury. Your health care provider will look at your injured shoulder, feel the injured area, and ask you to move your shoulder in different positions. X-ray exams typically are done to rule out other causes of shoulder pain, such as fractures. MRI is the exam of choice for the most severe shoulder injuries because the images show muscles and  tendons. How is this treated? Chronic tear:  Medicine for pain, such as acetaminophen or ibuprofen.  Physical therapy and range-of-motion exercises may be helpful in maintaining shoulder function and strength.  Steroid injections into your shoulder joint.  Surgical repair of the rotator cuff if the injury does not heal with noninvasive treatment.  Acute tear:  Anti-inflammatory medicines such as ibuprofen and naproxen to help reduce pain and swelling.  A sling to help support your arm and rest your rotator cuff muscles. Long-term use of a sling is not advised. It may cause significant stiffening of the shoulder joint.  Surgery may be considered within a few weeks, especially in younger, active people, to return the shoulder to full function.  Indications for surgical treatment include the following: ? Age younger than 63 years. ? Rotator cuff tears that are complete. ? Physical therapy, rest, and anti-inflammatory medicines have been used for 6-8 weeks, with no improvement. ? Employment or sporting activity that requires constant shoulder use.  Tendinitis:  Anti-inflammatory medicines such as ibuprofen and naproxen to help reduce pain and swelling.  A sling to help support your arm and rest your rotator cuff muscles. Long-term use of a sling is not advised. It may cause significant stiffening of the shoulder joint.  Severe tendinitis may require: ? Steroid injections into your shoulder joint. ? Physical therapy. ? Surgery.  Follow these instructions at home:  Apply ice to your injury: ? Put ice in a plastic bag. ? Place a towel between your skin and the bag. ? Leave the ice on for 20 minutes, 2-3 times a day.  If you have a shoulder immobilizer (sling and straps), wear it until told otherwise by your health care provider.  You may want to sleep on several pillows or in a recliner at night to lessen swelling and pain.  Only take over-the-counter or prescription  medicines for pain, discomfort, or fever as directed by your health care provider.  Do simple hand squeezing exercises with a soft rubber ball to decrease hand swelling. Contact a health care provider if:  Your shoulder pain increases, or new pain or numbness develops in your arm, hand, or fingers.  Your hand or fingers are colder than your other hand. Get help right away if:  Your arm, hand,  or fingers are numb or tingling.  Your arm, hand, or fingers are increasingly swollen and painful, or they turn white or blue. This information is not intended to replace advice given to you by your health care provider. Make sure you discuss any questions you have with your health care provider. Document Released: 04/07/2000 Document Revised: 09/16/2015 Document Reviewed: 11/20/2012 Elsevier Interactive Patient Education  2018 [place] Anesthesia, Adult General anesthesia is the use of medicines to make a person "go to sleep" (be unconscious) for a medical procedure. General anesthesia is often recommended when a procedure:  Is long.  Requires you to be still or in an unusual position.  Is major and can cause you to lose blood.  Is impossible to do without general anesthesia.  The medicines used for general anesthesia are called general anesthetics. In addition to making you sleep, the medicines:  Prevent pain.  Control your blood pressure.  Relax your muscles.  Tell a health care provider about:  Any allergies you have.  All medicines you are taking, including vitamins, herbs, eye drops, creams, and over-the-counter medicines.  Any problems you or family members have had with anesthetic medicines.  Types of anesthetics you have had in the past.  Any bleeding disorders you have.  Any surgeries you have had.  Any medical conditions you have.  Any history of heart or lung conditions, such as heart failure, sleep apnea, or chronic obstructive pulmonary disease  (COPD).  Whether you are pregnant or may be pregnant.  Whether you use tobacco, alcohol, marijuana, or street drugs.  Any history of Armed forces logistics/support/administrative officer.  Any history of depression or anxiety. What are the risks? Generally, this is a safe procedure. However, problems may occur, including:  Allergic reaction to anesthetics.  Lung and heart problems.  Inhaling food or liquids from your stomach into your lungs (aspiration).  Injury to nerves.  Waking up during your procedure and being unable to move (rare).  Extreme agitation or a state of mental confusion (delirium) when you wake up from the anesthetic.  Air in the bloodstream, which can lead to stroke.  These problems are more likely to develop if you are having a major surgery or if you have an advanced medical condition. You can prevent some of these complications by answering all of your health care provider's questions thoroughly and by following all pre-procedure instructions. General anesthesia can cause side effects, including:  Nausea or vomiting  A sore throat from the breathing tube.  Feeling cold or shivery.  Feeling tired, washed out, or achy.  Sleepiness or drowsiness.  Confusion or agitation.  What happens before the procedure? Staying hydrated Follow instructions from your health care provider about hydration, which may include:  Up to 2 hours before the procedure - you may continue to drink clear liquids, such as water, clear fruit juice, black coffee, and plain tea.  Eating and drinking restrictions Follow instructions from your health care provider about eating and drinking, which may include:  8 hours before the procedure - stop eating heavy meals or foods such as meat, fried foods, or fatty foods.  6 hours before the procedure - stop eating light meals or foods, such as toast or cereal.  6 hours before the procedure - stop drinking milk or drinks that contain milk.  2 hours before the procedure  - stop drinking clear liquids.  Medicines  Ask your health care provider about: ? Changing or stopping your regular medicines. This is especially  important if you are taking diabetes medicines or blood thinners. ? Taking medicines such as aspirin and ibuprofen. These medicines can thin your blood. Do not take these medicines before your procedure if your health care provider instructs you not to. ? Taking new dietary supplements or medicines. Do not take these during the week before your procedure unless your health care provider approves them.  If you are told to take a medicine or to continue taking a medicine on the day of the procedure, take the medicine with sips of water. General instructions   Ask if you will be going home the same day, the following day, or after a longer hospital stay. ? Plan to have someone take you home. ? Plan to have someone stay with you for the first 24 hours after you leave the hospital or clinic.  For 3-6 weeks before the procedure, try not to use any tobacco products, such as cigarettes, chewing tobacco, and e-cigarettes.  You may brush your teeth on the morning of the procedure, but make sure to spit out the toothpaste. What happens during the procedure?  You will be given anesthetics through a mask and through an IV tube in one of your veins.  You may receive medicine to help you relax (sedative).  As soon as you are asleep, a breathing tube may be used to help you breathe.  An anesthesia specialist will stay with you throughout the procedure. He or she will help keep you comfortable and safe by continuing to give you medicines and adjusting the amount of medicine that you get. He or she will also watch your blood pressure, pulse, and oxygen levels to make sure that the anesthetics do not cause any problems.  If a breathing tube was used to help you breathe, it will be removed before you wake up. The procedure may vary among health care providers  and hospitals. What happens after the procedure?  You will wake up, often slowly, after the procedure is complete, usually in a recovery area.  Your blood pressure, heart rate, breathing rate, and blood oxygen level will be monitored until the medicines you were given have worn off.  You may be given medicine to help you calm down if you feel anxious or agitated.  If you will be going home the same day, your health care provider may check to make sure you can stand, drink, and urinate.  Your health care providers will treat your pain and side effects before you go home.  Do not drive for 24 hours if you received a sedative.  You may: ? Feel nauseous and vomit. ? Have a sore throat. ? Have mental slowness. ? Feel cold or shivery. ? Feel sleepy. ? Feel tired. ? Feel sore or achy, even in parts of your body where you did not have surgery. This information is not intended to replace advice given to you by your health care provider. Make sure you discuss any questions you have with your health care provider. Document Released: 07/18/2007 Document Revised: 09/21/2015 Document Reviewed: 03/25/2015 Elsevier Interactive Patient Education  2018 [place] Anesthesia, Adult, Care After These instructions provide you with information about caring for yourself after your procedure. Your health care provider may also give you more specific instructions. Your treatment has been planned according to current medical practices, but problems sometimes occur. Call your health care provider if you have any problems or questions after your procedure. What can I expect after the procedure? After the  procedure, it is common to have:  Vomiting.  A sore throat.  Mental slowness.  It is common to feel:  Nauseous.  Cold or shivery.  Sleepy.  Tired.  Sore or achy, even in parts of your body where you did not have surgery.  Follow these instructions at home: For at least 24 hours  after the procedure:  Do not: ? Participate in activities where you could fall or become injured. ? Drive. ? Use heavy machinery. ? Drink alcohol. ? Take sleeping pills or medicines that cause drowsiness. ? Make important decisions or sign legal documents. ? Take care of children on your own.  Rest. Eating and drinking  If you vomit, drink water, juice, or soup when you can drink without vomiting.  Drink enough fluid to keep your urine clear or pale yellow.  Make sure you have little or no nausea before eating solid foods.  Follow the diet recommended by your health care provider. General instructions  Have a responsible adult stay with you until you are awake and alert.  Return to your normal activities as told by your health care provider. Ask your health care provider what activities are safe for you.  Take over-the-counter and prescription medicines only as told by your health care provider.  If you smoke, do not smoke without supervision.  Keep all follow-up visits as told by your health care provider. This is important. Contact a health care provider if:  You continue to have nausea or vomiting at home, and medicines are not helpful.  You cannot drink fluids or start eating again.  You cannot urinate after 8-12 hours.  You develop a skin rash.  You have fever.  You have increasing redness at the site of your procedure. Get help right away if:  You have difficulty breathing.  You have chest pain.  You have unexpected bleeding.  You feel that you are having a life-threatening or urgent problem. This information is not intended to replace advice given to you by your health care provider. Make sure you discuss any questions you have with your health care provider. Document Released: 07/17/2000 Document Revised: 09/13/2015 Document Reviewed: 03/25/2015 Elsevier Interactive Patient Education  [employer].

## 2018-02-27 ENCOUNTER — Other Ambulatory Visit: Payer: Self-pay

## 2018-02-27 ENCOUNTER — Encounter (HOSPITAL_COMMUNITY)
Admission: RE | Admit: 2018-02-27 | Discharge: 2018-02-27 | Disposition: A | Discharge status: Home / Self Care | Payer: 59 | Source: Ambulatory Visit | Attending: Orthopedic Surgery | Admitting: Orthopedic Surgery

## 2018-02-27 ENCOUNTER — Encounter (HOSPITAL_COMMUNITY): Payer: Self-pay

## 2018-02-27 DIAGNOSIS — Z01818 Encounter for other preprocedural examination: Secondary | ICD-10-CM | POA: Insufficient documentation

## 2018-02-27 LAB — CBC WITH DIFFERENTIAL/PLATELET
Abs Immature Granulocytes: 0.04 10*3/uL (ref 0.00–0.07)
BASOS PCT: 1 %
Basophils Absolute: 0 10*3/uL (ref 0.0–0.1)
EOS ABS: 0.5 10*3/uL (ref 0.0–0.5)
EOS PCT: 6 %
HCT: 46.3 % (ref 39.0–52.0)
Hemoglobin: 15.6 g/dL (ref 13.0–17.0)
Immature Granulocytes: 1 %
Lymphocytes Relative: 30 %
Lymphs Abs: 2.4 10*3/uL (ref 0.7–4.0)
MCH: 30.1 pg (ref 26.0–34.0)
MCHC: 33.7 g/dL (ref 30.0–36.0)
MCV: 89.2 fL (ref 80.0–100.0)
MONO ABS: 0.7 10*3/uL (ref 0.1–1.0)
Monocytes Relative: 9 %
NEUTROS PCT: 53 %
Neutro Abs: 4.3 10*3/uL (ref 1.7–7.7)
PLATELETS: 177 10*3/uL (ref 150–400)
RBC: 5.19 MIL/uL (ref 4.22–5.81)
RDW: 12.2 % (ref 11.5–15.5)
WBC: 8 10*3/uL (ref 4.0–10.5)
nRBC: 0 % (ref 0.0–0.2)

## 2018-02-27 LAB — BASIC METABOLIC PANEL
ANION GAP: 7 (ref 5–15)
BUN: 12 mg/dL (ref 6–20)
CALCIUM: 9 mg/dL (ref 8.9–10.3)
CO2: 23 mmol/L (ref 22–32)
CREATININE: 0.84 mg/dL (ref 0.61–1.24)
Chloride: 109 mmol/L (ref 98–111)
GFR calc Af Amer: >60 mL/min (ref >60)
GLUCOSE: 78 mg/dL (ref 70–99)
Potassium: 3.9 mmol/L (ref 3.5–5.1)
Sodium: 139 mmol/L (ref 135–145)

## 2018-02-28 ENCOUNTER — Other Ambulatory Visit: Payer: Self-pay | Admitting: Orthopedic Surgery

## 2018-02-28 DIAGNOSIS — S46011D Strain of muscle(s) and tendon(s) of the rotator cuff of right shoulder, subsequent encounter: Secondary | ICD-10-CM

## 2018-03-05 NOTE — H&P (Signed)
Chief Complaint  Patient presents with  . Shoulder Pain      right     History unchanged as noted below 57 year old male injured his right shoulder throwing object did not improve with nonoperative treatment went for an MRI which shows a full-thickness complete tear of the supraspinatus tendon with moderate tendon retraction no atrophy tendinosis infraspinatus subscapularis biceps and labrum are normal AC joint looks arthritic   Comes in today complaining of more pain cannot sleep at night wants to have surgery   Says he is cut back on the smoking     Review of systems no change from December 19, 2017 Review of Systems  Musculoskeletal:       Left shoulder pain improved  Neurological: Positive for focal weakness. Negative for tingling, sensory change and weakness.        Past Medical History:  Diagnosis Date  . Acid reflux    . Arthritis    . Childhood asthma    . Depression    . History of ETOH abuse    . Hyperlipidemia    . Hypertension    . Insomnia    . Sleep apnea      uses CPAP, "off and on".  . Urticaria           Past Surgical History:  Procedure Laterality Date  . COLONOSCOPY   May 2010    Dr. Oneida Alar: simple adenoma, normal colon. Repeat 2020  . EAR CYST EXCISION N/A 11/14/2013    Procedure: EXCISION SEBACEOUS CYST CHEST WALL;  Surgeon: Jamesetta So, MD;  Location: [facility];  Service: General;  Laterality: N/A;  . KNEE ARTHROSCOPY WITH MEDIAL MENISECTOMY Left 08/24/2016    Procedure: KNEE ARTHROSCOPY WITH MEDIAL MENISECTOMY;  Surgeon: Carole Civil, MD;  Location: [facility];  Service: Orthopedics;  Laterality: Left;  . KNEE SURGERY Right 1992  . UPPER GASTROINTESTINAL ENDOSCOPY   May 2010    Dr. Oneida Alar: normal         Family History  Problem Relation Age of Onset  . Heart failure Other    . Colon cancer Neg Hx    . Allergic rhinitis Neg Hx    . Asthma Neg Hx    . Eczema Neg Hx    . Urticaria Neg Hx      Social History         Tobacco Use  . Smoking  status: Current Every Day Smoker      Packs/day: 1.00      Years: 25.00      Pack years: 25.00      Types: Cigarettes  . Smokeless tobacco: Never Used  Substance Use Topics  . Alcohol use: No      Comment: former  . Drug use: No        BP (!) 157/93   Pulse 85   Ht 5\' 10"  (1.778 m)   Wt 248 lb (112.5 kg)   BMI 35.58 kg/m     Physical Exam  Constitutional: He is oriented to person, place, and time. He appears well-developed and well-nourished.  Musculoskeletal:       Arms: Neurological: He is alert and oriented to person, place, and time. He displays normal reflexes. No sensory deficit. He exhibits normal muscle tone. Coordination normal.  Skin: Skin is warm and dry. Capillary refill takes less than 2 seconds. No rash noted. No erythema. No pallor.  Psychiatric: He has a normal mood and affect. His behavior is normal. Judgment and  thought content normal.        MRI shows a torn right rotator cuff with 3 cm of retraction and a 2 cm front to back defect biceps normal labrum mild tendinosis he does have a type II acromion with some AC joint arthritis does not look bad to me on MRI  CLINICAL DATA:  Shoulder pain following injury throwing an object. Limited mobility. No previous relevant surgery.   EXAM: MRI OF THE RIGHT SHOULDER WITHOUT CONTRAST   TECHNIQUE: Multiplanar, multisequence MR imaging of the shoulder was performed. No intravenous contrast was administered.   COMPARISON:  Radiographs 11/14/2017.   FINDINGS: Rotator cuff: There is a full-thickness, essentially complete insertional tear of the supraspinatus tendon. There is approximately 2.8 cm of tendon retraction on coronal image number 10/5. AP defect in the cuff measures approximately 2 cm on sagittal image 17. There is tendinosis and partial tearing of the infraspinatus and subscapularis tendons. The teres minor tendon appears normal.   Muscles:  No focal muscular atrophy or edema.   Biceps long head:  Intact and normally positioned. Mild tendinosis of the intra-articular portion.   Acromioclavicular Joint: The acromion is type 2. There are moderate acromioclavicular degenerative changes. There is a moderate amount of fluid in the subacromial-subdeltoid bursa, communicating with the shoulder joint via the rotator cuff tear.   Glenohumeral Joint: No significant shoulder joint effusion or glenohumeral arthropathy.   Labrum: Mild superior labral degeneration. No labral tear or paralabral cyst identified.   Bones: No acute or significant extra-articular osseous findings.   Other: There are probable ganglia anteriorly, superficial to the subscapularis tendon and lesser tuberosity and medial to the bicipital groove, best seen on coronal image 4/4.   IMPRESSION: 1. Full-thickness, essentially complete insertional tear of the supraspinatus tendon with moderate tendon retraction. No muscular atrophy. 2. Tendinosis and partial tearing of the infraspinatus and subscapularis tendons. 3. No evidence of labral or biceps tendon tear. 4. Moderate acromioclavicular degenerative changes.     Electronically Signed   By: Richardean Sale M.D.   On: 11/28/2017 14:02    He will need a right rotator cuff repair probably be out of work for a year   He will be out of work for a year.  He will need a graft on standby in case we cannot get the tendon all the way back.   Open rotator cuff repair possible graft for superior capsular reconstruction and distal clavicle excision  Right shoulder   The procedure has been fully reviewed with the patient; The risks and benefits of surgery have been discussed and explained and understood. Alternative treatment has also been reviewed, questions were encouraged and answered. The postoperative plan is also been reviewed.

## 2018-03-07 ENCOUNTER — Encounter (HOSPITAL_COMMUNITY): Payer: Self-pay | Admitting: Anesthesiology

## 2018-03-07 ENCOUNTER — Ambulatory Visit (HOSPITAL_COMMUNITY): Payer: 59 | Admitting: Anesthesiology

## 2018-03-07 ENCOUNTER — Ambulatory Visit (HOSPITAL_COMMUNITY)
Admission: RE | Admit: 2018-03-07 | Discharge: 2018-03-07 | Disposition: A | Discharge status: Home / Self Care | Payer: 59 | Source: Ambulatory Visit | Attending: Orthopedic Surgery | Admitting: Orthopedic Surgery

## 2018-03-07 ENCOUNTER — Encounter (HOSPITAL_COMMUNITY)
Admission: RE | Disposition: A | Discharge status: Home / Self Care | Payer: Self-pay | Source: Ambulatory Visit | Attending: Orthopedic Surgery

## 2018-03-07 DIAGNOSIS — J45909 Unspecified asthma, uncomplicated: Secondary | ICD-10-CM | POA: Diagnosis not present

## 2018-03-07 DIAGNOSIS — G4733 Obstructive sleep apnea (adult) (pediatric): Secondary | ICD-10-CM | POA: Insufficient documentation

## 2018-03-07 DIAGNOSIS — X509XXA Other and unspecified overexertion or strenuous movements or postures, initial encounter: Secondary | ICD-10-CM | POA: Insufficient documentation

## 2018-03-07 DIAGNOSIS — M19011 Primary osteoarthritis, right shoulder: Secondary | ICD-10-CM | POA: Diagnosis not present

## 2018-03-07 DIAGNOSIS — I4510 Unspecified right bundle-branch block: Secondary | ICD-10-CM | POA: Diagnosis not present

## 2018-03-07 DIAGNOSIS — S46011A Strain of muscle(s) and tendon(s) of the rotator cuff of right shoulder, initial encounter: Secondary | ICD-10-CM | POA: Insufficient documentation

## 2018-03-07 DIAGNOSIS — E669 Obesity, unspecified: Secondary | ICD-10-CM | POA: Diagnosis not present

## 2018-03-07 DIAGNOSIS — M7581 Other shoulder lesions, right shoulder: Secondary | ICD-10-CM | POA: Diagnosis not present

## 2018-03-07 DIAGNOSIS — I10 Essential (primary) hypertension: Secondary | ICD-10-CM | POA: Diagnosis not present

## 2018-03-07 DIAGNOSIS — M75101 Unspecified rotator cuff tear or rupture of right shoulder, not specified as traumatic: Secondary | ICD-10-CM | POA: Diagnosis not present

## 2018-03-07 DIAGNOSIS — Z8249 Family history of ischemic heart disease and other diseases of the circulatory system: Secondary | ICD-10-CM | POA: Insufficient documentation

## 2018-03-07 DIAGNOSIS — S46011D Strain of muscle(s) and tendon(s) of the rotator cuff of right shoulder, subsequent encounter: Secondary | ICD-10-CM

## 2018-03-07 DIAGNOSIS — Z6836 Body mass index (BMI) 36.0-36.9, adult: Secondary | ICD-10-CM | POA: Insufficient documentation

## 2018-03-07 DIAGNOSIS — F17210 Nicotine dependence, cigarettes, uncomplicated: Secondary | ICD-10-CM | POA: Diagnosis not present

## 2018-03-07 DIAGNOSIS — F329 Major depressive disorder, single episode, unspecified: Secondary | ICD-10-CM | POA: Diagnosis not present

## 2018-03-07 HISTORY — PX: SHOULDER OPEN ROTATOR CUFF REPAIR: SHX2407

## 2018-03-07 SURGERY — REPAIR, ROTATOR CUFF, OPEN
Anesthesia: General | Site: Shoulder | Laterality: Right

## 2018-03-07 MED ORDER — CEFAZOLIN SODIUM-DEXTROSE 2-4 GM/100ML-% IV SOLN
2.0000 g | INTRAVENOUS | Status: AC
  Administered 2018-03-07: 2 g via INTRAVENOUS
  Filled 2018-03-07: qty 100

## 2018-03-07 MED ORDER — ONDANSETRON HCL 4 MG/2ML IJ SOLN
4.0000 mg | Freq: Once | INTRAMUSCULAR | Status: AC
  Administered 2018-03-07: 4 mg via INTRAVENOUS

## 2018-03-07 MED ORDER — MIDAZOLAM HCL 2 MG/2ML IJ SOLN
INTRAMUSCULAR | Status: AC
  Filled 2018-03-07: qty 2

## 2018-03-07 MED ORDER — FENTANYL CITRATE (PF) 100 MCG/2ML IJ SOLN
25.0000 ug | INTRAMUSCULAR | Status: AC
  Administered 2018-03-07 (×2): 25 ug via INTRAVENOUS

## 2018-03-07 MED ORDER — MEPERIDINE HCL 50 MG/ML IJ SOLN: 6.2500 mg | INTRAMUSCULAR | Status: DC | PRN

## 2018-03-07 MED ORDER — GLYCOPYRROLATE PF 0.2 MG/ML IJ SOSY
PREFILLED_SYRINGE | INTRAMUSCULAR | Status: AC
  Filled 2018-03-07: qty 1

## 2018-03-07 MED ORDER — SODIUM CHLORIDE (PF) 0.9 % IJ SOLN
INTRAMUSCULAR | Status: AC
  Filled 2018-03-07: qty 40

## 2018-03-07 MED ORDER — ONDANSETRON HCL 4 MG/2ML IJ SOLN
INTRAMUSCULAR | Status: DC | PRN
  Administered 2018-03-07: 4 mg via INTRAVENOUS

## 2018-03-07 MED ORDER — PROPOFOL 10 MG/ML IV BOLUS
INTRAVENOUS | Status: AC
  Filled 2018-03-07: qty 40

## 2018-03-07 MED ORDER — OXYCODONE-ACETAMINOPHEN 5-325 MG PO TABS: 1.0000 | ORAL_TABLET | ORAL | 0 refills | Status: DC | PRN

## 2018-03-07 MED ORDER — EPHEDRINE 5 MG/ML INJ
INTRAVENOUS | Status: AC
  Filled 2018-03-07: qty 10

## 2018-03-07 MED ORDER — ARTIFICIAL TEARS OPHTHALMIC OINT
TOPICAL_OINTMENT | OPHTHALMIC | Status: DC | PRN
  Administered 2018-03-07: 1 via OPHTHALMIC

## 2018-03-07 MED ORDER — ROCURONIUM BROMIDE 50 MG/5ML IV SOSY
PREFILLED_SYRINGE | INTRAVENOUS | Status: DC | PRN
  Administered 2018-03-07: 40 mg via INTRAVENOUS

## 2018-03-07 MED ORDER — PROMETHAZINE HCL 25 MG/ML IJ SOLN: 6.2500 mg | INTRAMUSCULAR | Status: DC | PRN

## 2018-03-07 MED ORDER — PROPOFOL 10 MG/ML IV BOLUS
INTRAVENOUS | Status: DC | PRN
  Administered 2018-03-07: 200 mg via INTRAVENOUS

## 2018-03-07 MED ORDER — FENTANYL CITRATE (PF) 250 MCG/5ML IJ SOLN
INTRAMUSCULAR | Status: AC
  Filled 2018-03-07: qty 5

## 2018-03-07 MED ORDER — BUPIVACAINE LIPOSOME 1.3 % IJ SUSP
INTRAMUSCULAR | Status: AC
  Filled 2018-03-07: qty 20

## 2018-03-07 MED ORDER — IBUPROFEN 800 MG PO TABS
ORAL_TABLET | ORAL | Status: AC
  Filled 2018-03-07: qty 1

## 2018-03-07 MED ORDER — PREGABALIN 50 MG PO CAPS
50.0000 mg | ORAL_CAPSULE | Freq: Once | ORAL | Status: AC
  Administered 2018-03-07: 50 mg via ORAL

## 2018-03-07 MED ORDER — SUGAMMADEX SODIUM 200 MG/2ML IV SOLN
INTRAVENOUS | Status: DC | PRN
  Administered 2018-03-07: 230.4 mg via INTRAVENOUS

## 2018-03-07 MED ORDER — ONDANSETRON HCL 4 MG/2ML IJ SOLN
INTRAMUSCULAR | Status: AC
  Filled 2018-03-07: qty 2

## 2018-03-07 MED ORDER — ROCURONIUM BROMIDE 10 MG/ML (PF) SYRINGE
PREFILLED_SYRINGE | INTRAVENOUS | Status: AC
  Filled 2018-03-07: qty 10

## 2018-03-07 MED ORDER — FENTANYL CITRATE (PF) 100 MCG/2ML IJ SOLN
INTRAMUSCULAR | Status: DC | PRN
  Administered 2018-03-07 (×10): 50 ug via INTRAVENOUS

## 2018-03-07 MED ORDER — OXYCODONE HCL 5 MG PO TABS
5.0000 mg | ORAL_TABLET | Freq: Once | ORAL | Status: AC
  Administered 2018-03-07: 5 mg via ORAL

## 2018-03-07 MED ORDER — EPHEDRINE SULFATE 50 MG/ML IJ SOLN
INTRAMUSCULAR | Status: DC | PRN
  Administered 2018-03-07: 10 mg via INTRAVENOUS

## 2018-03-07 MED ORDER — BUPIVACAINE-EPINEPHRINE (PF) 0.25% -1:200000 IJ SOLN
INTRAMUSCULAR | Status: DC | PRN
  Administered 2018-03-07 (×2): 30 mL via PERINEURAL

## 2018-03-07 MED ORDER — HYDROMORPHONE HCL 1 MG/ML IJ SOLN
0.2500 mg | INTRAMUSCULAR | Status: AC | PRN
  Administered 2018-03-07 (×8): 0.5 mg via INTRAVENOUS
  Filled 2018-03-07 (×5): qty 0.5

## 2018-03-07 MED ORDER — FENTANYL CITRATE (PF) 100 MCG/2ML IJ SOLN
INTRAMUSCULAR | Status: AC
  Filled 2018-03-07: qty 2

## 2018-03-07 MED ORDER — ROCURONIUM BROMIDE 100 MG/10ML IV SOLN
INTRAVENOUS | Status: DC | PRN
  Administered 2018-03-07: 20 mg via INTRAVENOUS
  Administered 2018-03-07: 10 mg via INTRAVENOUS
  Administered 2018-03-07: 20 mg via INTRAVENOUS

## 2018-03-07 MED ORDER — LACTATED RINGERS IV SOLN
INTRAVENOUS | Status: DC
  Administered 2018-03-07 (×2): via INTRAVENOUS

## 2018-03-07 MED ORDER — LIDOCAINE 2% (20 MG/ML) 5 ML SYRINGE
INTRAMUSCULAR | Status: DC | PRN
  Administered 2018-03-07: 80 mg via INTRAVENOUS

## 2018-03-07 MED ORDER — 0.9 % SODIUM CHLORIDE (POUR BTL) OPTIME
TOPICAL | Status: DC | PRN
  Administered 2018-03-07: 1000 mL

## 2018-03-07 MED ORDER — IBUPROFEN 800 MG PO TABS: 800.0000 mg | ORAL_TABLET | Freq: Three times a day (TID) | ORAL | 1 refills | Status: DC | PRN

## 2018-03-07 MED ORDER — ALBUTEROL SULFATE HFA 108 (90 BASE) MCG/ACT IN AERS
INHALATION_SPRAY | RESPIRATORY_TRACT | Status: DC | PRN
  Administered 2018-03-07: 5 via RESPIRATORY_TRACT

## 2018-03-07 MED ORDER — TIZANIDINE HCL 4 MG PO TABS: 4.0000 mg | ORAL_TABLET | Freq: Four times a day (QID) | ORAL | 1 refills | Status: DC | PRN

## 2018-03-07 MED ORDER — MIDAZOLAM HCL 5 MG/5ML IJ SOLN
INTRAMUSCULAR | Status: DC | PRN
  Administered 2018-03-07: 2 mg via INTRAVENOUS

## 2018-03-07 MED ORDER — PREGABALIN 50 MG PO CAPS
ORAL_CAPSULE | ORAL | Status: AC
  Filled 2018-03-07: qty 1

## 2018-03-07 MED ORDER — IBUPROFEN 800 MG PO TABS
800.0000 mg | ORAL_TABLET | Freq: Once | ORAL | Status: AC
  Administered 2018-03-07: 800 mg via ORAL

## 2018-03-07 MED ORDER — ARTIFICIAL TEARS OPHTHALMIC OINT
TOPICAL_OINTMENT | OPHTHALMIC | Status: AC
  Filled 2018-03-07: qty 3.5

## 2018-03-07 MED ORDER — PROMETHAZINE HCL 12.5 MG PO TABS: 12.5000 mg | ORAL_TABLET | Freq: Four times a day (QID) | ORAL | 0 refills | Status: DC | PRN

## 2018-03-07 MED ORDER — LACTATED RINGERS IV SOLN: INTRAVENOUS | Status: DC

## 2018-03-07 MED ORDER — METHOCARBAMOL 1000 MG/10ML IJ SOLN
500.0000 mg | Freq: Once | INTRAVENOUS | Status: AC
  Administered 2018-03-07: 500 mg via INTRAVENOUS
  Filled 2018-03-07: qty 5

## 2018-03-07 MED ORDER — BUPIVACAINE-EPINEPHRINE (PF) 0.25% -1:200000 IJ SOLN
INTRAMUSCULAR | Status: AC
  Filled 2018-03-07: qty 60

## 2018-03-07 MED ORDER — SUCCINYLCHOLINE CHLORIDE 20 MG/ML IJ SOLN
INTRAMUSCULAR | Status: DC | PRN
  Administered 2018-03-07: 180 mg via INTRAVENOUS

## 2018-03-07 MED ORDER — LIDOCAINE 2% (20 MG/ML) 5 ML SYRINGE
INTRAMUSCULAR | Status: AC
  Filled 2018-03-07: qty 5

## 2018-03-07 MED ORDER — SUCCINYLCHOLINE CHLORIDE 200 MG/10ML IV SOSY
PREFILLED_SYRINGE | INTRAVENOUS | Status: AC
  Filled 2018-03-07: qty 10

## 2018-03-07 MED ORDER — GLYCOPYRROLATE PF 0.2 MG/ML IJ SOSY
PREFILLED_SYRINGE | INTRAMUSCULAR | Status: DC | PRN
  Administered 2018-03-07: .2 mg via INTRAVENOUS

## 2018-03-07 MED ORDER — OXYCODONE HCL 5 MG PO TABS
ORAL_TABLET | ORAL | Status: AC
  Filled 2018-03-07: qty 1

## 2018-03-07 MED ORDER — HYDROMORPHONE HCL 1 MG/ML IJ SOLN
INTRAMUSCULAR | Status: AC
  Filled 2018-03-07: qty 1

## 2018-03-07 MED ORDER — HYDROMORPHONE HCL 1 MG/ML IJ SOLN
INTRAMUSCULAR | Status: AC
  Filled 2018-03-07: qty 0.5

## 2018-03-07 MED ORDER — CHLORHEXIDINE GLUCONATE 4 % EX LIQD: 60.0000 mL | Freq: Once | CUTANEOUS | Status: DC

## 2018-03-07 SURGICAL SUPPLY — 51 items
BLADE HEX COATED 2.75 (ELECTRODE) ×3 IMPLANT
BLADE OSC/SAGITTAL MD 9X18.5 (BLADE) ×3 IMPLANT
CHLORAPREP W/TINT 26ML (MISCELLANEOUS) ×3 IMPLANT
CLOTH BEACON ORANGE TIMEOUT ST (SAFETY) ×3 IMPLANT
COVER LIGHT HANDLE STERIS (MISCELLANEOUS) ×6 IMPLANT
DRAPE ORTHO 2.5IN SPLIT 77X108 (DRAPES) IMPLANT
DRAPE ORTHO SPLIT 77X108 STRL (DRAPES) ×6
DRAPE PROXIMA HALF (DRAPES) ×3 IMPLANT
DRESSING MEPILEX BORDER 6X8 (GAUZE/BANDAGES/DRESSINGS) IMPLANT
DRSG MEPILEX BORDER 6X8 (GAUZE/BANDAGES/DRESSINGS) ×3
ELECT REM PT RETURN 9FT ADLT (ELECTROSURGICAL) ×3
ELECTRODE REM PT RTRN 9FT ADLT (ELECTROSURGICAL) ×1 IMPLANT
GLOVE BIO SURGEON STRL SZ7 (GLOVE) ×2 IMPLANT
GLOVE BIOGEL PI IND STRL 7.0 (GLOVE) ×1 IMPLANT
GLOVE BIOGEL PI INDICATOR 7.0 (GLOVE) ×6
GLOVE ECLIPSE 6.5 STRL STRAW (GLOVE) ×2 IMPLANT
GLOVE SKINSENSE NS SZ8.0 LF (GLOVE) ×2
GLOVE SKINSENSE STRL SZ8.0 LF (GLOVE) ×1 IMPLANT
GLOVE SS N UNI LF 8.5 STRL (GLOVE) ×3 IMPLANT
GOWN STRL REUS W/TWL LRG LVL3 (GOWN DISPOSABLE) ×6 IMPLANT
GOWN STRL REUS W/TWL XL LVL3 (GOWN DISPOSABLE) ×3 IMPLANT
IMP SYSTEM BRIDGE 4.75X19.1 (Anchor) ×3 IMPLANT
IMPL SYSTEM BRIDGE 4.75X19.1 (Anchor) IMPLANT
INST SET MINOR BONE (KITS) ×3 IMPLANT
KIT BLADEGUARD II DBL (SET/KITS/TRAYS/PACK) ×3 IMPLANT
KIT TURNOVER KIT A (KITS) ×3 IMPLANT
MANIFOLD NEPTUNE II (INSTRUMENTS) ×3 IMPLANT
MARKER SKIN DUAL TIP RULER LAB (MISCELLANEOUS) ×3 IMPLANT
NDL MAYO 6 CRC TAPER PT (NEEDLE) IMPLANT
NEEDLE HYPO 21X1.5 SAFETY (NEEDLE) ×3 IMPLANT
NEEDLE MAYO 6 CRC TAPER PT (NEEDLE) ×3 IMPLANT
NS IRRIG 1000ML POUR BTL (IV SOLUTION) ×3 IMPLANT
PACK TOTAL JOINT (CUSTOM PROCEDURE TRAY) ×3 IMPLANT
PAD ARMBOARD 7.5X6 YLW CONV (MISCELLANEOUS) ×3 IMPLANT
PASSER SUT CAPTURE FIRST (SUTURE) IMPLANT
PASSER SUT SWANSON 36MM LOOP (INSTRUMENTS) ×3 IMPLANT
RASP SM TEAR CROSS CUT (RASP) ×2 IMPLANT
SET BASIN LINEN APH (SET/KITS/TRAYS/PACK) ×3 IMPLANT
SLING ARM IMMOBILIZER MED (SOFTGOODS) ×2 IMPLANT
STAPLER VISISTAT 35W (STAPLE) ×2 IMPLANT
SUT BONE WAX W31G (SUTURE) IMPLANT
SUT ETHIBOND NAB OS 4 #2 30IN (SUTURE) ×21 IMPLANT
SUT ETHILON 3 0 FSL (SUTURE) IMPLANT
SUT MON AB 0 CT1 (SUTURE) ×3 IMPLANT
SUT MON AB 2-0 CT1 36 (SUTURE) ×3 IMPLANT
SUT PROLENE 3 0 PS 1 (SUTURE) IMPLANT
SYR 30ML LL (SYRINGE) ×3 IMPLANT
SYR BULB IRRIGATION 50ML (SYRINGE) ×3 IMPLANT
TISSUE ARTHOFLEX  1.26-1.75MM (Tissue) ×2 IMPLANT
TISSUE ARTHOFLEX 1.26-1.75MM (Tissue) ×1 IMPLANT
YANKAUER SUCT 12FT TUBE ARGYLE (SUCTIONS) ×3 IMPLANT

## 2018-03-07 NOTE — Brief Op Note (Signed)
03/07/2018  10:12 AM  PATIENT:  Ryan Hickman  57 y.o. 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  PROCEDURE:  Procedure(s): ROTATOR CUFF REPAIR SHOULDER OPEN with distal clavicle excison superior capsular reconstruction (Right)   Operative findings  Large rotator cuff tear with 3 cm retraction  Implants Arthrex suture anchors to medially and then the lateral row of 2 additional anchors plus augmentation graft  SURGEON:  Surgeon(s) and Role:    Carole Civil, MD - Primary  PHYSICIAN ASSISTANT:   ASSISTANTS: betty ashley    ANESTHESIA:   general  EBL:  15 mL   BLOOD ADMINISTERED:none  DRAINS: none   LOCAL MEDICATIONS USED:  MARCAINE     SPECIMEN:  No Specimen  DISPOSITION OF SPECIMEN:  N/A  COUNTS:  YES  TOURNIQUET:  * No tourniquets in log *  DICTATION: .Dragon Dictation  PLAN OF CARE: Discharge to home after PACU  PATIENT DISPOSITION:  PACU - hemodynamically stable.   Delay start of Pharmacological VTE agent (>24hrs) due to surgical blood loss or risk of bleeding: not applicable

## 2018-03-07 NOTE — Discharge Instructions (Signed)
General Anesthesia, Adult, Care After °These instructions provide you with information about caring for yourself after your procedure. Your health care provider may also give you more specific instructions. Your treatment has been planned according to current medical practices, but problems sometimes occur. Call your health care provider if you have any problems or questions after your procedure. °What can I expect after the procedure? °After the procedure, it is common to have: °· Vomiting. °· A sore throat. °· Mental slowness. ° °It is common to feel: °· Nauseous. °· Cold or shivery. °· Sleepy. °· Tired. °· Sore or achy, even in parts of your body where you did not have surgery. ° °Follow these instructions at home: °For at least 24 hours after the procedure: °· Do not: °? Participate in activities where you could fall or become injured. °? Drive. °? Use heavy machinery. °? Drink alcohol. °? Take sleeping pills or medicines that cause drowsiness. °? Make important decisions or sign legal documents. °? Take care of children on your own. °· Rest. °Eating and drinking °· If you vomit, drink water, juice, or soup when you can drink without vomiting. °· Drink enough fluid to keep your urine clear or pale yellow. °· Make sure you have little or no nausea before eating solid foods. °· Follow the diet recommended by your health care provider. °General instructions °· Have a responsible adult stay with you until you are awake and alert. °· Return to your normal activities as told by your health care provider. Ask your health care provider what activities are safe for you. °· Take over-the-counter and prescription medicines only as told by your health care provider. °· If you smoke, do not smoke without supervision. °· Keep all follow-up visits as told by your health care provider. This is important. °Contact a health care provider if: °· You continue to have nausea or vomiting at home, and medicines are not helpful. °· You  cannot drink fluids or start eating again. °· You cannot urinate after 8-12 hours. °· You develop a skin rash. °· You have fever. °· You have increasing redness at the site of your procedure. °Get help right away if: °· You have difficulty breathing. °· You have chest pain. °· You have unexpected bleeding. °· You feel that you are having a life-threatening or urgent problem. °This information is not intended to replace advice given to you by your health care provider. Make sure you discuss any questions you have with your health care provider. °Document Released: 07/17/2000 Document Revised: 09/13/2015 Document Reviewed: 03/25/2015 °Elsevier Interactive Patient Education © 2018 [employer] ° °

## 2018-03-07 NOTE — Anesthesia Preprocedure Evaluation (Signed)
Anesthesia Evaluation    Airway Mallampati: III       Dental  (+) Teeth Intact, Dental Advidsory Given   Pulmonary asthma , sleep apnea , Current Smoker,     + decreased breath sounds      Cardiovascular hypertension, On Medications  Rhythm:regular     Neuro/Psych PSYCHIATRIC DISORDERS Depression    GI/Hepatic GERD  Medicated,  Endo/Other    Renal/GU      Musculoskeletal   Abdominal   Peds  Hematology   Anesthesia Other Findings NSR with RBBB Obesity w/ OSA, asthma, ongoing tob abuse, COPD Prominent upper teeth  Reproductive/Obstetrics                             Anesthesia Physical Anesthesia Plan  ASA: III  Anesthesia Plan: General   Post-op Pain Management:    Induction:   PONV Risk Score and Plan:   Airway Management Planned:   Additional Equipment:   Intra-op Plan:   Post-operative Plan:   Informed Consent:   Plan Discussed with: Anesthesiologist  Anesthesia Plan Comments:         Anesthesia Quick Evaluation

## 2018-03-07 NOTE — Transfer of Care (Signed)
Immediate Anesthesia Transfer of Care Note  Patient: Ryan Hickman  Procedure(s) Performed: ROTATOR CUFF REPAIR SHOULDER OPEN with distal clavicle excison superior capsular reconstruction (Right Shoulder)  Patient Location: PACU  Anesthesia Type:General  Level of Consciousness: awake, alert , oriented and patient cooperative  Airway & Oxygen Therapy: Patient Spontanous Breathing  Post-op Assessment: Report given to RN and Post -op Vital signs reviewed and stable  Post vital signs: Reviewed and stable  Last Vitals:  Vitals Value Taken Time  BP 110/61 03/07/2018 10:16 AM  Temp    Pulse 77 03/07/2018 10:18 AM  Resp 14 03/07/2018 10:18 AM  SpO2 93 % 03/07/2018 10:18 AM  Vitals shown include unvalidated device data.  Last Pain:  Vitals:   03/07/18 0648  TempSrc: Oral  PainSc: 2       Patients Stated Pain Goal: 5 (82/64/15 8309)  Complications: No apparent anesthesia complications

## 2018-03-07 NOTE — Anesthesia Procedure Notes (Signed)
Procedure Name: Intubation Date/Time: 03/07/2018 7:35 AM Performed by: Andree Elk, Dream Harman A, CRNA Pre-anesthesia Checklist: Patient identified, Patient being monitored, Timeout performed, Emergency Drugs available and Suction available Patient Re-evaluated:Patient Re-evaluated prior to induction Oxygen Delivery Method: Circle system utilized Preoxygenation: Pre-oxygenation with 100% oxygen Induction Type: IV induction Laryngoscope Size: 3 and Glidescope Grade View: Grade I Tube type: Oral Tube size: 7.0 mm Number of attempts: 1 Airway Equipment and Method: Stylet Placement Confirmation: ETT inserted through vocal cords under direct vision,  positive ETCO2 and breath sounds checked- equal and bilateral Secured at: 21 cm Tube secured with: Tape Dental Injury: Teeth and Oropharynx as per pre-operative assessment

## 2018-03-07 NOTE — Anesthesia Postprocedure Evaluation (Signed)
Anesthesia Post Note  Patient: Ryan Hickman  Procedure(s) Performed: ROTATOR CUFF REPAIR SHOULDER OPEN with distal clavicle excison superior capsular reconstruction (Right Shoulder)  Patient location during evaluation: PACU Anesthesia Type: General Level of consciousness: awake and alert and oriented Pain management: pain level controlled Vital Signs Assessment: post-procedure vital signs reviewed and stable Respiratory status: spontaneous breathing Cardiovascular status: stable Postop Assessment: no apparent nausea or vomiting Anesthetic complications: no     Last Vitals:  Vitals:   03/07/18 1045 03/07/18 1100  BP: 108/71 108/65  Pulse: 73 81  Resp: 19 16  Temp:    SpO2: 95% 96%    Last Pain:  Vitals:   03/07/18 1100  TempSrc:   PainSc: 8                  ADAMS, AMY A

## 2018-03-07 NOTE — Interval H&P Note (Signed)
History and Physical Interval Note:  03/07/2018 7:22 AM  Ryan Hickman  has presented today for surgery, with the diagnosis of torn rotator cuff and acromial clavicluar arthritis of right shoulder  The various methods of treatment have been discussed with the patient and family. After consideration of risks, benefits and other options for treatment, the patient has consented to  Procedure(s): ROTATOR CUFF REPAIR SHOULDER OPEN with distal clavicle excison superior capsular reconstruction (Right) as a surgical intervention .  The patient's history has been reviewed, patient examined, no change in status, stable for surgery.  I have reviewed the patient's chart and labs.  Questions were answered to the patient's satisfaction.     Arther Abbott

## 2018-03-07 NOTE — Op Note (Addendum)
03/07/2018  10:12 AM  PATIENT:  Gwendlyn Deutscher  57 y.o. 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 Versie was seen in preop.  Surgical site confirmed chart review completed surgical site marked right shoulder.  Patient taken to surgery general anesthesia given.  2 g of Ancef given.  Patient then placed in modified beachchair position sandbag under right shoulder  Patient prepped and draped sterilely.  Timeout completed.  Quarter percent Marcaine with epinephrine injected into the proposed suture line.  A longitudinal incision was made starting at the acromioclavicular joint and crossing the anterior and middle third of the deltoid.  The incision was taken down to the deltoid fascia which was split in line with the skin incision up to the acromion.  Subperiosteal dissection of the deltoid fascia and trapezius fascia was done to create medial lateral flaps for later repair.  [facility] joint was resected 8 mm using a saw and then contoured with a rasp.  Acromioplasty was performed by removing the anterior 3 to 4 mm which was spur and then removing the inferior 25% of acromion to make a type I acromion.  This is followed by irrigation.  Bursectomy exposed the rotator cuff tear humeral head defect as well.  The tear was retracted approximately 3 cm  Control of the tear was performed by passing 2, #2 Ethibond  sutures.  Internally rotating the arm and superior and inferior releases were performed to gain lateral excursion of the tendon.  I also did an interval slide between the supraspinatus and infraspinatus up to the coracoid.  I was able to get the tendon over to the articular margin +1 cm laterally.  I then passed 2 swivel lock anchors per manufacture technique and passed the sutures through them.  I then anchored them down to bone with 2 Additional Lateral Row sutures and then did a side-to-side repair anteriorly side to side to prepare posteriorly with #2 Ethibond suture in interrupted fashion in inverted fashion  I then placed a graft over the entire defect and secured that with 2 sutures from the lateral row and then #2 Ethibond in running fashion anteriorly and posteriorly.  I trimmed excessive graft.  We got a very nice  watertight repair without tension.  Thorough wound irrigation.  Thorough irrigation was performed the deltoid trapezial fascia and deltoid was closed with separate running #2 Ethibond suture  Wound was irrigated again followed by a running 0 Monocryl suture and staples.  We injected 45 cc of Marcaine with epinephrine for total of 60 cc  Sterile dressing and shoulder immobilizer applied  Patient extubated taken recovery room in stable condition  PHYSICIAN ASSISTANT:   ASSISTANTS: betty ashley    ANESTHESIA:   general  EBL:  15 mL   BLOOD ADMINISTERED:none  DRAINS: none   LOCAL MEDICATIONS USED:  MARCAINE     SPECIMEN:  No Specimen  DISPOSITION OF SPECIMEN:  N/A  COUNTS:  YES  TOURNIQUET:  * No tourniquets in log *  DICTATION: .Dragon Dictation  PLAN OF CARE: Discharge to home after PACU  PATIENT DISPOSITION:  PACU - hemodynamically stable.   Delay start of Pharmacological VTE agent (>24hrs) due to surgical blood loss or risk of bleeding: not applicable

## 2018-03-08 ENCOUNTER — Encounter (HOSPITAL_COMMUNITY): Payer: Self-pay | Admitting: Orthopedic Surgery

## 2018-03-08 ENCOUNTER — Telehealth: Payer: Self-pay | Admitting: Orthopedic Surgery

## 2018-03-08 DIAGNOSIS — Z9889 Other specified postprocedural states: Secondary | ICD-10-CM | POA: Insufficient documentation

## 2018-03-08 NOTE — Telephone Encounter (Addendum)
Patient called wanting to speak with a nurse. He had RTCR surgery yesterday 03/07/18. States that it bled some last night, hand and fingers are swollen and his pain is real bad(per pt) and the pain medication is not really helping.  Please call and advise.  [phone]

## 2018-03-08 NOTE — Telephone Encounter (Signed)
DOUBLE HIS PAIN PILL SEE MON INSTEAD OF Friday

## 2018-03-08 NOTE — Telephone Encounter (Signed)
I called him  Double the oxycodone Use ice  he wants better fitting sling, sent order to the apothecary, his wife will pickup  We will see him on Monday

## 2018-03-11 ENCOUNTER — Ambulatory Visit (INDEPENDENT_AMBULATORY_CARE_PROVIDER_SITE_OTHER): Payer: 59 | Admitting: Orthopedic Surgery

## 2018-03-11 ENCOUNTER — Encounter: Payer: Self-pay | Admitting: Orthopedic Surgery

## 2018-03-11 VITALS — BP 180/94 | HR 80 | Ht 70.0 in | Wt 262.0 lb

## 2018-03-11 DIAGNOSIS — Z9889 Other specified postprocedural states: Secondary | ICD-10-CM

## 2018-03-11 MED ORDER — OXYCODONE-ACETAMINOPHEN 10-325 MG PO TABS: 1.0000 | ORAL_TABLET | ORAL | 0 refills | Status: AC | PRN

## 2018-03-11 NOTE — Progress Notes (Signed)
No chief complaint on file.   Post rotator cuff repair right shoulder with aflex graft,  Complained of severe pain on Friday he was advised to take 2 tablets and says his pain is bearable today  I changed his dressing his wounds look great  I placed him in a sling shot  Follow-up on the 27th to get the staples out  Postop plan is for 6 weeks of no motion

## 2018-03-15 ENCOUNTER — Ambulatory Visit: Payer: 59 | Admitting: Orthopedic Surgery

## 2018-03-20 ENCOUNTER — Ambulatory Visit (INDEPENDENT_AMBULATORY_CARE_PROVIDER_SITE_OTHER): Payer: 59 | Admitting: Orthopedic Surgery

## 2018-03-20 ENCOUNTER — Encounter: Payer: Self-pay | Admitting: Orthopedic Surgery

## 2018-03-20 VITALS — BP 185/105 | HR 97 | Ht 70.0 in | Wt 262.0 lb

## 2018-03-20 DIAGNOSIS — Z9889 Other specified postprocedural states: Secondary | ICD-10-CM

## 2018-03-20 MED ORDER — HYDROCODONE-ACETAMINOPHEN 10-325 MG PO TABS: 1.0000 | ORAL_TABLET | Freq: Four times a day (QID) | ORAL | 0 refills | Status: AC | PRN

## 2018-03-20 NOTE — Patient Instructions (Signed)
TAKE IT EASY WEAR SLING

## 2018-03-20 NOTE — Progress Notes (Signed)
POSTOP VISIT  POD # 13  Chief Complaint  Patient presents with  . Routine Post Op    03/07/18 Right RCR     03/07/2018  10:12 AM  PATIENT:  Ryan Hickman  57 y.o. 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  . S/P right rotator cuff repair 03/07/18 Yes    The knee is doing well his pain is under much better control he would like a prescription for hydrocodone instead of oxycodone, remembering that he has a chronic history of opioid use not abuse  His incision looks good his arm is not swelling his hand is not swollen  Postoperative plan (Work, [employer],  Meds ordered this encounter  Medications  . HYDROcodone-acetaminophen (NORCO) 10-325 MG tablet    Sig: Take 1 tablet by mouth every 6 (six) hours as needed for up to 7 days.    Dispense:  28 tablet    Refill:  0  ,FU)  He will be in the sling for 6 weeks total, he has a follow-up in 4 weeks  At 4 weeks he can start passive range of motion which will go from weeks 7 through 12

## 2018-03-28 DIAGNOSIS — E6609 Other obesity due to excess calories: Secondary | ICD-10-CM | POA: Diagnosis not present

## 2018-03-28 DIAGNOSIS — Z6836 Body mass index (BMI) 36.0-36.9, adult: Secondary | ICD-10-CM | POA: Diagnosis not present

## 2018-03-28 DIAGNOSIS — J019 Acute sinusitis, unspecified: Secondary | ICD-10-CM | POA: Diagnosis not present

## 2018-04-03 ENCOUNTER — Other Ambulatory Visit: Payer: Self-pay | Admitting: Orthopedic Surgery

## 2018-04-03 MED ORDER — HYDROCODONE-ACETAMINOPHEN 10-325 MG PO TABS: 1.0000 | ORAL_TABLET | Freq: Four times a day (QID) | ORAL | 0 refills | Status: DC | PRN

## 2018-04-03 NOTE — Telephone Encounter (Signed)
Patient called to request refill for Hydrocodone - said it is 10mg  ?)  (not finding pain medication on current medication list) [facility], [place]na [place]

## 2018-04-12 ENCOUNTER — Encounter: Payer: Self-pay | Admitting: Orthopedic Surgery

## 2018-04-12 ENCOUNTER — Ambulatory Visit (INDEPENDENT_AMBULATORY_CARE_PROVIDER_SITE_OTHER): Payer: 59 | Admitting: Orthopedic Surgery

## 2018-04-12 VITALS — BP 173/100 | HR 64 | Ht 70.0 in | Wt 255.0 lb

## 2018-04-12 DIAGNOSIS — Z9889 Other specified postprocedural states: Secondary | ICD-10-CM

## 2018-04-12 MED ORDER — HYDROCODONE-ACETAMINOPHEN 7.5-325 MG PO TABS: 1.0000 | ORAL_TABLET | ORAL | 0 refills | Status: AC | PRN

## 2018-04-12 NOTE — Patient Instructions (Signed)
NO LIFTING OVER 5 LBS RT ARM   START THERAPY

## 2018-04-12 NOTE — Progress Notes (Signed)
GLOBAL PERIOD POST OP [facility]   POD # 36  Encounter Diagnosis  Name Primary?  . S/P right rotator cuff repair 03/07/18 Yes    03/07/2018  10:12 AM  PATIENT:  Ryan Hickman  57 y.o. 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  . S/P right rotator cuff repair 03/07/18 Yes   Meds ordered this encounter  Medications  . HYDROcodone-acetaminophen (NORCO) 7.5-325 MG tablet    Sig: Take 1 tablet by mouth every 4 (four) hours as needed for up to 7 days for moderate pain.    Dispense:  42 tablet    Refill:  0

## 2018-04-18 ENCOUNTER — Encounter (HOSPITAL_COMMUNITY): Payer: Self-pay | Admitting: Occupational Therapy

## 2018-04-18 ENCOUNTER — Other Ambulatory Visit: Payer: Self-pay

## 2018-04-18 ENCOUNTER — Ambulatory Visit (HOSPITAL_COMMUNITY): Payer: 59 | Attending: Orthopedic Surgery | Admitting: Occupational Therapy

## 2018-04-18 DIAGNOSIS — M25611 Stiffness of right shoulder, not elsewhere classified: Secondary | ICD-10-CM | POA: Diagnosis not present

## 2018-04-18 DIAGNOSIS — M25511 Pain in right shoulder: Secondary | ICD-10-CM | POA: Diagnosis not present

## 2018-04-18 DIAGNOSIS — R29898 Other symptoms and signs involving the musculoskeletal system: Secondary | ICD-10-CM | POA: Diagnosis not present

## 2018-04-18 NOTE — Therapy (Signed)
[place], [place], [address] Phone: [phone]   Fax:  [phone]  Occupational Therapy Evaluation  Patient Details  Name: Ryan Hickman MRN: [id number] Date of Birth: Aug 06, 1960 Referring Provider (OT): Dr. Arther Abbott   Encounter Date: 04/18/2018  OT End of Session - 04/18/18 1634    Visit Number  1    Number of Visits  16    Date for OT Re-Evaluation  06/17/18   mini-reassessment 05/16/2018   Authorization Type  December 2019: [place]; [place] in Jan 2020-will need to call again for benefits    Authorization Time Period  60 visit limit with [place] - Visit Number  1    Authorization - Number of Visits  60    OT Start Time  1545    OT Stop Time  1625    OT Time Calculation (min)  40 min    Activity Tolerance  Patient tolerated treatment well    Behavior During Therapy  [facility] for tasks assessed/performed       Past Medical History:  Diagnosis Date  . Acid reflux   . Arthritis   . Childhood asthma    as child  . Depression   . History of ETOH abuse   . Hyperlipidemia   . Hypertension   . Insomnia   . Sleep apnea    uses CPAP, cannot tolerate  . Urticaria     Past Surgical History:  Procedure Laterality Date  . COLONOSCOPY  May 2010   Dr. Oneida Alar: simple adenoma, normal colon. Repeat 2020  . EAR CYST EXCISION N/A 11/14/2013   Procedure: EXCISION SEBACEOUS CYST CHEST WALL;  Surgeon: Jamesetta So, MD;  Location: [facility];  Service: General;  Laterality: N/A;  . KNEE ARTHROSCOPY WITH MEDIAL MENISECTOMY Left 08/24/2016   Procedure: KNEE ARTHROSCOPY WITH MEDIAL MENISECTOMY;  Surgeon: Carole Civil, MD;  Location: [facility];  Service: Orthopedics;  Laterality: Left;  . KNEE SURGERY Right 1992  . SHOULDER OPEN ROTATOR CUFF REPAIR Right 03/07/2018   Procedure: ROTATOR CUFF REPAIR SHOULDER OPEN with distal clavicle excison superior capsular reconstruction;  Surgeon: Carole Civil, MD;   Location: [facility];  Service: Orthopedics;  Laterality: Right;  . UPPER GASTROINTESTINAL ENDOSCOPY  May 2010   Dr. Oneida Alar: normal    There were no vitals filed for this visit.  Subjective Assessment - 04/18/18 1632    Subjective   S: I've been using it a little bit.     Pertinent History  Pt is a 57 y/o male s/p right open RC repair on 03/07/18 presenting for evaluation. Pt reports MD released him from his sling on Friday with instructions for no lifting over 5# and no lifting above 90 degrees. Pt was referred to occupational therapy for evaluation and treatment by Dr. Arther Abbott.     Special Tests  FOTO: 55/100    Patient Stated Goals  To improve my arm functioning and return to work.     Currently in Pain?  No/denies        [facility] OT Assessment - 04/18/18 1538      Assessment   Medical Diagnosis  s/p right RC repair    Referring Provider (OT)  Dr. Arther Abbott    Onset Date/Surgical Date  03/05/18    Hand Dominance  Right    Next MD Visit  05/27/2018    Prior Therapy  None      Precautions  Precautions  Shoulder    Type of Shoulder Precautions  Sitting/standing: No lifting arm past 90 degrees. Progress as tolerated in supine. No lifting over 5#.       Balance Screen   Has the patient fallen in the past 6 months  No    Has the patient had a decrease in activity level because of a fear of falling?   No    Is the patient reluctant to leave their home because of a fear of falling?   No      Prior Function   Level of Independence  Independent    Vocation  Full time employment    Vocation Requirements  automechanic-lifting, reaching, pulling, pushing    Leisure  fishing, outdoor work      ADL   ADL comments  Pt is having difficulty with reaching overhead, behind the back, sleeping, performing ADLs such as dressing and bathing.       Observation/Other Assessments   Focus on Therapeutic Outcomes (FOTO)   55/100      ROM / Strength   AROM / PROM / Strength   AROM;PROM;Strength      Palpation   Palpation comment  Moderate fascial restrictions at anterior shoulder, trapezius, and scapularis regions      AROM   Overall AROM   --    Overall AROM Comments  Assessed seated, er/IR adducted    AROM Assessment Site  Shoulder    Right/Left Shoulder  Right    Right Shoulder Flexion  126 Degrees    Right Shoulder ABduction  136 Degrees    Right Shoulder Internal Rotation  90 Degrees    Right Shoulder External Rotation  70 Degrees      PROM   Overall PROM Comments  Assessed supine, er/IR adducted    PROM Assessment Site  Shoulder    Right/Left Shoulder  Right    Right Shoulder Flexion  148 Degrees    Right Shoulder ABduction  180 Degrees    Right Shoulder Internal Rotation  90 Degrees    Right Shoulder External Rotation  40 Degrees      Strength   Overall Strength  --    Overall Strength Comments  Assessed seated, er/IR adducted    Strength Assessment Site  Shoulder    Right/Left Shoulder  Right    Right Shoulder Flexion  4-/5    Right Shoulder ABduction  3/5    Right Shoulder Internal Rotation  4+/5    Right Shoulder External Rotation  4/5                      OT Education - 04/18/18 1615    Education Details  scapular A/ROM, table slides-flexion and abduction    Person(s) Educated  Patient    Methods  Explanation;Demonstration;Handout    Comprehension  Verbalized understanding;Returned demonstration       OT Short Term Goals - 04/18/18 1638      OT SHORT TERM GOAL #1   Title  Pt will be provided with and educated on HEP to improve functional use of RUE as dominant during ADL completion.     Time  4    Period  Weeks    Status  New    Target Date  05/18/18      OT SHORT TERM GOAL #2   Title  Pt will decrease pain in RUE to 4/10  to improve ability to sleep at night for 5 consecutive hours or  greater.     Time  4    Period  Weeks    Status  New      OT SHORT TERM GOAL #3   Title  Pt will improve RUE A/ROM to  [facility] to improve ability to use RUE as assist during dressing tasks.     Time  4    Period  Weeks    Status  New        OT Long Term Goals - 04/18/18 1640      OT LONG TERM GOAL #1   Title  Pt will return to highest level of functioning using RUE as dominant during all daily and work tasks.     Time  8    Period  Weeks    Status  New    Target Date  06/17/18      OT LONG TERM GOAL #2   Title  Pt will decrease pain to 2/10 or less in RUE to improve mobility required for ADL task completion.     Time  8    Period  Weeks    Status  New      OT LONG TERM GOAL #3   Title  Pt will improve RUE A/ROM to WNL to improve mobility required for reaching overhead.     Time  8    Period  Weeks    Status  New      OT LONG TERM GOAL #4   Title  Pt will decrease fascial restrictions in RUE to minimal amounts or less to improve flexibility required for functional reaching tasks.     Time  8    Period  Weeks    Status  New      OT LONG TERM GOAL #5   Title  Pt will improve strength in RUE to 5/5 to increase ability to perform work tasks including overhead lifting.     Time  8    Period  Weeks    Status  New            Plan - 04/18/18 1635    Clinical Impression Statement  A: Pt is a 57 y/o male s/p right open RC repair on 03/07/18 presenting with decreased functional use of RUE as dominant during ADL completion. Pt is gently using the arm during daily tasks within limits set by MD.     Occupational Profile and client history currently impacting functional performance  Pt is independent and motivated to return to highest level of functioning as well as returning to work.     Occupational performance deficits (Please refer to evaluation for details):  ADL's;IADL's;Rest and Sleep;Work    Rehab Potential  Good    OT Frequency  2x / week    OT Duration  8 weeks    OT Treatment/Interventions  Self-care/ADL training;Moist Heat;Therapeutic activities;Ultrasound;Therapeutic  exercise;Cryotherapy;Passive range of motion;Electrical Stimulation;Manual Therapy;Patient/family education    Plan  P: Pt will benefit from skilled OT services to decrease pain and fascial restrictions, increase ROM, strength, and functional use of RUE during ADL completion. Treatment plan: myofascial release, manual therapy, P/ROM, AA/ROM, A/ROM, general shoulder and scapular stability and strengthening, modalities prn    Clinical Decision Making  Limited treatment options, no task modification necessary    Consulted and Agree with Plan of Care  Patient       Patient will benefit from skilled therapeutic intervention in order to improve the following deficits and impairments:  Decreased range of motion,  Impaired flexibility, Decreased activity tolerance, Increased fascial restrictions, Impaired UE functional use, Pain, Decreased strength  Visit Diagnosis: Acute pain of right shoulder  Stiffness of right shoulder, not elsewhere classified  Other symptoms and signs involving the musculoskeletal system    Problem List Patient Active Problem List   Diagnosis Date Noted  . S/P right rotator cuff repair 03/07/18 03/08/2018  . Traumatic complete tear of right rotator cuff   . Arthrosis of right acromioclavicular joint   . Osteoarthritis of left knee 06/26/2013  . Derangement of posterior horn of medial meniscus 06/26/2013  . Left knee pain 06/26/2013  . Insomnia 04/20/2011  . OSA (obstructive sleep apnea) 04/20/2011  . OBESITY 12/20/2009  . TRANSAMINASES, SERUM, ELEVATED 12/20/2009  . RECTAL BLEEDING 08/13/2008  . HYPERCHOLESTEROLEMIA 08/12/2008  . DEHYDRATION 08/12/2008  . DEPRESSION/ANXIETY 08/12/2008  . ALCOHOL ABUSE 08/12/2008  . HYPERTENSION 08/12/2008  . [facility] 08/12/2008  . GASTROESOPHAGEAL REFLUX DISEASE, CHRONIC 08/12/2008  . FEVER UNSPECIFIED 08/12/2008  . VOMITING 08/12/2008  . DIARRHEA 08/12/2008   Guadelupe Sabin, OTR/L  [phone] 04/18/2018, 5:07 PM  [place] [address] [place], [place], [address] Phone: [phone]   Fax:  [phone]  Name: Ryan Hickman MRN: [id number] Date of Birth: November 10, 1960

## 2018-04-18 NOTE — Patient Instructions (Signed)
1) Seated Row   Sit up straight with elbows by your sides. Pull back with shoulders/elbows, keeping forearms straight, as if pulling back on the reins of a horse. Squeeze shoulder blades together. Repeat _15__times, __3__sets/day    2) Shoulder Elevation    Sit up straight with arms by your sides. Slowly bring your shoulders up towards your ears. Repeat_15__times, _3___ sets/day    3) Shoulder Extension    Sit up straight with both arms by your side, draw your arms back behind your waist. Keep your elbows straight. Repeat __15__times, _3___sets/day.     SHOULDER: Flexion On Table   Place hands on towel placed on table, elbows straight. Lean forward with you upper body, pushing towel away from body._15__ reps per set, __3_ sets per day  Abduction (Passive)   With arm out to side, resting on towel placed on table, keeping trunk away from table, lean to the side while pushing towel away from body.  Repeat _15___ times. Do __3__ sessions per day.

## 2018-04-19 ENCOUNTER — Ambulatory Visit (HOSPITAL_COMMUNITY): Payer: 59

## 2018-04-19 DIAGNOSIS — N529 Male erectile dysfunction, unspecified: Secondary | ICD-10-CM | POA: Diagnosis not present

## 2018-04-19 DIAGNOSIS — I10 Essential (primary) hypertension: Secondary | ICD-10-CM | POA: Diagnosis not present

## 2018-04-19 DIAGNOSIS — G4733 Obstructive sleep apnea (adult) (pediatric): Secondary | ICD-10-CM | POA: Diagnosis not present

## 2018-04-19 DIAGNOSIS — N400 Enlarged prostate without lower urinary tract symptoms: Secondary | ICD-10-CM | POA: Diagnosis not present

## 2018-04-23 ENCOUNTER — Ambulatory Visit (HOSPITAL_COMMUNITY): Payer: 59

## 2018-04-23 ENCOUNTER — Encounter (HOSPITAL_COMMUNITY): Payer: Self-pay

## 2018-04-23 DIAGNOSIS — M25611 Stiffness of right shoulder, not elsewhere classified: Secondary | ICD-10-CM

## 2018-04-23 DIAGNOSIS — M25511 Pain in right shoulder: Secondary | ICD-10-CM | POA: Diagnosis not present

## 2018-04-23 DIAGNOSIS — R29898 Other symptoms and signs involving the musculoskeletal system: Secondary | ICD-10-CM

## 2018-04-23 NOTE — Patient Instructions (Signed)

## 2018-04-23 NOTE — Therapy (Signed)
[place] [address] [place], [place], [address] Phone: [phone]   Fax:  [phone]  Occupational Therapy Treatment  Patient Details  Name: Ryan Hickman MRN: [id number] Date of Birth: Nov 15, 1960 Referring Provider (OT): Dr. Arther Abbott   Encounter Date: 04/23/2018  OT End of Session - 04/23/18 1231    Visit Number  2    Number of Visits  16    Date for OT Re-Evaluation  06/17/18   mini-reassessment 05/16/2018   Authorization Type  December 2019: [place]; [place] in Jan 2020-will need to call again for benefits    Authorization Time Period  60 visit limit with [place] - Visit Number  2    Authorization - Number of Visits  60    OT Start Time  1118    OT Stop Time  1202    OT Time Calculation (min)  44 min    Activity Tolerance  Patient tolerated treatment well    Behavior During Therapy  [facility] for tasks assessed/performed       Past Medical History:  Diagnosis Date  . Acid reflux   . Arthritis   . Childhood asthma    as child  . Depression   . History of ETOH abuse   . Hyperlipidemia   . Hypertension   . Insomnia   . Sleep apnea    uses CPAP, cannot tolerate  . Urticaria     Past Surgical History:  Procedure Laterality Date  . COLONOSCOPY  May 2010   Dr. Oneida Alar: simple adenoma, normal colon. Repeat 2020  . EAR CYST EXCISION N/A 11/14/2013   Procedure: EXCISION SEBACEOUS CYST CHEST WALL;  Surgeon: Jamesetta So, MD;  Location: [facility];  Service: General;  Laterality: N/A;  . KNEE ARTHROSCOPY WITH MEDIAL MENISECTOMY Left 08/24/2016   Procedure: KNEE ARTHROSCOPY WITH MEDIAL MENISECTOMY;  Surgeon: Carole Civil, MD;  Location: [facility];  Service: Orthopedics;  Laterality: Left;  . KNEE SURGERY Right 1992  . SHOULDER OPEN ROTATOR CUFF REPAIR Right 03/07/2018   Procedure: ROTATOR CUFF REPAIR SHOULDER OPEN with distal clavicle excison superior capsular reconstruction;  Surgeon: Carole Civil, MD;   Location: [facility];  Service: Orthopedics;  Laterality: Right;  . UPPER GASTROINTESTINAL ENDOSCOPY  May 2010   Dr. Oneida Alar: normal    There were no vitals filed for this visit.  Subjective Assessment - 04/23/18 1138    Subjective   S: I arm is a little sore now that I'm doing exercises with it.     Currently in Pain?  Yes    Pain Score  2     Pain Location  Shoulder    Pain Orientation  Right    Pain Descriptors / Indicators  Sore    Pain Type  Acute pain    Pain Radiating Towards  N/A    Pain Onset  In the past 7 days    Pain Frequency  Constant    Aggravating Factors   HEP, movement    Pain Relieving Factors  N/A    Effect of Pain on Daily Activities  min effect         [facility] OT Assessment - 04/23/18 1142      Assessment   Medical Diagnosis  s/p right RC repair      Precautions   Precautions  Shoulder    Type of Shoulder Precautions  Sitting/standing: No lifting arm past 90 degrees. Progress as tolerated in supine.  No lifting over 5#.                OT Treatments/Exercises (OP) - 04/23/18 1142      Exercises   Exercises  Shoulder      Shoulder Exercises: Supine   Protraction  PROM;AROM;10 reps    Horizontal ABduction  PROM;AROM;10 reps    External Rotation  PROM;AROM;10 reps    Internal Rotation  PROM;AROM;10 reps    Flexion  PROM;AROM;10 reps    ABduction  PROM;AROM;10 reps      Shoulder Exercises: Standing   Protraction  AAROM;10 reps    Horizontal ABduction  AAROM;10 reps    External Rotation  AAROM;10 reps    Internal Rotation  AAROM;10 reps    Flexion  AAROM;10 reps    ABduction  AAROM;10 reps      Manual Therapy   Manual Therapy  Myofascial release    Manual therapy comments  Manual therapy completed prior to exercises.     Myofascial Release  Myofascial release and manual stretching completed to right upper arm, trapezius, and scapularis region to decrease fascial restrictions and increase joint mobility needed for functional tasks.               OT Education - 04/23/18 1231    Education Details  Standing AA/ROM shoulder exercises - remain at or below shoulder level. Reviewed OT goals with patient.    Person(s) Educated  Patient    Methods  Explanation;Verbal cues;Handout;Demonstration    Comprehension  Verbalized understanding;Returned demonstration       OT Short Term Goals - 04/23/18 1232      OT SHORT TERM GOAL #1   Title  Pt will be provided with and educated on HEP to improve functional use of RUE as dominant during ADL completion.     Time  4    Period  Weeks    Status  On-going      OT SHORT TERM GOAL #2   Title  Pt will decrease pain in RUE to 4/10  to improve ability to sleep at night for 5 consecutive hours or greater.     Time  4    Period  Weeks    Status  On-going      OT SHORT TERM GOAL #3   Title  Pt will improve RUE A/ROM to [facility] to improve ability to use RUE as assist during dressing tasks.     Time  4    Period  Weeks    Status  On-going        OT Long Term Goals - 04/23/18 1232      OT LONG TERM GOAL #1   Title  Pt will return to highest level of functioning using RUE as dominant during all daily and work tasks.     Time  8    Period  Weeks    Status  On-going      OT LONG TERM GOAL #2   Title  Pt will decrease pain to 2/10 or less in RUE to improve mobility required for ADL task completion.     Time  8    Period  Weeks    Status  On-going      OT LONG TERM GOAL #3   Title  Pt will improve RUE A/ROM to WNL to improve mobility required for reaching overhead.     Time  8    Period  Weeks    Status  On-going  OT LONG TERM GOAL #4   Title  Pt will decrease fascial restrictions in RUE to minimal amounts or less to improve flexibility required for functional reaching tasks.     Time  8    Period  Weeks    Status  On-going      OT LONG TERM GOAL #5   Title  Pt will improve strength in RUE to 5/5 to increase ability to perform work tasks including overhead lifting.      Time  8    Period  Weeks    Status  On-going            Plan - 04/23/18 1232    Clinical Impression Statement  A: Initiated myofascial release, manual stretching, A/ROM supine, and AA/ROM standing. Remained at or below shoulder level for standing exercises. Updated HEP and instructed patient to complete no higher than shoulder level at this time if standing. Pt with moderate/max fascial restrictions in anterior portion of shoulder with manual techniques completed to address.     Plan  P: Add wall wash and pulleys. Follow up on HEP.    Consulted and Agree with Plan of Care  Patient       Patient will benefit from skilled therapeutic intervention in order to improve the following deficits and impairments:  Decreased range of motion, Impaired flexibility, Decreased activity tolerance, Increased fascial restrictions, Impaired UE functional use, Pain, Decreased strength  Visit Diagnosis: Acute pain of right shoulder  Stiffness of right shoulder, not elsewhere classified  Other symptoms and signs involving the musculoskeletal system    Problem List Patient Active Problem List   Diagnosis Date Noted  . S/P right rotator cuff repair 03/07/18 03/08/2018  . Traumatic complete tear of right rotator cuff   . Arthrosis of right acromioclavicular joint   . Osteoarthritis of left knee 06/26/2013  . Derangement of posterior horn of medial meniscus 06/26/2013  . Left knee pain 06/26/2013  . Insomnia 04/20/2011  . OSA (obstructive sleep apnea) 04/20/2011  . OBESITY 12/20/2009  . TRANSAMINASES, SERUM, ELEVATED 12/20/2009  . RECTAL BLEEDING 08/13/2008  . HYPERCHOLESTEROLEMIA 08/12/2008  . DEHYDRATION 08/12/2008  . DEPRESSION/ANXIETY 08/12/2008  . ALCOHOL ABUSE 08/12/2008  . HYPERTENSION 08/12/2008  . [facility] 08/12/2008  . GASTROESOPHAGEAL REFLUX DISEASE, CHRONIC 08/12/2008  . FEVER UNSPECIFIED 08/12/2008  . VOMITING 08/12/2008  . DIARRHEA 08/12/2008   Ailene Ravel, OTR/L,CBIS   [phone]  04/23/2018, 12:35 PM  [place] [address] [place], [place], [address] Phone: [phone]   Fax:  [phone]  Name: RAYMAR JOINER MRN: [id number] Date of Birth: [birth year]

## 2018-04-25 ENCOUNTER — Ambulatory Visit (HOSPITAL_COMMUNITY): Payer: BLUE CROSS/BLUE SHIELD | Attending: Orthopedic Surgery | Admitting: Occupational Therapy

## 2018-04-25 ENCOUNTER — Encounter (HOSPITAL_COMMUNITY): Payer: Self-pay | Admitting: Occupational Therapy

## 2018-04-25 DIAGNOSIS — R29898 Other symptoms and signs involving the musculoskeletal system: Secondary | ICD-10-CM | POA: Insufficient documentation

## 2018-04-25 DIAGNOSIS — M25611 Stiffness of right shoulder, not elsewhere classified: Secondary | ICD-10-CM | POA: Diagnosis not present

## 2018-04-25 DIAGNOSIS — M25511 Pain in right shoulder: Secondary | ICD-10-CM | POA: Diagnosis not present

## 2018-04-25 NOTE — Therapy (Signed)
[facility] [address] [place], [place], [address] Phone: [phone]   Fax:  [phone]  Occupational Therapy Treatment  Patient Details  Name: Ryan Hickman MRN: [id number] Date of Birth: 1960-10-10 Referring Provider (OT): Dr. Arther Abbott   Encounter Date: 04/25/2018  OT End of Session - 04/25/18 0927    Visit Number  3    Number of Visits  16    Date for OT Re-Evaluation  06/17/18   mini-reassessment 05/16/2018   Authorization Type  December 2019: [place]; [place] in Jan 2020-will need to call again for benefits    Authorization Time Period  60 visit limit with [place] - Visit Number  3    Authorization - Number of Visits  60    OT Start Time  0845    OT Stop Time  0924    OT Time Calculation (min)  39 min    Activity Tolerance  Patient tolerated treatment well    Behavior During Therapy  [facility] for tasks assessed/performed       Past Medical History:  Diagnosis Date  . Acid reflux   . Arthritis   . Childhood asthma    as child  . Depression   . History of ETOH abuse   . Hyperlipidemia   . Hypertension   . Insomnia   . Sleep apnea    uses CPAP, cannot tolerate  . Urticaria     Past Surgical History:  Procedure Laterality Date  . COLONOSCOPY  May 2010   Dr. Oneida Alar: simple adenoma, normal colon. Repeat 2020  . EAR CYST EXCISION N/A 11/14/2013   Procedure: EXCISION SEBACEOUS CYST CHEST WALL;  Surgeon: Jamesetta So, MD;  Location: [facility];  Service: General;  Laterality: N/A;  . KNEE ARTHROSCOPY WITH MEDIAL MENISECTOMY Left 08/24/2016   Procedure: KNEE ARTHROSCOPY WITH MEDIAL MENISECTOMY;  Surgeon: Carole Civil, MD;  Location: [facility];  Service: Orthopedics;  Laterality: Left;  . KNEE SURGERY Right 1992  . SHOULDER OPEN ROTATOR CUFF REPAIR Right 03/07/2018   Procedure: ROTATOR CUFF REPAIR SHOULDER OPEN with distal clavicle excison superior capsular reconstruction;  Surgeon: Carole Civil, MD;   Location: [facility];  Service: Orthopedics;  Laterality: Right;  . UPPER GASTROINTESTINAL ENDOSCOPY  May 2010   Dr. Oneida Alar: normal    There were no vitals filed for this visit.  Subjective Assessment - 04/25/18 0843    Subjective   S: I'm still sore but it's definately better than the first day.     Currently in Pain?  Yes    Pain Score  3     Pain Location  Shoulder    Pain Orientation  Right    Pain Descriptors / Indicators  Sore    Pain Type  Acute pain    Pain Radiating Towards  N/A    Pain Onset  In the past 7 days    Pain Frequency  Constant    Aggravating Factors   HEP, movement    Pain Relieving Factors  N/A    Effect of Pain on Daily Activities  min effect         [facility] OT Assessment - 04/25/18 0842      Assessment   Medical Diagnosis  s/p right RC repair      Precautions   Precautions  Shoulder    Type of Shoulder Precautions  Sitting/standing: No lifting arm past 90 degrees. Progress as tolerated in supine. No lifting  over 5#.                OT Treatments/Exercises (OP) - 04/25/18 0849      Exercises   Exercises  Shoulder      Shoulder Exercises: Supine   Protraction  PROM;AROM;10 reps    Horizontal ABduction  PROM;AROM;10 reps    External Rotation  PROM;AROM;10 reps    Internal Rotation  PROM;AROM;10 reps    Flexion  PROM;AROM;10 reps    ABduction  PROM;AROM;10 reps      Shoulder Exercises: Standing   Protraction  AAROM;10 reps    Horizontal ABduction  AAROM;10 reps    External Rotation  AAROM;10 reps    Internal Rotation  AAROM;10 reps    Flexion  AAROM;10 reps    ABduction  AAROM;10 reps      Shoulder Exercises: Pulleys   Flexion  1 minute    ABduction  1 minute      Shoulder Exercises: ROM/Strengthening   Thumb Tacks  1'    Proximal Shoulder Strengthening, Supine  10X each no rest breaks    Other ROM/Strengthening Exercises  90 degrees on door-wash wash proximal shoulder strengthening, 1' flexion      Manual Therapy   Manual  Therapy  Myofascial release    Manual therapy comments  Manual therapy completed prior to exercises.     Myofascial Release  Myofascial release and manual stretching completed to right upper arm, trapezius, and scapularis region to decrease fascial restrictions and increase joint mobility needed for functional tasks.                OT Short Term Goals - 04/23/18 1232      OT SHORT TERM GOAL #1   Title  Pt will be provided with and educated on HEP to improve functional use of RUE as dominant during ADL completion.     Time  4    Period  Weeks    Status  On-going      OT SHORT TERM GOAL #2   Title  Pt will decrease pain in RUE to 4/10  to improve ability to sleep at night for 5 consecutive hours or greater.     Time  4    Period  Weeks    Status  On-going      OT SHORT TERM GOAL #3   Title  Pt will improve RUE A/ROM to [facility] to improve ability to use RUE as assist during dressing tasks.     Time  4    Period  Weeks    Status  On-going        OT Long Term Goals - 04/23/18 1232      OT LONG TERM GOAL #1   Title  Pt will return to highest level of functioning using RUE as dominant during all daily and work tasks.     Time  8    Period  Weeks    Status  On-going      OT LONG TERM GOAL #2   Title  Pt will decrease pain to 2/10 or less in RUE to improve mobility required for ADL task completion.     Time  8    Period  Weeks    Status  On-going      OT LONG TERM GOAL #3   Title  Pt will improve RUE A/ROM to WNL to improve mobility required for reaching overhead.     Time  8    Period  Weeks    Status  On-going      OT LONG TERM GOAL #4   Title  Pt will decrease fascial restrictions in RUE to minimal amounts or less to improve flexibility required for functional reaching tasks.     Time  8    Period  Weeks    Status  On-going      OT LONG TERM GOAL #5   Title  Pt will improve strength in RUE to 5/5 to increase ability to perform work tasks including overhead  lifting.     Time  8    Period  Weeks    Status  On-going            Plan - 04/25/18 6834    Clinical Impression Statement  A: Continued with manual techniques to address fascial restrictions in right anterior shoulder regions. Continued with A/ROM in supine and AA/ROM in standing, limiting ROM to 90 degrees in standing. Added proximal shoulder strengthening and thumb tacks, moderate fatigue at end of tasks. Also added pulleys, allowing pt to complete to tolerance as no stretch is felt at 90 degrees. Verbal cuing for form and technique during exercises.     Plan  P: Continue with ROM exercises increasing repetitions to 12 both supine and standing. Add proximal shoulder strengthening in standing       Patient will benefit from skilled therapeutic intervention in order to improve the following deficits and impairments:  Decreased range of motion, Impaired flexibility, Decreased activity tolerance, Increased fascial restrictions, Impaired UE functional use, Pain, Decreased strength  Visit Diagnosis: Acute pain of right shoulder  Stiffness of right shoulder, not elsewhere classified  Other symptoms and signs involving the musculoskeletal system    Problem List Patient Active Problem List   Diagnosis Date Noted  . S/P right rotator cuff repair 03/07/18 03/08/2018  . Traumatic complete tear of right rotator cuff   . Arthrosis of right acromioclavicular joint   . Osteoarthritis of left knee 06/26/2013  . Derangement of posterior horn of medial meniscus 06/26/2013  . Left knee pain 06/26/2013  . Insomnia 04/20/2011  . OSA (obstructive sleep apnea) 04/20/2011  . OBESITY 12/20/2009  . TRANSAMINASES, SERUM, ELEVATED 12/20/2009  . RECTAL BLEEDING 08/13/2008  . HYPERCHOLESTEROLEMIA 08/12/2008  . DEHYDRATION 08/12/2008  . DEPRESSION/ANXIETY 08/12/2008  . ALCOHOL ABUSE 08/12/2008  . HYPERTENSION 08/12/2008  . [facility] 08/12/2008  . GASTROESOPHAGEAL REFLUX DISEASE, CHRONIC 08/12/2008  .  FEVER UNSPECIFIED 08/12/2008  . VOMITING 08/12/2008  . DIARRHEA 08/12/2008   Guadelupe Sabin, OTR/L  [phone] 04/25/2018, 9:42 AM  [facility] [address] [place], [place], [address] Phone: [phone]   Fax:  [phone]  Name: LESHAUN BIEBEL MRN: [id number] Date of Birth: 12/28/1960

## 2018-04-30 ENCOUNTER — Other Ambulatory Visit: Payer: Self-pay | Admitting: Orthopedic Surgery

## 2018-04-30 NOTE — Telephone Encounter (Signed)
Ibuprofen 800 MG tablet  Qty 90 Tablets Take 1 tablet (800 mg total) by mouth every 8 (eight) hours as needed.  PATIENT USES [facility]

## 2018-04-30 NOTE — Telephone Encounter (Signed)
Hydrocodone-Acetaminophen  7.5/325 mg  Qty 42 Tablets  Take 1 tablet by mouth every 4 (four) hours as needed for up to 7 days for moderate pain.  PATIENT USES [facility]

## 2018-05-01 MED ORDER — IBUPROFEN 800 MG PO TABS: 800.0000 mg | ORAL_TABLET | Freq: Three times a day (TID) | ORAL | 1 refills | Status: DC | PRN

## 2018-05-01 MED ORDER — HYDROCODONE-ACETAMINOPHEN 7.5-325 MG PO TABS: 1.0000 | ORAL_TABLET | Freq: Four times a day (QID) | ORAL | 0 refills | Status: DC | PRN

## 2018-05-02 ENCOUNTER — Ambulatory Visit (HOSPITAL_COMMUNITY): Payer: BLUE CROSS/BLUE SHIELD | Admitting: Occupational Therapy

## 2018-05-02 ENCOUNTER — Encounter (HOSPITAL_COMMUNITY): Payer: Self-pay | Admitting: Occupational Therapy

## 2018-05-02 DIAGNOSIS — R29898 Other symptoms and signs involving the musculoskeletal system: Secondary | ICD-10-CM

## 2018-05-02 DIAGNOSIS — M25611 Stiffness of right shoulder, not elsewhere classified: Secondary | ICD-10-CM | POA: Diagnosis not present

## 2018-05-02 DIAGNOSIS — M25511 Pain in right shoulder: Secondary | ICD-10-CM | POA: Diagnosis not present

## 2018-05-02 NOTE — Therapy (Signed)
[facility] [address] [place], [place], [address] Phone: [phone]   Fax:  [phone]  Occupational Therapy Treatment  Patient Details  Name: Ryan Hickman MRN: [id number] Date of Birth: [birth year] Referring Provider (OT): Dr. Arther Abbott   Encounter Date: 05/02/2018  OT End of Session - 05/02/18 1339    Visit Number  4    Number of Visits  16    Date for OT Re-Evaluation  06/17/18   mini-reassessment 05/16/2018   Authorization Type  December 2019: [place]; [place] in Jan 2020-will need to call again for benefits    Authorization Time Period  60 visit limit with [place] - Visit Number  4    Authorization - Number of Visits  60    OT Start Time  1300    OT Stop Time  1338    OT Time Calculation (min)  38 min    Activity Tolerance  Patient tolerated treatment well    Behavior During Therapy  [facility] for tasks assessed/performed       Past Medical History:  Diagnosis Date  . Acid reflux   . Arthritis   . Childhood asthma    as child  . Depression   . History of ETOH abuse   . Hyperlipidemia   . Hypertension   . Insomnia   . Sleep apnea    uses CPAP, cannot tolerate  . Urticaria     Past Surgical History:  Procedure Laterality Date  . COLONOSCOPY  May 2010   Dr. Oneida Alar: simple adenoma, normal colon. Repeat 2020  . EAR CYST EXCISION N/A 11/14/2013   Procedure: EXCISION SEBACEOUS CYST CHEST WALL;  Surgeon: Jamesetta So, MD;  Location: [facility];  Service: General;  Laterality: N/A;  . KNEE ARTHROSCOPY WITH MEDIAL MENISECTOMY Left 08/24/2016   Procedure: KNEE ARTHROSCOPY WITH MEDIAL MENISECTOMY;  Surgeon: Carole Civil, MD;  Location: [facility];  Service: Orthopedics;  Laterality: Left;  . KNEE SURGERY Right 1992  . SHOULDER OPEN ROTATOR CUFF REPAIR Right 03/07/2018   Procedure: ROTATOR CUFF REPAIR SHOULDER OPEN with distal clavicle excison superior capsular reconstruction;  Surgeon: Carole Civil, MD;   Location: [facility];  Service: Orthopedics;  Laterality: Right;  . UPPER GASTROINTESTINAL ENDOSCOPY  May 2010   Dr. Oneida Alar: normal    There were no vitals filed for this visit.  Subjective Assessment - 05/02/18 1257    Subjective   S: It's been kind of stiff the past couple of days.     Currently in Pain?  No/denies         [facility] OT Assessment - 05/02/18 1256      Assessment   Medical Diagnosis  s/p right RC repair      Precautions   Precautions  Shoulder    Type of Shoulder Precautions  Sitting/standing: No lifting arm past 90 degrees. Progress as tolerated in supine. No lifting over 5#.                OT Treatments/Exercises (OP) - 05/02/18 1304      Exercises   Exercises  Shoulder      Shoulder Exercises: Supine   Protraction  PROM;5 reps;AROM;12 reps    Horizontal ABduction  PROM;5 reps;AROM;12 reps    External Rotation  PROM;5 reps;AROM;12 reps    Internal Rotation  PROM;5 reps;AROM;12 reps    Flexion  PROM;5 reps;AROM;12 reps    ABduction  PROM;5 reps;AROM;12 reps  Shoulder Exercises: Standing   Protraction  AAROM;12 reps    Horizontal ABduction  AAROM;12 reps    External Rotation  AAROM;12 reps    Internal Rotation  AAROM;12 reps    Flexion  AAROM;12 reps    ABduction  AAROM;12 reps      Shoulder Exercises: Pulleys   Flexion  1 minute    ABduction  1 minute      Shoulder Exercises: ROM/Strengthening   Thumb Tacks  1'    Proximal Shoulder Strengthening, Supine  10X each no rest breaks    Other ROM/Strengthening Exercises  90 degrees on door-wash wash proximal shoulder strengthening, 1' flexion    Other ROM/Strengthening Exercises  PVC pipe slide, 10X flexion      Manual Therapy   Manual Therapy  Myofascial release    Manual therapy comments  Manual therapy completed prior to exercises.     Myofascial Release  Myofascial release and manual stretching completed to right upper arm, trapezius, and scapularis region to decrease fascial restrictions  and increase joint mobility needed for functional tasks.                OT Short Term Goals - 04/23/18 1232      OT SHORT TERM GOAL #1   Title  Pt will be provided with and educated on HEP to improve functional use of RUE as dominant during ADL completion.     Time  4    Period  Weeks    Status  On-going      OT SHORT TERM GOAL #2   Title  Pt will decrease pain in RUE to 4/10  to improve ability to sleep at night for 5 consecutive hours or greater.     Time  4    Period  Weeks    Status  On-going      OT SHORT TERM GOAL #3   Title  Pt will improve RUE A/ROM to [facility] to improve ability to use RUE as assist during dressing tasks.     Time  4    Period  Weeks    Status  On-going        OT Long Term Goals - 04/23/18 1232      OT LONG TERM GOAL #1   Title  Pt will return to highest level of functioning using RUE as dominant during all daily and work tasks.     Time  8    Period  Weeks    Status  On-going      OT LONG TERM GOAL #2   Title  Pt will decrease pain to 2/10 or less in RUE to improve mobility required for ADL task completion.     Time  8    Period  Weeks    Status  On-going      OT LONG TERM GOAL #3   Title  Pt will improve RUE A/ROM to WNL to improve mobility required for reaching overhead.     Time  8    Period  Weeks    Status  On-going      OT LONG TERM GOAL #4   Title  Pt will decrease fascial restrictions in RUE to minimal amounts or less to improve flexibility required for functional reaching tasks.     Time  8    Period  Weeks    Status  On-going      OT LONG TERM GOAL #5   Title  Pt will improve strength  in RUE to 5/5 to increase ability to perform work tasks including overhead lifting.     Time  8    Period  Weeks    Status  On-going            Plan - 05/02/18 1339    Clinical Impression Statement  A: Continued with manual techniques to address fascial restrictions in right anterior shoulder and trapezius regions. Continued with  A/ROM supine, pt with ROM WFL, and AA/ROM staying at 50% range and increasing all repetitions to 12. Added PVC pipe slide this session and continued with pulleys and proximal shoulder strengthening at doorway. Verbal cuing for form and technique.     Plan  P: Add proximal shoulder strengthening in standing, add scapular theraband row and extension       Patient will benefit from skilled therapeutic intervention in order to improve the following deficits and impairments:  Decreased range of motion, Impaired flexibility, Decreased activity tolerance, Increased fascial restrictions, Impaired UE functional use, Pain, Decreased strength  Visit Diagnosis: Acute pain of right shoulder  Stiffness of right shoulder, not elsewhere classified  Other symptoms and signs involving the musculoskeletal system    Problem List Patient Active Problem List   Diagnosis Date Noted  . S/P right rotator cuff repair 03/07/18 03/08/2018  . Traumatic complete tear of right rotator cuff   . Arthrosis of right acromioclavicular joint   . Osteoarthritis of left knee 06/26/2013  . Derangement of posterior horn of medial meniscus 06/26/2013  . Left knee pain 06/26/2013  . Insomnia 04/20/2011  . OSA (obstructive sleep apnea) 04/20/2011  . OBESITY 12/20/2009  . TRANSAMINASES, SERUM, ELEVATED 12/20/2009  . RECTAL BLEEDING 08/13/2008  . HYPERCHOLESTEROLEMIA 08/12/2008  . DEHYDRATION 08/12/2008  . DEPRESSION/ANXIETY 08/12/2008  . ALCOHOL ABUSE 08/12/2008  . HYPERTENSION 08/12/2008  . [facility] 08/12/2008  . GASTROESOPHAGEAL REFLUX DISEASE, CHRONIC 08/12/2008  . FEVER UNSPECIFIED 08/12/2008  . VOMITING 08/12/2008  . DIARRHEA 08/12/2008   Guadelupe Sabin, OTR/L  [phone] 05/02/2018, 1:41 PM  [facility] [address] [place], [place], [address] Phone: [phone]   Fax:  [phone]  Name: JAEKWON MCCLUNE MRN: [id number] Date of Birth: 1961/04/12

## 2018-05-03 ENCOUNTER — Ambulatory Visit (HOSPITAL_COMMUNITY): Payer: BLUE CROSS/BLUE SHIELD

## 2018-05-03 ENCOUNTER — Encounter (HOSPITAL_COMMUNITY): Payer: Self-pay

## 2018-05-03 DIAGNOSIS — R29898 Other symptoms and signs involving the musculoskeletal system: Secondary | ICD-10-CM | POA: Diagnosis not present

## 2018-05-03 DIAGNOSIS — M25511 Pain in right shoulder: Secondary | ICD-10-CM

## 2018-05-03 DIAGNOSIS — M25611 Stiffness of right shoulder, not elsewhere classified: Secondary | ICD-10-CM | POA: Diagnosis not present

## 2018-05-03 NOTE — Therapy (Signed)
[facility] [place], [place], [address] Phone: [phone]   Fax:  [phone]  Occupational Therapy Treatment  Patient Details  Name: Ryan Hickman MRN: [id number] Date of Birth: 12-01-1960 Referring Provider (OT): Dr. Arther Abbott   Encounter Date: 05/03/2018  OT End of Session - 05/03/18 1418    Visit Number  5    Number of Visits  16    Date for OT Re-Evaluation  06/17/18   mini-reassessment 05/16/2018   Authorization Type  [employer] $25 copay     Authorization Time Period  60 visit limit with [employer]    Authorization - Visit Number  3    Authorization - Number of Visits  60    OT Start Time  1350    OT Stop Time  1430    OT Time Calculation (min)  40 min    Activity Tolerance  Patient tolerated treatment well    Behavior During Therapy  [facility] for tasks assessed/performed       Past Medical History:  Diagnosis Date  . Acid reflux   . Arthritis   . Childhood asthma    as child  . Depression   . History of ETOH abuse   . Hyperlipidemia   . Hypertension   . Insomnia   . Sleep apnea    uses CPAP, cannot tolerate  . Urticaria     Past Surgical History:  Procedure Laterality Date  . COLONOSCOPY  May 2010   Dr. Oneida Alar: simple adenoma, normal colon. Repeat 2020  . EAR CYST EXCISION N/A 11/14/2013   Procedure: EXCISION SEBACEOUS CYST CHEST WALL;  Surgeon: Jamesetta So, MD;  Location: [facility];  Service: General;  Laterality: N/A;  . KNEE ARTHROSCOPY WITH MEDIAL MENISECTOMY Left 08/24/2016   Procedure: KNEE ARTHROSCOPY WITH MEDIAL MENISECTOMY;  Surgeon: Carole Civil, MD;  Location: [facility];  Service: Orthopedics;  Laterality: Left;  . KNEE SURGERY Right 1992  . SHOULDER OPEN ROTATOR CUFF REPAIR Right 03/07/2018   Procedure: ROTATOR CUFF REPAIR SHOULDER OPEN with distal clavicle excison superior capsular reconstruction;  Surgeon: Carole Civil, MD;  Location: [facility];  Service: Orthopedics;  Laterality: Right;   . UPPER GASTROINTESTINAL ENDOSCOPY  May 2010   Dr. Oneida Alar: normal    There were no vitals filed for this visit.  Subjective Assessment - 05/03/18 1403    Subjective   S: I'm sore from yesterday. She worked me hard.     Currently in Pain?  Yes    Pain Score  2     Pain Location  Shoulder    Pain Orientation  Right    Pain Descriptors / Indicators  Sore    Pain Type  Acute pain    Pain Radiating Towards  N/A    Pain Onset  Yesterday    Pain Frequency  Constant    Aggravating Factors   therapy yesterday    Pain Relieving Factors  pain medication    Effect of Pain on Daily Activities  min effect    Multiple Pain Sites  No         [facility] OT Assessment - 05/03/18 1406      Assessment   Medical Diagnosis  s/p right RC repair      Precautions   Precautions  Shoulder    Type of Shoulder Precautions  Sitting/standing: No lifting arm past 90 degrees. Progress as tolerated in supine. No lifting over 5#.  OT Treatments/Exercises (OP) - 05/03/18 1406      Exercises   Exercises  Shoulder      Shoulder Exercises: Supine   Protraction  PROM;5 reps;AROM;12 reps    Horizontal ABduction  PROM;5 reps;AROM;12 reps    External Rotation  PROM;5 reps;AROM;12 reps    Internal Rotation  PROM;5 reps;AROM;12 reps    Flexion  PROM;5 reps;AROM;12 reps    ABduction  PROM;5 reps;AROM;12 reps      Shoulder Exercises: Standing   Protraction  AAROM;12 reps    Horizontal ABduction  AAROM;12 reps    External Rotation  AAROM;12 reps    Internal Rotation  AAROM;12 reps    Flexion  AAROM;12 reps    ABduction  AAROM;12 reps    Extension  Theraband;10 reps    Theraband Level (Shoulder Extension)  Level 2 (Red)    Row  Theraband;10 reps    Theraband Level (Shoulder Row)  Level 2 (Red)      Shoulder Exercises: ROM/Strengthening   Thumb Tacks  1'    Proximal Shoulder Strengthening, Supine  10X each no rest breaks    Proximal Shoulder Strengthening, Seated  10X no rest breaks     Other ROM/Strengthening Exercises  90 degrees on door-wash wash proximal shoulder strengthening, 1' flexion    Other ROM/Strengthening Exercises  PVC pipe slide, 10X flexion      Manual Therapy   Manual Therapy  Myofascial release    Manual therapy comments  Manual therapy completed prior to exercises.     Myofascial Release  Myofascial release and manual stretching completed to right upper arm, trapezius, and scapularis region to decrease fascial restrictions and increase joint mobility needed for functional tasks.                OT Short Term Goals - 04/23/18 1232      OT SHORT TERM GOAL #1   Title  Pt will be provided with and educated on HEP to improve functional use of RUE as dominant during ADL completion.     Time  4    Period  Weeks    Status  On-going      OT SHORT TERM GOAL #2   Title  Pt will decrease pain in RUE to 4/10  to improve ability to sleep at night for 5 consecutive hours or greater.     Time  4    Period  Weeks    Status  On-going      OT SHORT TERM GOAL #3   Title  Pt will improve RUE A/ROM to [facility] to improve ability to use RUE as assist during dressing tasks.     Time  4    Period  Weeks    Status  On-going        OT Long Term Goals - 04/23/18 1232      OT LONG TERM GOAL #1   Title  Pt will return to highest level of functioning using RUE as dominant during all daily and work tasks.     Time  8    Period  Weeks    Status  On-going      OT LONG TERM GOAL #2   Title  Pt will decrease pain to 2/10 or less in RUE to improve mobility required for ADL task completion.     Time  8    Period  Weeks    Status  On-going      OT LONG TERM GOAL #3  Title  Pt will improve RUE A/ROM to WNL to improve mobility required for reaching overhead.     Time  8    Period  Weeks    Status  On-going      OT LONG TERM GOAL #4   Title  Pt will decrease fascial restrictions in RUE to minimal amounts or less to improve flexibility required for functional  reaching tasks.     Time  8    Period  Weeks    Status  On-going      OT LONG TERM GOAL #5   Title  Pt will improve strength in RUE to 5/5 to increase ability to perform work tasks including overhead lifting.     Time  8    Period  Weeks    Status  On-going            Plan - 05/03/18 1508    Clinical Impression Statement  A: Added theraband scapular strengthening for row and extension. Patient required VC for form and technique during session. Reports soreness from therapy yesterday although able complete all exercises required during session.     Plan  P: Continue with AA/ROM standing. Work on increasing passive ROM to allow for functional reaching tasks when able to complete.     Consulted and Agree with Plan of Care  Patient       Patient will benefit from skilled therapeutic intervention in order to improve the following deficits and impairments:     Visit Diagnosis: Acute pain of right shoulder  Stiffness of right shoulder, not elsewhere classified  Other symptoms and signs involving the musculoskeletal system    Problem List Patient Active Problem List   Diagnosis Date Noted  . S/P right rotator cuff repair 03/07/18 03/08/2018  . Traumatic complete tear of right rotator cuff   . Arthrosis of right acromioclavicular joint   . Osteoarthritis of left knee 06/26/2013  . Derangement of posterior horn of medial meniscus 06/26/2013  . Left knee pain 06/26/2013  . Insomnia 04/20/2011  . OSA (obstructive sleep apnea) 04/20/2011  . OBESITY 12/20/2009  . TRANSAMINASES, SERUM, ELEVATED 12/20/2009  . RECTAL BLEEDING 08/13/2008  . HYPERCHOLESTEROLEMIA 08/12/2008  . DEHYDRATION 08/12/2008  . DEPRESSION/ANXIETY 08/12/2008  . ALCOHOL ABUSE 08/12/2008  . HYPERTENSION 08/12/2008  . [facility] 08/12/2008  . GASTROESOPHAGEAL REFLUX DISEASE, CHRONIC 08/12/2008  . FEVER UNSPECIFIED 08/12/2008  . VOMITING 08/12/2008  . DIARRHEA 08/12/2008   Ailene Ravel, OTR/L,CBIS   [phone]  05/03/2018, 3:11 PM  [facility] [address] [place], [place], [address] Phone: [phone]   Fax:  [phone]  Name: Ryan Hickman MRN: [id number] Date of Birth: [birth year]

## 2018-05-07 ENCOUNTER — Encounter (HOSPITAL_COMMUNITY): Payer: Self-pay | Admitting: Occupational Therapy

## 2018-05-07 ENCOUNTER — Ambulatory Visit (HOSPITAL_COMMUNITY): Payer: BLUE CROSS/BLUE SHIELD | Admitting: Occupational Therapy

## 2018-05-07 DIAGNOSIS — M25611 Stiffness of right shoulder, not elsewhere classified: Secondary | ICD-10-CM

## 2018-05-07 DIAGNOSIS — M25511 Pain in right shoulder: Secondary | ICD-10-CM | POA: Diagnosis not present

## 2018-05-07 DIAGNOSIS — R29898 Other symptoms and signs involving the musculoskeletal system: Secondary | ICD-10-CM | POA: Diagnosis not present

## 2018-05-07 NOTE — Therapy (Signed)
[place], [place], [address] Phone: [phone]   Fax:  [phone]  Occupational Therapy Treatment  Patient Details  Name: Ryan Hickman MRN: [id number] Date of Birth: 1961-02-08 Referring Provider (OT): Dr. Arther Abbott   Encounter Date: 05/07/2018  OT End of Session - 05/07/18 1424    Visit Number  6    Number of Visits  16    Date for OT Re-Evaluation  06/17/18   mini-reassessment 05/16/2018   Authorization Type  [employer] $25 copay     Authorization Time Period  60 visit limit with [employer]    Authorization - Visit Number  4    Authorization - Number of Visits  60    OT Start Time  1342    OT Stop Time  1424    OT Time Calculation (min)  42 min    Activity Tolerance  Patient tolerated treatment well    Behavior During Therapy  [facility] for tasks assessed/performed       Past Medical History:  Diagnosis Date  . Acid reflux   . Arthritis   . Childhood asthma    as child  . Depression   . History of ETOH abuse   . Hyperlipidemia   . Hypertension   . Insomnia   . Sleep apnea    uses CPAP, cannot tolerate  . Urticaria     Past Surgical History:  Procedure Laterality Date  . COLONOSCOPY  May 2010   Dr. Oneida Alar: simple adenoma, normal colon. Repeat 2020  . EAR CYST EXCISION N/A 11/14/2013   Procedure: EXCISION SEBACEOUS CYST CHEST WALL;  Surgeon: Jamesetta So, MD;  Location: [facility];  Service: General;  Laterality: N/A;  . KNEE ARTHROSCOPY WITH MEDIAL MENISECTOMY Left 08/24/2016   Procedure: KNEE ARTHROSCOPY WITH MEDIAL MENISECTOMY;  Surgeon: Carole Civil, MD;  Location: [facility];  Service: Orthopedics;  Laterality: Left;  . KNEE SURGERY Right 1992  . SHOULDER OPEN ROTATOR CUFF REPAIR Right 03/07/2018   Procedure: ROTATOR CUFF REPAIR SHOULDER OPEN with distal clavicle excison superior capsular reconstruction;  Surgeon: Carole Civil, MD;  Location: [facility];  Service: Orthopedics;  Laterality: Right;   . UPPER GASTROINTESTINAL ENDOSCOPY  May 2010   Dr. Oneida Alar: normal    There were no vitals filed for this visit.  Subjective Assessment - 05/07/18 1333    Subjective   S: It's not bad today, just smarts a little bit.     Currently in Pain?  Yes    Pain Score  3     Pain Location  Shoulder    Pain Orientation  Right    Pain Descriptors / Indicators  Aching;Sore    Pain Type  Acute pain    Pain Radiating Towards  N/A    Pain Onset  In the past 7 days    Pain Frequency  Constant    Aggravating Factors   movement    Pain Relieving Factors  pain medication    Effect of Pain on Daily Activities  min effect    Multiple Pain Sites  No         [facility] OT Assessment - 05/07/18 1333      Assessment   Medical Diagnosis  s/p right RC repair      Precautions   Precautions  Shoulder    Type of Shoulder Precautions  Sitting/standing: No lifting arm past 90 degrees. Progress as tolerated in supine. No lifting over 5#.  OT Treatments/Exercises (OP) - 05/07/18 1333      Exercises   Exercises  Shoulder      Shoulder Exercises: Supine   Protraction  PROM;5 reps;AROM;12 reps    Horizontal ABduction  PROM;5 reps;AROM;12 reps    External Rotation  PROM;5 reps;AROM;12 reps    Internal Rotation  PROM;5 reps;AROM;12 reps    Flexion  PROM;5 reps;AROM;12 reps    ABduction  PROM;5 reps;AROM;12 reps      Shoulder Exercises: Standing   Protraction  AAROM;15 reps    Horizontal ABduction  AAROM;15 reps    External Rotation  AAROM;15 reps    Internal Rotation  AAROM;15 reps    Flexion  AAROM;15 reps    ABduction  AAROM;15 reps    Extension  Theraband;10 reps    Theraband Level (Shoulder Extension)  Level 2 (Red)    Row  Theraband;10 reps    Theraband Level (Shoulder Row)  Level 2 (Red)              Shoulder Exercises: Pulleys   Flexion  1 minute    ABduction  1 minute      Shoulder Exercises: ROM/Strengthening   Proximal Shoulder Strengthening, Supine  10X each  no rest breaks    Proximal Shoulder Strengthening, Seated  10X no rest breaks    Other ROM/Strengthening Exercises  90 degrees on door-wash wash proximal shoulder strengthening, 1' flexion, 1' abduction      Manual Therapy   Manual Therapy  Myofascial release    Manual therapy comments  Manual therapy completed prior to exercises.     Myofascial Release  Myofascial release and manual stretching completed to right upper arm, trapezius, and scapularis region to decrease fascial restrictions and increase joint mobility needed for functional tasks.                OT Short Term Goals - 04/23/18 1232      OT SHORT TERM GOAL #1   Title  Pt will be provided with and educated on HEP to improve functional use of RUE as dominant during ADL completion.     Time  4    Period  Weeks    Status  On-going      OT SHORT TERM GOAL #2   Title  Pt will decrease pain in RUE to 4/10  to improve ability to sleep at night for 5 consecutive hours or greater.     Time  4    Period  Weeks    Status  On-going      OT SHORT TERM GOAL #3   Title  Pt will improve RUE A/ROM to [facility] to improve ability to use RUE as assist during dressing tasks.     Time  4    Period  Weeks    Status  On-going        OT Long Term Goals - 04/23/18 1232      OT LONG TERM GOAL #1   Title  Pt will return to highest level of functioning using RUE as dominant during all daily and work tasks.     Time  8    Period  Weeks    Status  On-going      OT LONG TERM GOAL #2   Title  Pt will decrease pain to 2/10 or less in RUE to improve mobility required for ADL task completion.     Time  8    Period  Weeks    Status  On-going      OT LONG TERM GOAL #3   Title  Pt will improve RUE A/ROM to WNL to improve mobility required for reaching overhead.     Time  8    Period  Weeks    Status  On-going      OT LONG TERM GOAL #4   Title  Pt will decrease fascial restrictions in RUE to minimal amounts or less to improve flexibility  required for functional reaching tasks.     Time  8    Period  Weeks    Status  On-going      OT LONG TERM GOAL #5   Title  Pt will improve strength in RUE to 5/5 to increase ability to perform work tasks including overhead lifting.     Time  8    Period  Weeks    Status  On-going            Plan - 05/07/18 1425    Clinical Impression Statement  A: Continued with manual therapy to address fascial and scar restrictions, pt able to tolerate P/ROM [facility] today. Continued with AA/ROM and A/ROM increasing repetitions in standing to 15, added proximal shoulder strengthening on doorway in abduction-max difficulty with form towards end of 1'. Verbal cuing for form and technique.     Plan  P: Continue working on improving P/ROM to allow for functional reaching, Resume PVC pipe slide and increase repetitions to 20       Patient will benefit from skilled therapeutic intervention in order to improve the following deficits and impairments:  Decreased range of motion, Impaired flexibility, Decreased activity tolerance, Increased fascial restrictions, Impaired UE functional use, Pain, Decreased strength  Visit Diagnosis: Acute pain of right shoulder  Stiffness of right shoulder, not elsewhere classified  Other symptoms and signs involving the musculoskeletal system    Problem List Patient Active Problem List   Diagnosis Date Noted  . S/P right rotator cuff repair 03/07/18 03/08/2018  . Traumatic complete tear of right rotator cuff   . Arthrosis of right acromioclavicular joint   . Osteoarthritis of left knee 06/26/2013  . Derangement of posterior horn of medial meniscus 06/26/2013  . Left knee pain 06/26/2013  . Insomnia 04/20/2011  . OSA (obstructive sleep apnea) 04/20/2011  . OBESITY 12/20/2009  . TRANSAMINASES, SERUM, ELEVATED 12/20/2009  . RECTAL BLEEDING 08/13/2008  . HYPERCHOLESTEROLEMIA 08/12/2008  . DEHYDRATION 08/12/2008  . DEPRESSION/ANXIETY 08/12/2008  . ALCOHOL ABUSE  08/12/2008  . HYPERTENSION 08/12/2008  . [facility] 08/12/2008  . GASTROESOPHAGEAL REFLUX DISEASE, CHRONIC 08/12/2008  . FEVER UNSPECIFIED 08/12/2008  . VOMITING 08/12/2008  . DIARRHEA 08/12/2008   Guadelupe Sabin, OTR/L  [phone] 05/07/2018, 2:28 PM  [facility] [address] [place], [place], [address] Phone: [phone]   Fax:  [phone]  Name: JERVIS TRAPANI MRN: [id number] Date of Birth: 08-26-1960

## 2018-05-09 ENCOUNTER — Encounter (HOSPITAL_COMMUNITY): Payer: Self-pay | Admitting: Occupational Therapy

## 2018-05-09 ENCOUNTER — Ambulatory Visit (HOSPITAL_COMMUNITY): Payer: BLUE CROSS/BLUE SHIELD | Admitting: Occupational Therapy

## 2018-05-09 DIAGNOSIS — M25511 Pain in right shoulder: Secondary | ICD-10-CM

## 2018-05-09 DIAGNOSIS — R29898 Other symptoms and signs involving the musculoskeletal system: Secondary | ICD-10-CM

## 2018-05-09 DIAGNOSIS — M25611 Stiffness of right shoulder, not elsewhere classified: Secondary | ICD-10-CM | POA: Diagnosis not present

## 2018-05-09 NOTE — Therapy (Signed)
[place], [place], [address] Phone: [phone]   Fax:  [phone]  Occupational Therapy Treatment  Patient Details  Name: Ryan Hickman MRN: [id number] Date of Birth: 10-27-1960 Referring Provider (OT): Dr. Arther Abbott   Encounter Date: 05/09/2018  OT End of Session - 05/09/18 1458    Visit Number  7    Number of Visits  16    Date for OT Re-Evaluation  06/17/18   mini-reassessment 05/16/2018   Authorization Type  [employer] $25 copay     Authorization Time Period  60 visit limit with [place] - Visit Number  5    Authorization - Number of Visits  60    OT Start Time  1324    OT Stop Time  1431    OT Time Calculation (min)  43 min    Activity Tolerance  Patient tolerated treatment well    Behavior During Therapy  [facility] for tasks assessed/performed       Past Medical History:  Diagnosis Date  . Acid reflux   . Arthritis   . Childhood asthma    as child  . Depression   . History of ETOH abuse   . Hyperlipidemia   . Hypertension   . Insomnia   . Sleep apnea    uses CPAP, cannot tolerate  . Urticaria     Past Surgical History:  Procedure Laterality Date  . COLONOSCOPY  May 2010   Dr. Oneida Alar: simple adenoma, normal colon. Repeat 2020  . EAR CYST EXCISION N/A 11/14/2013   Procedure: EXCISION SEBACEOUS CYST CHEST WALL;  Surgeon: Jamesetta So, MD;  Location: [facility];  Service: General;  Laterality: N/A;  . KNEE ARTHROSCOPY WITH MEDIAL MENISECTOMY Left 08/24/2016   Procedure: KNEE ARTHROSCOPY WITH MEDIAL MENISECTOMY;  Surgeon: Carole Civil, MD;  Location: [facility];  Service: Orthopedics;  Laterality: Left;  . KNEE SURGERY Right 1992  . SHOULDER OPEN ROTATOR CUFF REPAIR Right 03/07/2018   Procedure: ROTATOR CUFF REPAIR SHOULDER OPEN with distal clavicle excison superior capsular reconstruction;  Surgeon: Carole Civil, MD;  Location: [facility];  Service: Orthopedics;  Laterality: Right;   . UPPER GASTROINTESTINAL ENDOSCOPY  May 2010   Dr. Oneida Alar: normal    There were no vitals filed for this visit.  Subjective Assessment - 05/09/18 1348    Subjective   S: It was hurting last night for some reason.     Currently in Pain?  No/denies         [facility] OT Assessment - 05/09/18 1347      Assessment   Medical Diagnosis  s/p right RC repair      Precautions   Precautions  Shoulder    Type of Shoulder Precautions  Sitting/standing: No lifting arm past 90 degrees. Progress as tolerated in supine. No lifting over 5#.                OT Treatments/Exercises (OP) - 05/09/18 1351      Exercises   Exercises  Shoulder      Shoulder Exercises: Supine   Protraction  PROM;5 reps;AROM;15 reps    Horizontal ABduction  PROM;5 reps;AROM;15 reps    External Rotation  PROM;5 reps;AROM;15 reps    Internal Rotation  PROM;5 reps;AROM;15 reps    Flexion  PROM;5 reps;AROM;15 reps    ABduction  PROM;5 reps;AROM;15 reps      Shoulder Exercises: Standing   Protraction  AAROM;15 reps  Horizontal ABduction  AAROM;15 reps    External Rotation  AAROM;15 reps    Internal Rotation  AAROM;15 reps    Flexion  AAROM;15 reps    ABduction  AAROM;15 reps    Extension  Theraband;15 reps    Theraband Level (Shoulder Extension)  Level 2 (Red)    Row  Theraband;15 reps    Theraband Level (Shoulder Row)  Level 2 (Red)    Retraction  Theraband;15 reps    Theraband Level (Shoulder Retraction)  Level 2 (Red)      Shoulder Exercises: ROM/Strengthening   UBE (Upper Arm Bike)  Level 1 2' forward 2' reverse   pace: 4.5-5.0   Proximal Shoulder Strengthening, Supine  10X each no rest breaks    Proximal Shoulder Strengthening, Seated  12X each, no rest breaks    Other ROM/Strengthening Exercises  90 degrees on door-wash wash proximal shoulder strengthening, 1' flexion, 1' abduction      Manual Therapy   Manual Therapy  Myofascial release    Manual therapy comments  Manual therapy completed  prior to exercises.     Myofascial Release  Myofascial release and manual stretching completed to right upper arm, trapezius, and scapularis region to decrease fascial restrictions and increase joint mobility needed for functional tasks.                OT Short Term Goals - 04/23/18 1232      OT SHORT TERM GOAL #1   Title  Pt will be provided with and educated on HEP to improve functional use of RUE as dominant during ADL completion.     Time  4    Period  Weeks    Status  On-going      OT SHORT TERM GOAL #2   Title  Pt will decrease pain in RUE to 4/10  to improve ability to sleep at night for 5 consecutive hours or greater.     Time  4    Period  Weeks    Status  On-going      OT SHORT TERM GOAL #3   Title  Pt will improve RUE A/ROM to [facility] to improve ability to use RUE as assist during dressing tasks.     Time  4    Period  Weeks    Status  On-going        OT Long Term Goals - 04/23/18 1232      OT LONG TERM GOAL #1   Title  Pt will return to highest level of functioning using RUE as dominant during all daily and work tasks.     Time  8    Period  Weeks    Status  On-going      OT LONG TERM GOAL #2   Title  Pt will decrease pain to 2/10 or less in RUE to improve mobility required for ADL task completion.     Time  8    Period  Weeks    Status  On-going      OT LONG TERM GOAL #3   Title  Pt will improve RUE A/ROM to WNL to improve mobility required for reaching overhead.     Time  8    Period  Weeks    Status  On-going      OT LONG TERM GOAL #4   Title  Pt will decrease fascial restrictions in RUE to minimal amounts or less to improve flexibility required for functional reaching tasks.  Time  8    Period  Weeks    Status  On-going      OT LONG TERM GOAL #5   Title  Pt will improve strength in RUE to 5/5 to increase ability to perform work tasks including overhead lifting.     Time  8    Period  Weeks    Status  On-going            Plan -  05/09/18 1428    Clinical Impression Statement  A: Continued with manual therapy to address fascial restrictions. Increased all repetitions to 20, added scapular theraband retraction and UBE today. Pt demonstrates good ROM and form during exercises, mod fatigue during sustained tasks such as proximal shoulder strengthening at doorway. Verbal cuing for form and technique during tasks.     Plan  P: progress to A/ROM in standing keeping ROM limit at 90 degrees.        Patient will benefit from skilled therapeutic intervention in order to improve the following deficits and impairments:  Decreased range of motion, Impaired flexibility, Decreased activity tolerance, Increased fascial restrictions, Impaired UE functional use, Pain, Decreased strength  Visit Diagnosis: Acute pain of right shoulder  Stiffness of right shoulder, not elsewhere classified  Other symptoms and signs involving the musculoskeletal system    Problem List Patient Active Problem List   Diagnosis Date Noted  . S/P right rotator cuff repair 03/07/18 03/08/2018  . Traumatic complete tear of right rotator cuff   . Arthrosis of right acromioclavicular joint   . Osteoarthritis of left knee 06/26/2013  . Derangement of posterior horn of medial meniscus 06/26/2013  . Left knee pain 06/26/2013  . Insomnia 04/20/2011  . OSA (obstructive sleep apnea) 04/20/2011  . OBESITY 12/20/2009  . TRANSAMINASES, SERUM, ELEVATED 12/20/2009  . RECTAL BLEEDING 08/13/2008  . HYPERCHOLESTEROLEMIA 08/12/2008  . DEHYDRATION 08/12/2008  . DEPRESSION/ANXIETY 08/12/2008  . ALCOHOL ABUSE 08/12/2008  . HYPERTENSION 08/12/2008  . [facility] 08/12/2008  . GASTROESOPHAGEAL REFLUX DISEASE, CHRONIC 08/12/2008  . FEVER UNSPECIFIED 08/12/2008  . VOMITING 08/12/2008  . DIARRHEA 08/12/2008   Guadelupe Sabin, OTR/L  [phone] 05/09/2018, 2:58 PM  [facility] [address] [place], [place], [address] Phone:  [phone]   Fax:  [phone]  Name: Ryan Hickman MRN: [id number] Date of Birth: [birth year]

## 2018-05-10 ENCOUNTER — Telehealth (HOSPITAL_COMMUNITY): Payer: Self-pay | Admitting: Occupational Therapy

## 2018-05-10 NOTE — Telephone Encounter (Signed)
L/m to cx or r/s this apptment, ask pt to call us back and let us know what they want to do. NF1/17/2020

## 2018-05-14 ENCOUNTER — Telehealth (HOSPITAL_COMMUNITY): Payer: Self-pay | Admitting: Occupational Therapy

## 2018-05-14 ENCOUNTER — Ambulatory Visit (HOSPITAL_COMMUNITY): Payer: BLUE CROSS/BLUE SHIELD | Admitting: Occupational Therapy

## 2018-05-14 ENCOUNTER — Encounter (HOSPITAL_COMMUNITY): Payer: Self-pay | Admitting: Occupational Therapy

## 2018-05-14 DIAGNOSIS — M25511 Pain in right shoulder: Secondary | ICD-10-CM | POA: Diagnosis not present

## 2018-05-14 DIAGNOSIS — M25611 Stiffness of right shoulder, not elsewhere classified: Secondary | ICD-10-CM | POA: Diagnosis not present

## 2018-05-14 DIAGNOSIS — R29898 Other symptoms and signs involving the musculoskeletal system: Secondary | ICD-10-CM

## 2018-05-14 NOTE — Patient Instructions (Signed)
Repeat all exercises 10-15 times, 1-2 times per day.  1) Shoulder Protraction    Begin with elbows by your side, slowly "punch" straight out in front of you.      2) Shoulder Flexion  Supine:     Standing:         Begin with arms at your side with thumbs pointed up, slowly raise both arms up and forward towards overhead.               3) Horizontal abduction/adduction  Supine:   Standing:           Begin with arms straight out in front of you, bring out to the side in at "T" shape. Keep arms straight entire time.                 4) Internal & External Rotation    *No band* -Stand with elbows at the side and elbows bent 90 degrees. Move your forearms away from your body, then bring back inward toward the body.     5) Shoulder Abduction  Supine:     Standing:       Lying on your back begin with your arms flat on the table next to your side. Slowly move your arms out to the side so that they go overhead, in a jumping jack or snow angel movement.      (Home) Extension: Isometric / Bilateral Arm Retraction - Sitting   Facing anchor, hold hands and elbow at shoulder height, with elbow bent.  Pull arms back to squeeze shoulder blades together. Repeat 10-15 times. 1-3 times/day.   (Clinic) Extension / Flexion (Assist)   Face anchor, pull arms back, keeping elbow straight, and squeze shoulder blades together. Repeat 10-15 times. 1-3 times/day.   Copyright  V[employer]. All rights reserved.   (Home) Retraction: Row - Bilateral (Anchor)   Facing anchor, arms reaching forward, pull hands toward stomach, keeping elbows bent and at your sides and pinching shoulder blades together. Repeat 10-15 times. 1-3 times/day.   Copyright  VHI. All rights reserved.

## 2018-05-14 NOTE — Telephone Encounter (Signed)
Pt understands that this apptment 05/30/2018 was cx due to OT not working this date and he did not want to r/s

## 2018-05-14 NOTE — Therapy (Signed)
[place], [place], [address] Phone: [phone]   Fax:  [phone]  Occupational Therapy Treatment  Patient Details  Name: Ryan Hickman MRN: [id number] Date of Birth: [birth year] Referring Provider (OT): Dr. Arther Abbott   Encounter Date: 05/14/2018  OT End of Session - 05/14/18 1512    Visit Number  8    Number of Visits  16    Date for OT Re-Evaluation  06/17/18   mini-reassessment 05/16/2018   Authorization Type  [employer] $25 copay     Authorization Time Period  60 visit limit with [employer]    Authorization - Visit Number  6    Authorization - Number of Visits  60    OT Start Time  9024    OT Stop Time  1512    OT Time Calculation (min)  39 min    Activity Tolerance  Patient tolerated treatment well    Behavior During Therapy  [facility] for tasks assessed/performed       Past Medical History:  Diagnosis Date  . Acid reflux   . Arthritis   . Childhood asthma    as child  . Depression   . History of ETOH abuse   . Hyperlipidemia   . Hypertension   . Insomnia   . Sleep apnea    uses CPAP, cannot tolerate  . Urticaria     Past Surgical History:  Procedure Laterality Date  . COLONOSCOPY  May 2010   Dr. Oneida Alar: simple adenoma, normal colon. Repeat 2020  . EAR CYST EXCISION N/A 11/14/2013   Procedure: EXCISION SEBACEOUS CYST CHEST WALL;  Surgeon: Jamesetta So, MD;  Location: [facility];  Service: General;  Laterality: N/A;  . KNEE ARTHROSCOPY WITH MEDIAL MENISECTOMY Left 08/24/2016   Procedure: KNEE ARTHROSCOPY WITH MEDIAL MENISECTOMY;  Surgeon: Carole Civil, MD;  Location: [facility];  Service: Orthopedics;  Laterality: Left;  . KNEE SURGERY Right 1992  . SHOULDER OPEN ROTATOR CUFF REPAIR Right 03/07/2018   Procedure: ROTATOR CUFF REPAIR SHOULDER OPEN with distal clavicle excison superior capsular reconstruction;  Surgeon: Carole Civil, MD;  Location: [facility];  Service: Orthopedics;  Laterality: Right;   . UPPER GASTROINTESTINAL ENDOSCOPY  May 2010   Dr. Oneida Alar: normal    There were no vitals filed for this visit.  Subjective Assessment - 05/14/18 1433    Subjective   S: I try to move it around so it won't tighten up.     Currently in Pain?  No/denies         [facility] OT Assessment - 05/14/18 1433      Assessment   Medical Diagnosis  s/p right RC repair      Precautions   Precautions  Shoulder    Type of Shoulder Precautions  Sitting/standing: No lifting arm past 90 degrees. Progress as tolerated in supine. No lifting over 5#.                OT Treatments/Exercises (OP) - 05/14/18 1435      Exercises   Exercises  Shoulder      Shoulder Exercises: Supine   Protraction  PROM;5 reps;AROM;15 reps    Horizontal ABduction  PROM;5 reps;AROM;15 reps    External Rotation  PROM;5 reps;AROM;15 reps   abducted   Internal Rotation  PROM;5 reps;AROM;15 reps   abducted   Flexion  PROM;5 reps;AROM;15 reps    ABduction  PROM;5 reps;AROM;15 reps      Shoulder Exercises:  Standing   Protraction  AROM;12 reps    Horizontal ABduction  AROM;12 reps    External Rotation  AROM;12 reps    Internal Rotation  AROM;12 reps    Flexion  AROM;12 reps    ABduction  AROM;12 reps    Extension  Theraband;15 reps    Theraband Level (Shoulder Extension)  Level 2 (Red)    Row  Theraband;15 reps    Theraband Level (Shoulder Row)  Level 2 (Red)    Retraction  Theraband;15 reps    Theraband Level (Shoulder Retraction)  Level 2 (Red)      Shoulder Exercises: ROM/Strengthening   Proximal Shoulder Strengthening, Supine  15X each no rest breaks    Proximal Shoulder Strengthening, Seated  12X each, no rest breaks      Manual Therapy   Manual Therapy  Myofascial release    Manual therapy comments  Manual therapy completed prior to exercises.     Myofascial Release  Myofascial release and manual stretching completed to right upper arm, trapezius, and scapularis region to decrease fascial  restrictions and increase joint mobility needed for functional tasks.              OT Education - 05/14/18 1504    Education Details  A/ROM exercises supine and standing-keeping at or below 90 degrees in standing, scapular theraband-red    Person(s) Educated  Patient    Methods  Explanation;Demonstration;Handout    Comprehension  Verbalized understanding;Returned demonstration       OT Short Term Goals - 04/23/18 1232      OT SHORT TERM GOAL #1   Title  Pt will be provided with and educated on HEP to improve functional use of RUE as dominant during ADL completion.     Time  4    Period  Weeks    Status  On-going      OT SHORT TERM GOAL #2   Title  Pt will decrease pain in RUE to 4/10  to improve ability to sleep at night for 5 consecutive hours or greater.     Time  4    Period  Weeks    Status  On-going      OT SHORT TERM GOAL #3   Title  Pt will improve RUE A/ROM to [facility] to improve ability to use RUE as assist during dressing tasks.     Time  4    Period  Weeks    Status  On-going        OT Long Term Goals - 04/23/18 1232      OT LONG TERM GOAL #1   Title  Pt will return to highest level of functioning using RUE as dominant during all daily and work tasks.     Time  8    Period  Weeks    Status  On-going      OT LONG TERM GOAL #2   Title  Pt will decrease pain to 2/10 or less in RUE to improve mobility required for ADL task completion.     Time  8    Period  Weeks    Status  On-going      OT LONG TERM GOAL #3   Title  Pt will improve RUE A/ROM to WNL to improve mobility required for reaching overhead.     Time  8    Period  Weeks    Status  On-going      OT LONG TERM GOAL #4   Title  Pt will decrease fascial restrictions in RUE to minimal amounts or less to improve flexibility required for functional reaching tasks.     Time  8    Period  Weeks    Status  On-going      OT LONG TERM GOAL #5   Title  Pt will improve strength in RUE to 5/5 to increase  ability to perform work tasks including overhead lifting.     Time  8    Period  Weeks    Status  On-going            Plan - 05/14/18 1513    Clinical Impression Statement  A: Continued with manual therapy to address fascial restrictions in upper arm and trapezius limiting ROM, progressed to A/ROM in standing within 90 degree limit. Added A/ROM and scapular theraband to HEP. Pt reports increased soreness with A/ROM versus AA/ROM. Verbal cuing for form and technique.     Plan  P: Continue with all A/ROM, follow up on HEP, mini-reassessment and FOTO       Patient will benefit from skilled therapeutic intervention in order to improve the following deficits and impairments:  Decreased range of motion, Impaired flexibility, Decreased activity tolerance, Increased fascial restrictions, Impaired UE functional use, Pain, Decreased strength  Visit Diagnosis: Acute pain of right shoulder  Stiffness of right shoulder, not elsewhere classified  Other symptoms and signs involving the musculoskeletal system    Problem List Patient Active Problem List   Diagnosis Date Noted  . S/P right rotator cuff repair 03/07/18 03/08/2018  . Traumatic complete tear of right rotator cuff   . Arthrosis of right acromioclavicular joint   . Osteoarthritis of left knee 06/26/2013  . Derangement of posterior horn of medial meniscus 06/26/2013  . Left knee pain 06/26/2013  . Insomnia 04/20/2011  . OSA (obstructive sleep apnea) 04/20/2011  . OBESITY 12/20/2009  . TRANSAMINASES, SERUM, ELEVATED 12/20/2009  . RECTAL BLEEDING 08/13/2008  . HYPERCHOLESTEROLEMIA 08/12/2008  . DEHYDRATION 08/12/2008  . DEPRESSION/ANXIETY 08/12/2008  . ALCOHOL ABUSE 08/12/2008  . HYPERTENSION 08/12/2008  . [facility] 08/12/2008  . GASTROESOPHAGEAL REFLUX DISEASE, CHRONIC 08/12/2008  . FEVER UNSPECIFIED 08/12/2008  . VOMITING 08/12/2008  . DIARRHEA 08/12/2008   Guadelupe Sabin, OTR/L  [phone] 05/14/2018, 3:14 PM  [facility] [address] [place], [place], [address] Phone: [phone]   Fax:  [phone]  Name: Ryan Hickman MRN: [id number] Date of Birth: 02/18/1961

## 2018-05-16 ENCOUNTER — Ambulatory Visit (HOSPITAL_COMMUNITY): Payer: BLUE CROSS/BLUE SHIELD | Admitting: Occupational Therapy

## 2018-05-16 ENCOUNTER — Other Ambulatory Visit: Payer: Self-pay | Admitting: Orthopedic Surgery

## 2018-05-16 ENCOUNTER — Encounter (HOSPITAL_COMMUNITY): Payer: Self-pay | Admitting: Occupational Therapy

## 2018-05-16 DIAGNOSIS — R29898 Other symptoms and signs involving the musculoskeletal system: Secondary | ICD-10-CM | POA: Diagnosis not present

## 2018-05-16 DIAGNOSIS — M25511 Pain in right shoulder: Secondary | ICD-10-CM

## 2018-05-16 DIAGNOSIS — M25611 Stiffness of right shoulder, not elsewhere classified: Secondary | ICD-10-CM | POA: Diagnosis not present

## 2018-05-16 MED ORDER — HYDROCODONE-ACETAMINOPHEN 7.5-325 MG PO TABS: 1.0000 | ORAL_TABLET | Freq: Four times a day (QID) | ORAL | 0 refills | Status: DC | PRN

## 2018-05-16 NOTE — Telephone Encounter (Signed)
Hydrocodone-Acetaminophen  7.5/325 mg Qty 42 Tablets Take 1 tablet by mouth every 6 (six) hours as needed for moderate pain.   PATIENT USES [facility]

## 2018-05-16 NOTE — Therapy (Signed)
[place], [place], [address] Phone: [phone]   Fax:  [phone]  Occupational Therapy Treatment  Patient Details  Name: Ryan Hickman MRN: [id number] Date of Birth: October 24, 1960 Referring Provider (OT): Dr. Arther Abbott   Encounter Date: 05/16/2018  OT End of Session - 05/16/18 1324    Visit Number  9    Number of Visits  16    Date for OT Re-Evaluation  06/17/18    Authorization Type  [employer] $25 copay     Authorization Time Period  60 visit limit with [employer]    Authorization - Visit Number  7    Authorization - Number of Visits  60    OT Start Time  1119    OT Stop Time  1200    OT Time Calculation (min)  41 min    Activity Tolerance  Patient tolerated treatment well    Behavior During Therapy  [facility] for tasks assessed/performed       Past Medical History:  Diagnosis Date  . Acid reflux   . Arthritis   . Childhood asthma    as child  . Depression   . History of ETOH abuse   . Hyperlipidemia   . Hypertension   . Insomnia   . Sleep apnea    uses CPAP, cannot tolerate  . Urticaria     Past Surgical History:  Procedure Laterality Date  . COLONOSCOPY  May 2010   Dr. Oneida Alar: simple adenoma, normal colon. Repeat 2020  . EAR CYST EXCISION N/A 11/14/2013   Procedure: EXCISION SEBACEOUS CYST CHEST WALL;  Surgeon: Jamesetta So, MD;  Location: [facility];  Service: General;  Laterality: N/A;  . KNEE ARTHROSCOPY WITH MEDIAL MENISECTOMY Left 08/24/2016   Procedure: KNEE ARTHROSCOPY WITH MEDIAL MENISECTOMY;  Surgeon: Carole Civil, MD;  Location: [facility];  Service: Orthopedics;  Laterality: Left;  . KNEE SURGERY Right 1992  . SHOULDER OPEN ROTATOR CUFF REPAIR Right 03/07/2018   Procedure: ROTATOR CUFF REPAIR SHOULDER OPEN with distal clavicle excison superior capsular reconstruction;  Surgeon: Carole Civil, MD;  Location: [facility];  Service: Orthopedics;  Laterality: Right;  . UPPER GASTROINTESTINAL  ENDOSCOPY  May 2010   Dr. Oneida Alar: normal    There were no vitals filed for this visit.  Subjective Assessment - 05/16/18 1121    Subjective   S: It's a little more sore today.     Currently in Pain?  Yes    Pain Score  3     Pain Location  Shoulder    Pain Orientation  Right    Pain Descriptors / Indicators  Aching;Sore    Pain Type  Acute pain    Pain Radiating Towards  N/A    Pain Onset  In the past 7 days    Pain Frequency  Constant    Aggravating Factors   movement, exercise    Pain Relieving Factors  rest, stretching    Effect of Pain on Daily Activities  min effect on ADLs         [facility] OT Assessment - 05/16/18 1121      Assessment   Medical Diagnosis  s/p right RC repair      Precautions   Precautions  Shoulder    Type of Shoulder Precautions  Sitting/standing: No lifting arm past 90 degrees. Progress as tolerated in supine. No lifting over 5#.       Observation/Other Assessments   Focus on  Therapeutic Outcomes (FOTO)   66/100   55/100 previous     Palpation   Palpation comment  Minimal fascial restrictions at anterior shoulder, trapezius, and scapularis regions      AROM   Overall AROM Comments  Assessed seated, er/IR adducted    AROM Assessment Site  Shoulder    Right/Left Shoulder  Right    Right Shoulder Flexion  160 Degrees   126 previous   Right Shoulder ABduction  170 Degrees   136 previous   Right Shoulder Internal Rotation  90 Degrees   90 previous   Right Shoulder External Rotation  70 Degrees   same as previous     PROM   Overall PROM Comments  Assessed supine, er/IR adducted    PROM Assessment Site  Shoulder    Right/Left Shoulder  Right    Right Shoulder Flexion  169 Degrees   148 previous   Right Shoulder ABduction  180 Degrees   same as previous   Right Shoulder Internal Rotation  90 Degrees   same as previous   Right Shoulder External Rotation  70 Degrees   40 previous     Strength   Overall Strength Comments  Assessed seated,  er/IR adducted    Strength Assessment Site  Shoulder    Right/Left Shoulder  Right    Right Shoulder Flexion  5/5   4-/5 previous   Right Shoulder ABduction  4-/5   3/5 previous   Right Shoulder Internal Rotation  5/5   4+/5 previous   Right Shoulder External Rotation  4+/5   4/5 previous              OT Treatments/Exercises (OP) - 05/16/18 1123      Exercises   Exercises  Shoulder      Shoulder Exercises: Supine   Protraction  PROM;5 reps;Strengthening;12 reps    Protraction Weight (lbs)  1    Horizontal ABduction  PROM;5 reps;Strengthening;12 reps    Horizontal ABduction Weight (lbs)  1    External Rotation  PROM;5 reps;Strengthening;12 reps   abducted   External Rotation Weight (lbs)  1    Internal Rotation  PROM;5 reps;Strengthening;12 reps   abducted   Internal Rotation Weight (lbs)  1    Flexion  PROM;5 reps;Strengthening;12 reps    Shoulder Flexion Weight (lbs)  1    ABduction  PROM;5 reps;Strengthening;12 reps    Shoulder ABduction Weight (lbs)  1      Shoulder Exercises: Standing   Protraction  AROM;12 reps    Horizontal ABduction  AROM;12 reps    External Rotation  AROM;12 reps    Internal Rotation  AROM;12 reps    Flexion  AROM;12 reps    ABduction  AROM;12 reps    Extension  Theraband;12 reps    Theraband Level (Shoulder Extension)  Level 3 (Green)    Row  [employer] reps    Theraband Level (Shoulder Row)  Level 3 (Green)    Retraction  Theraband;12 reps    Theraband Level (Shoulder Retraction)  Level 3 (Green)      Shoulder Exercises: ROM/Strengthening   Proximal Shoulder Strengthening, Seated  15X each, no rest breaks    Ball on Wall  1' flexion 1' abduction      Manual Therapy   Manual Therapy  Myofascial release    Manual therapy comments  Manual therapy completed prior to exercises.     Myofascial Release  Myofascial release and manual stretching completed to right upper arm,  trapezius, and scapularis region to decrease fascial  restrictions and increase joint mobility needed for functional tasks.                OT Short Term Goals - 05/16/18 1325      OT SHORT TERM GOAL #1   Title  Pt will be provided with and educated on HEP to improve functional use of RUE as dominant during ADL completion.     Time  4    Period  Weeks    Status  On-going      OT SHORT TERM GOAL #2   Title  Pt will decrease pain in RUE to 4/10  to improve ability to sleep at night for 5 consecutive hours or greater.     Time  4    Period  Weeks    Status  Achieved      OT SHORT TERM GOAL #3   Title  Pt will improve RUE A/ROM to [facility] to improve ability to use RUE as assist during dressing tasks.     Time  4    Period  Weeks    Status  Achieved        OT Long Term Goals - 05/16/18 1325      OT LONG TERM GOAL #1   Title  Pt will return to highest level of functioning using RUE as dominant during all daily and work tasks.     Time  8    Period  Weeks    Status  On-going      OT LONG TERM GOAL #2   Title  Pt will decrease pain to 2/10 or less in RUE to improve mobility required for ADL task completion.     Time  8    Period  Weeks    Status  On-going      OT LONG TERM GOAL #3   Title  Pt will improve RUE A/ROM to WNL to improve mobility required for reaching overhead.     Time  8    Period  Weeks    Status  Partially Met      OT LONG TERM GOAL #4   Title  Pt will decrease fascial restrictions in RUE to minimal amounts or less to improve flexibility required for functional reaching tasks.     Time  8    Period  Weeks    Status  Achieved      OT LONG TERM GOAL #5   Title  Pt will improve strength in RUE to 5/5 to increase ability to perform work tasks including overhead lifting.     Time  8    Period  Weeks    Status  Partially Met            Plan - 05/16/18 1325    Clinical Impression Statement  A: Mini-reassessment completed this session, pt has met 2/3 STGs, 1/5 LTGs, and partially met 2 additional  LTGs. Pt is making great progress with ROM, strength, pain, and functional use of RUE. During treatments pt limited to 90 degree A/ROM per MD orders, did begin strengthening in supine today using 1# weight. Also progressed to green scapular theraband and provided to update HEP as well as adding ball on wall today. Verbal cuing for form and technique.     Occupational performance deficits (Please refer to evaluation for details):  ADL's;IADL's;Rest and Sleep;Work    Rehab Potential  Good    OT Treatment/Interventions  Self-care/ADL training;Moist [employer];Therapeutic  activities;Ultrasound;Therapeutic exercise;Cryotherapy;Passive range of motion;Electrical Stimulation;Manual Therapy;Patient/family education    Plan  P: Continue with strengthening in supine, A/ROM in standing to 50% range       Patient will benefit from skilled therapeutic intervention in order to improve the following deficits and impairments:  Decreased range of motion, Impaired flexibility, Decreased activity tolerance, Increased fascial restrictions, Impaired UE functional use, Pain, Decreased strength  Visit Diagnosis: Acute pain of right shoulder  Stiffness of right shoulder, not elsewhere classified  Other symptoms and signs involving the musculoskeletal system    Problem List Patient Active Problem List   Diagnosis Date Noted  . S/P right rotator cuff repair 03/07/18 03/08/2018  . Traumatic complete tear of right rotator cuff   . Arthrosis of right acromioclavicular joint   . Osteoarthritis of left knee 06/26/2013  . Derangement of posterior horn of medial meniscus 06/26/2013  . Left knee pain 06/26/2013  . Insomnia 04/20/2011  . OSA (obstructive sleep apnea) 04/20/2011  . OBESITY 12/20/2009  . TRANSAMINASES, SERUM, ELEVATED 12/20/2009  . RECTAL BLEEDING 08/13/2008  . HYPERCHOLESTEROLEMIA 08/12/2008  . DEHYDRATION 08/12/2008  . DEPRESSION/ANXIETY 08/12/2008  . ALCOHOL ABUSE 08/12/2008  . HYPERTENSION 08/12/2008   . [facility] 08/12/2008  . GASTROESOPHAGEAL REFLUX DISEASE, CHRONIC 08/12/2008  . FEVER UNSPECIFIED 08/12/2008  . VOMITING 08/12/2008  . DIARRHEA 08/12/2008   Guadelupe Sabin, OTR/L  [phone] 05/16/2018, 1:42 PM  [facility] [address] [place], [place], [address] Phone: [phone]   Fax:  [phone]  Name: Ryan Hickman MRN: [id number] Date of Birth: 1960-04-29

## 2018-05-21 ENCOUNTER — Ambulatory Visit (HOSPITAL_COMMUNITY): Payer: BLUE CROSS/BLUE SHIELD | Admitting: Occupational Therapy

## 2018-05-21 ENCOUNTER — Encounter (HOSPITAL_COMMUNITY): Payer: Self-pay | Admitting: Occupational Therapy

## 2018-05-21 DIAGNOSIS — R29898 Other symptoms and signs involving the musculoskeletal system: Secondary | ICD-10-CM

## 2018-05-21 DIAGNOSIS — M25611 Stiffness of right shoulder, not elsewhere classified: Secondary | ICD-10-CM

## 2018-05-21 DIAGNOSIS — M25511 Pain in right shoulder: Secondary | ICD-10-CM | POA: Diagnosis not present

## 2018-05-21 NOTE — Therapy (Addendum)
[place], [place], [address] Phone: [phone]   Fax:  [phone]  Occupational Therapy Treatment  Patient Details  Name: Ryan Hickman MRN: [id number] Date of Birth: 10/15/1960 Referring Provider (OT): Dr. Arther Abbott   Encounter Date: 05/21/2018  OT End of Session - 05/21/18 1111    Visit Number  10    Number of Visits  16    Date for OT Re-Evaluation  06/17/18    Authorization Type  [employer] $25 copay     Authorization Time Period  60 visit limit with [place] - Visit Number  8    Authorization - Number of Visits  60    OT Start Time  1032    OT Stop Time  1114    OT Time Calculation (min)  42 min    Activity Tolerance  Patient tolerated treatment well    Behavior During Therapy  [facility] for tasks assessed/performed       Past Medical History:  Diagnosis Date  . Acid reflux   . Arthritis   . Childhood asthma    as child  . Depression   . History of ETOH abuse   . Hyperlipidemia   . Hypertension   . Insomnia   . Sleep apnea    uses CPAP, cannot tolerate  . Urticaria     Past Surgical History:  Procedure Laterality Date  . COLONOSCOPY  May 2010   Dr. Oneida Alar: simple adenoma, normal colon. Repeat 2020  . EAR CYST EXCISION N/A 11/14/2013   Procedure: EXCISION SEBACEOUS CYST CHEST WALL;  Surgeon: Jamesetta So, MD;  Location: [facility];  Service: General;  Laterality: N/A;  . KNEE ARTHROSCOPY WITH MEDIAL MENISECTOMY Left 08/24/2016   Procedure: KNEE ARTHROSCOPY WITH MEDIAL MENISECTOMY;  Surgeon: Carole Civil, MD;  Location: [facility];  Service: Orthopedics;  Laterality: Left;  . KNEE SURGERY Right 1992  . SHOULDER OPEN ROTATOR CUFF REPAIR Right 03/07/2018   Procedure: ROTATOR CUFF REPAIR SHOULDER OPEN with distal clavicle excison superior capsular reconstruction;  Surgeon: Carole Civil, MD;  Location: [facility];  Service: Orthopedics;  Laterality: Right;  . UPPER GASTROINTESTINAL  ENDOSCOPY  May 2010   Dr. Oneida Alar: normal    There were no vitals filed for this visit.  Subjective Assessment - 05/21/18 1032    Subjective   S: It gets sore towards the end of the day.     Currently in Pain?  No/denies         [facility] OT Assessment - 05/21/18 1031      Assessment   Medical Diagnosis  s/p right RC repair      Precautions   Precautions  Shoulder    Type of Shoulder Precautions  Sitting/standing: No lifting arm past 90 degrees. Progress as tolerated in supine. No lifting over 5#.                OT Treatments/Exercises (OP) - 05/21/18 1033      Exercises   Exercises  Shoulder      Shoulder Exercises: Supine   Protraction  PROM;5 reps;Strengthening;12 reps    Protraction Weight (lbs)  1    Horizontal ABduction  PROM;5 reps;Strengthening;12 reps    Horizontal ABduction Weight (lbs)  1    External Rotation  PROM;5 reps;Strengthening;12 reps   abducted   External Rotation Weight (lbs)  1    Internal Rotation  PROM;5 reps;Strengthening;12 reps   abducted  Internal Rotation Weight (lbs)  1    Flexion  PROM;5 reps;Strengthening;12 reps    Shoulder Flexion Weight (lbs)  1    ABduction  PROM;5 reps;Strengthening;10 reps    Shoulder ABduction Weight (lbs)  1    Diagonals  Strengthening;12 reps    Diagonals Weight (lbs)  1      Shoulder Exercises: Standing   Protraction  AROM;15 reps    Horizontal ABduction  AROM;15 reps;Theraband;10 reps    Theraband Level (Shoulder Horizontal ABduction)  Level 2 (Red)    External Rotation  AROM;15 reps;Theraband;10 reps    Theraband Level (Shoulder External Rotation)  Level 2 (Red)    Internal Rotation  AROM;15 reps    Flexion  AROM;15 reps    ABduction  AROM;15 reps    Extension  Theraband;15 reps    Theraband Level (Shoulder Extension)  Level 3 (Green)    Row  [employer] reps    Theraband Level (Shoulder Row)  Level 3 (Green)    Retraction  Theraband;15 reps    Theraband Level (Shoulder Retraction)  Level 3  (Green)      Shoulder Exercises: ROM/Strengthening   UBE (Upper Arm Bike)  Level 2 3' forward 3' reverse   pace: 4.5-5.5   Ball on Wall  1' flexion 1' abduction    Other ROM/Strengthening Exercises  red loop band: lateral wall slides, 10X      Manual Therapy   Manual Therapy  Myofascial release    Manual therapy comments  Manual therapy completed prior to exercises.     Myofascial Release  Myofascial release and manual stretching completed to right upper arm, trapezius, and scapularis region to decrease fascial restrictions and increase joint mobility needed for functional tasks.                OT Short Term Goals - 05/16/18 1325      OT SHORT TERM GOAL #1   Title  Pt will be provided with and educated on HEP to improve functional use of RUE as dominant during ADL completion.     Time  4    Period  Weeks    Status  On-going      OT SHORT TERM GOAL #2   Title  Pt will decrease pain in RUE to 4/10  to improve ability to sleep at night for 5 consecutive hours or greater.     Time  4    Period  Weeks    Status  Achieved      OT SHORT TERM GOAL #3   Title  Pt will improve RUE A/ROM to [facility] to improve ability to use RUE as assist during dressing tasks.     Time  4    Period  Weeks    Status  Achieved        OT Long Term Goals - 05/16/18 1325      OT LONG TERM GOAL #1   Title  Pt will return to highest level of functioning using RUE as dominant during all daily and work tasks.     Time  8    Period  Weeks    Status  On-going      OT LONG TERM GOAL #2   Title  Pt will decrease pain to 2/10 or less in RUE to improve mobility required for ADL task completion.     Time  8    Period  Weeks    Status  On-going      OT  LONG TERM GOAL #3   Title  Pt will improve RUE A/ROM to WNL to improve mobility required for reaching overhead.     Time  8    Period  Weeks    Status  Partially Met      OT LONG TERM GOAL #4   Title  Pt will decrease fascial restrictions in RUE to  minimal amounts or less to improve flexibility required for functional reaching tasks.     Time  8    Period  Weeks    Status  Achieved      OT LONG TERM GOAL #5   Title  Pt will improve strength in RUE to 5/5 to increase ability to perform work tasks including overhead lifting.     Time  8    Period  Weeks    Status  Partially Met            Plan - 05/21/18 1053    Clinical Impression Statement  A: Continued with manual therapy to address fascial restrictions in RUE. Continued with strengthening in supine, slight increase in pain with abduction, added diagonals without difficulty. Continued A/ROM and scapular strengthening in standing, added red loop band and red theraband strengthening for er and hoizontal abduction. Occasional fatigue during exercises, no additional increased pain. Verbal cuing for form and technique.     Plan  P: Continue with red theraband strengthening in standing, adhering to 50% range limitations.        Patient will benefit from skilled therapeutic intervention in order to improve the following deficits and impairments:  Decreased range of motion, Impaired flexibility, Decreased activity tolerance, Increased fascial restrictions, Impaired UE functional use, Pain, Decreased strength  Visit Diagnosis: Acute pain of right shoulder  Stiffness of right shoulder, not elsewhere classified  Other symptoms and signs involving the musculoskeletal system    Problem List Patient Active Problem List   Diagnosis Date Noted  . S/P right rotator cuff repair 03/07/18 03/08/2018  . Traumatic complete tear of right rotator cuff   . Arthrosis of right acromioclavicular joint   . Osteoarthritis of left knee 06/26/2013  . Derangement of posterior horn of medial meniscus 06/26/2013  . Left knee pain 06/26/2013  . Insomnia 04/20/2011  . OSA (obstructive sleep apnea) 04/20/2011  . OBESITY 12/20/2009  . TRANSAMINASES, SERUM, ELEVATED 12/20/2009  . RECTAL BLEEDING  08/13/2008  . HYPERCHOLESTEROLEMIA 08/12/2008  . DEHYDRATION 08/12/2008  . DEPRESSION/ANXIETY 08/12/2008  . ALCOHOL ABUSE 08/12/2008  . HYPERTENSION 08/12/2008  . [facility] 08/12/2008  . GASTROESOPHAGEAL REFLUX DISEASE, CHRONIC 08/12/2008  . FEVER UNSPECIFIED 08/12/2008  . VOMITING 08/12/2008  . DIARRHEA 08/12/2008   Guadelupe Sabin, OTR/L  [phone] 05/21/2018, 11:16 AM  [facility] [address] [place], [place], [address] Phone: [phone]   Fax:  [phone]  Name: Ryan Hickman MRN: [id number] Date of Birth: [birth year]

## 2018-05-22 ENCOUNTER — Telehealth (HOSPITAL_COMMUNITY): Payer: Self-pay | Admitting: Family Medicine

## 2018-05-22 NOTE — Telephone Encounter (Signed)
05/22/18  pt called to cx but no reason was given

## 2018-05-23 ENCOUNTER — Ambulatory Visit (HOSPITAL_COMMUNITY): Payer: BLUE CROSS/BLUE SHIELD | Admitting: Occupational Therapy

## 2018-05-23 ENCOUNTER — Emergency Department (HOSPITAL_COMMUNITY)
Admission: EM | Admit: 2018-05-23 | Discharge: 2018-05-24 | Disposition: A | Discharge status: Home / Self Care | Payer: BLUE CROSS/BLUE SHIELD | Attending: Emergency Medicine | Admitting: Emergency Medicine

## 2018-05-23 ENCOUNTER — Emergency Department (HOSPITAL_COMMUNITY): Payer: BLUE CROSS/BLUE SHIELD

## 2018-05-23 ENCOUNTER — Encounter (HOSPITAL_COMMUNITY): Payer: Self-pay | Admitting: Emergency Medicine

## 2018-05-23 ENCOUNTER — Other Ambulatory Visit: Payer: Self-pay

## 2018-05-23 DIAGNOSIS — Z79899 Other long term (current) drug therapy: Secondary | ICD-10-CM | POA: Insufficient documentation

## 2018-05-23 DIAGNOSIS — R079 Chest pain, unspecified: Secondary | ICD-10-CM

## 2018-05-23 DIAGNOSIS — F17210 Nicotine dependence, cigarettes, uncomplicated: Secondary | ICD-10-CM | POA: Diagnosis not present

## 2018-05-23 DIAGNOSIS — R072 Precordial pain: Secondary | ICD-10-CM | POA: Diagnosis not present

## 2018-05-23 DIAGNOSIS — I10 Essential (primary) hypertension: Secondary | ICD-10-CM | POA: Insufficient documentation

## 2018-05-23 DIAGNOSIS — E785 Hyperlipidemia, unspecified: Secondary | ICD-10-CM | POA: Diagnosis not present

## 2018-05-23 LAB — BASIC METABOLIC PANEL
ANION GAP: 13 (ref 5–15)
BUN: 30 mg/dL — ABNORMAL HIGH (ref 6–20)
CHLORIDE: 102 mmol/L (ref 98–111)
CO2: 23 mmol/L (ref 22–32)
Calcium: 9.4 mg/dL (ref 8.9–10.3)
Creatinine, Ser: 1.52 mg/dL — ABNORMAL HIGH (ref 0.61–1.24)
GFR calc non Af Amer: 50 mL/min — ABNORMAL LOW (ref >60)
GFR, EST AFRICAN AMERICAN: 59 mL/min — AB (ref >60)
Glucose, Bld: 98 mg/dL (ref 70–99)
Potassium: 3.4 mmol/L — ABNORMAL LOW (ref 3.5–5.1)
Sodium: 138 mmol/L (ref 135–145)

## 2018-05-23 LAB — POCT I-STAT TROPONIN I: Troponin i, poc: 0 ng/mL (ref 0.00–0.08)

## 2018-05-23 LAB — CBC
HEMATOCRIT: 47.2 % (ref 39.0–52.0)
Hemoglobin: 16.2 g/dL (ref 13.0–17.0)
MCH: 30.5 pg (ref 26.0–34.0)
MCHC: 34.3 g/dL (ref 30.0–36.0)
MCV: 88.9 fL (ref 80.0–100.0)
Platelets: 226 10*3/uL (ref 150–400)
RBC: 5.31 MIL/uL (ref 4.22–5.81)
RDW: 12.8 % (ref 11.5–15.5)
WBC: 13.5 10*3/uL — ABNORMAL HIGH (ref 4.0–10.5)
nRBC: 0 % (ref 0.0–0.2)

## 2018-05-23 LAB — TROPONIN I: Troponin I: <0.03 ng/mL (ref <0.03)

## 2018-05-23 MED ORDER — PREDNISONE 50 MG PO TABS
60.0000 mg | ORAL_TABLET | Freq: Once | ORAL | Status: AC
  Administered 2018-05-23: 60 mg via ORAL
  Filled 2018-05-23: qty 1

## 2018-05-23 MED ORDER — ALBUTEROL SULFATE HFA 108 (90 BASE) MCG/ACT IN AERS
2.0000 | INHALATION_SPRAY | Freq: Once | RESPIRATORY_TRACT | Status: AC
  Administered 2018-05-23: 2 via RESPIRATORY_TRACT
  Filled 2018-05-23: qty 6.7

## 2018-05-23 MED ORDER — LACTATED RINGERS IV BOLUS
1000.0000 mL | Freq: Once | INTRAVENOUS | Status: AC
  Administered 2018-05-23: 1000 mL via INTRAVENOUS

## 2018-05-23 MED ORDER — SODIUM CHLORIDE 0.9% FLUSH: 3.0000 mL | Freq: Once | INTRAVENOUS | Status: DC

## 2018-05-23 NOTE — ED Triage Notes (Signed)
Pt reports left sided chest pain x a few days with SOB, pt reports increased stress this week, no cardiac history, pain is intermittent from 2-5/10

## 2018-05-24 ENCOUNTER — Encounter: Payer: Self-pay | Admitting: Orthopedic Surgery

## 2018-05-24 ENCOUNTER — Ambulatory Visit: Payer: BLUE CROSS/BLUE SHIELD | Admitting: Orthopedic Surgery

## 2018-05-24 VITALS — BP 156/84 | HR 80 | Ht 70.0 in | Wt 255.0 lb

## 2018-05-24 DIAGNOSIS — Z9889 Other specified postprocedural states: Secondary | ICD-10-CM

## 2018-05-24 LAB — TROPONIN I: Troponin I: <0.03 ng/mL (ref <0.03)

## 2018-05-24 MED ORDER — HYDROCODONE-ACETAMINOPHEN 7.5-325 MG PO TABS: 1.0000 | ORAL_TABLET | Freq: Four times a day (QID) | ORAL | 0 refills | Status: DC | PRN

## 2018-05-24 NOTE — Progress Notes (Signed)
POSTOP VISIT  2-1/2 months postop  Chief Complaint  Patient presents with  . Post-op Follow-up    right shoulder 03/07/18     Ryan Hickman has progressed well he is moving his arm 150 degrees of flexion and internal rotation to the lower back level pain medication just on an as-needed basis  The patient lost his wife 3 days ago in a car accident  He is doing well considering  He has good strength in the rotator cuff at this point  03/07/2018  10:12 AM  PATIENT:  Ryan Hickman  58 y.o. 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  PROCEDURE:  Procedure(s): ROTATOR CUFF REPAIR SHOULDER OPEN with distal clavicle excison superior capsular reconstruction (Right)   Operative findings  Large rotator cuff tear with 3 cm retraction  Implants Arthrex suture anchors to medially and then the lateral row of 2 additional anchors plus augmentation graft  SURGEON:  Surgeon(s) and Role:    Carole Civil, MD - Primary   Encounter Diagnosis  Name Primary?  . S/P right rotator cuff repair 03/07/18 Yes    Continue therapy  Postoperative plan (Work, [employer],  Meds ordered this encounter  Medications  . HYDROcodone-acetaminophen (NORCO) 7.5-325 MG tablet    Sig: Take 1 tablet by mouth every 6 (six) hours as needed for moderate pain.    Dispense:  42 tablet    Refill:  0  ,FU)  Follow-up in 2 months

## 2018-05-24 NOTE — Patient Instructions (Signed)
Continue therapy   10 lb limit on weights

## 2018-05-24 NOTE — Discharge Instructions (Addendum)
Follow-up with cardiology in the next few days.  The contact information for Dr. Harl Bowie has been provided in this discharge summary for you to call and make these arrangements.  Return to the emergency department in the meantime if you develop worsening pain, worsening breathing, or other new and concerning symptoms.

## 2018-05-24 NOTE — ED Provider Notes (Signed)
Care assumed from Dr. Dayna Barker at shift change.  Patient presenting here with complaints of chest discomfort.  Patient awaiting a second troponin.  This test has returned and is negative.  Patient states that he feels much better.  He does seem to have quite a bit of stress and I suspect this may be the cause of his symptoms.  He has had stress test in the past that have been okay.  Patient may need a more in-depth study performed and will be referred to cardiology to have this discussion.   Veryl Speak, MD 05/24/18 [phone]

## 2018-05-27 ENCOUNTER — Encounter: Payer: Self-pay | Admitting: Cardiovascular Disease

## 2018-05-27 ENCOUNTER — Ambulatory Visit: Payer: 59 | Admitting: Orthopedic Surgery

## 2018-05-27 ENCOUNTER — Ambulatory Visit: Payer: BLUE CROSS/BLUE SHIELD | Admitting: Cardiovascular Disease

## 2018-05-27 ENCOUNTER — Ambulatory Visit: Payer: BLUE CROSS/BLUE SHIELD | Admitting: Internal Medicine

## 2018-05-27 VITALS — BP 146/76 | HR 74 | Ht 70.0 in | Wt 253.0 lb

## 2018-05-27 DIAGNOSIS — Z720 Tobacco use: Secondary | ICD-10-CM

## 2018-05-27 DIAGNOSIS — R079 Chest pain, unspecified: Secondary | ICD-10-CM | POA: Diagnosis not present

## 2018-05-27 DIAGNOSIS — G4733 Obstructive sleep apnea (adult) (pediatric): Secondary | ICD-10-CM | POA: Diagnosis not present

## 2018-05-27 DIAGNOSIS — Z9289 Personal history of other medical treatment: Secondary | ICD-10-CM

## 2018-05-27 DIAGNOSIS — I10 Essential (primary) hypertension: Secondary | ICD-10-CM | POA: Diagnosis not present

## 2018-05-27 NOTE — Progress Notes (Signed)
CARDIOLOGY CONSULT NOTE  Patient ID: Ryan Hickman MRN: [id number] DOB/AGE: 09/16/1960 58 y.o.  Admit date: (Not on file) Primary Physician: Scherrie Bateman Referring Physician: Scherrie Bateman  Reason for Consultation: Chest pain  HPI: Ryan Hickman is a 58 y.o. male who is being seen today for the evaluation of chest pain at the request of Jake Samples, [place]*.   Past medical history includes hypertension, sleep apnea, and tobacco abuse.  He was evaluated in the ED for chest pain on 05/24/2018.  I reviewed all relevant documentation, labs, and studies.  Troponins were normal.  Basic metabolic panel showed low potassium at 3.4.  Creatinine was elevated at 1.52.  It had been 0.84 two months prior. WBCs were elevated at 13.5.  Chest x-ray showed no active cardiopulmonary disease.  I personally reviewed the ECG which showed sinus rhythm with right bundle branch block and PACs.  He is here with his daughter, Eduard Clos, who lives in [place] and works for a [employer] there.  She is in [place] state graduate.  The patient told me that he lost his wife of nearly 31 years in a [place] [address] one week ago.  His chest discomfort developed after this.  Prior to this he has had episodic both left and right sided chest pains not alleviated with belching.  They were seldom in occurrence.  He has occasional palpitations.  He denies a personal history of MI.  He denies leg swelling, orthopnea, and paroxysmal nocturnal dyspnea.  He had been smoking a pack of cigarettes daily but has not smoked since his ED visit.  Social history: He is originally from [place], [place].  He is an Cabin crew.  He has a daughter who is an Tourist information centre manager at [employer] high school in [place] with special needs children.  His other daughter, Eduard Clos, works for a [employer] in [place].  She is in [place] state graduate.  He has grandchildren and a daughter from his previous  marriage who still live in [place].  Family history: Brother died of an MI at age 49.  He also had CABG.  Allergies  Allergen Reactions  . Daypro [Oxaprozin] Anaphylaxis  . Hysingla Er [Hydrocodone Bitartrate Er] Hives  . Levofloxacin Hives    Current Outpatient Medications  Medication Sig Dispense Refill  . amLODipine-olmesartan (AZOR) 10-40 MG tablet Take 1 tablet by mouth at bedtime.     Marland Kitchen atorvastatin (LIPITOR) 80 MG tablet Take 80 mg by mouth daily before breakfast.     . buPROPion (WELLBUTRIN XL) 300 MG 24 hr tablet Take 300 mg daily by mouth.    . disulfiram (ANTABUSE) 250 MG tablet Take 500 mg by mouth daily.     . hydrochlorothiazide (HYDRODIURIL) 25 MG tablet Take 25 mg by mouth daily.    Marland Kitchen HYDROcodone-acetaminophen (NORCO) 7.5-325 MG tablet Take 1 tablet by mouth every 6 (six) hours as needed for moderate pain. 42 tablet 0  . ibuprofen (ADVIL,MOTRIN) 800 MG tablet Take 1 tablet (800 mg total) by mouth every 8 (eight) hours as needed. 90 tablet 1  . LORazepam (ATIVAN) 2 MG tablet Take 3 mg by mouth at bedtime.     . meloxicam (MOBIC) 15 MG tablet Take 15 mg by mouth at bedtime.   5  . metoprolol succinate (TOPROL-XL) 100 MG 24 hr tablet Take 100 mg by mouth daily.    Marland Kitchen omeprazole (PRILOSEC) 20 MG capsule Take 20 mg  by mouth daily before breakfast.     . QUEtiapine (SEROQUEL) 25 MG tablet Take 25 mg by mouth at bedtime as needed (for sleep).      No current facility-administered medications for this visit.     Past Medical History:  Diagnosis Date  . Acid reflux   . Arthritis   . Childhood asthma    as child  . Depression   . History of ETOH abuse   . Hyperlipidemia   . Hypertension   . Insomnia   . Sleep apnea    uses CPAP, cannot tolerate  . Urticaria     Past Surgical History:  Procedure Laterality Date  . COLONOSCOPY  May 2010   Dr. Oneida Alar: simple adenoma, normal colon. Repeat 2020  . EAR CYST EXCISION N/A 11/14/2013   Procedure: EXCISION SEBACEOUS CYST  CHEST WALL;  Surgeon: Jamesetta So, MD;  Location: [facility];  Service: General;  Laterality: N/A;  . KNEE ARTHROSCOPY WITH MEDIAL MENISECTOMY Left 08/24/2016   Procedure: KNEE ARTHROSCOPY WITH MEDIAL MENISECTOMY;  Surgeon: Carole Civil, MD;  Location: [facility];  Service: Orthopedics;  Laterality: Left;  . KNEE SURGERY Right 1992  . SHOULDER OPEN ROTATOR CUFF REPAIR Right 03/07/2018   Procedure: ROTATOR CUFF REPAIR SHOULDER OPEN with distal clavicle excison superior capsular reconstruction;  Surgeon: Carole Civil, MD;  Location: [facility];  Service: Orthopedics;  Laterality: Right;  . UPPER GASTROINTESTINAL ENDOSCOPY  May 2010   Dr. Oneida Alar: normal    Social History   Socioeconomic History  . Marital status: Married    Spouse name: Not on file  . Number of children: Not on file  . Years of education: Not on file  . Highest education level: Not on file  Occupational History  . Not on file  Social Needs  . Financial resource strain: Not on file  . Food insecurity:    Worry: Not on file    Inability: Not on file  . Transportation needs:    Medical: Not on file    Non-medical: Not on file  Tobacco Use  . Smoking status: Current Every Day Smoker    Packs/day: 1.00    Years: 25.00    Pack years: 25.00    Types: Cigarettes  . Smokeless tobacco: Never Used  Substance and Sexual Activity  . Alcohol use: Yes    Alcohol/week: 3.0 standard drinks    Types: 3 Cans of beer per week  . Drug use: No  . Sexual activity: Yes    Birth control/protection: None  Lifestyle  . Physical activity:    Days per week: Not on file    Minutes per session: Not on file  . Stress: Not on file  Relationships  . Social connections:    Talks on phone: Not on file    Gets together: Not on file    Attends religious service: Not on file    Active member of club or organization: Not on file    Attends meetings of clubs or organizations: Not on file    Relationship status: Not on file  . Intimate  partner violence:    Fear of current or ex partner: Not on file    Emotionally abused: Not on file    Physically abused: Not on file    Forced sexual activity: Not on file  Other Topics Concern  . Not on file  Social History Narrative  . Not on file      Current Meds  Medication  Sig  . amLODipine-olmesartan (AZOR) 10-40 MG tablet Take 1 tablet by mouth at bedtime.   Marland Kitchen atorvastatin (LIPITOR) 80 MG tablet Take 80 mg by mouth daily before breakfast.   . buPROPion (WELLBUTRIN XL) 300 MG 24 hr tablet Take 300 mg daily by mouth.  . disulfiram (ANTABUSE) 250 MG tablet Take 500 mg by mouth daily.   . hydrochlorothiazide (HYDRODIURIL) 25 MG tablet Take 25 mg by mouth daily.  Marland Kitchen HYDROcodone-acetaminophen (NORCO) 7.5-325 MG tablet Take 1 tablet by mouth every 6 (six) hours as needed for moderate pain.  Marland Kitchen ibuprofen (ADVIL,MOTRIN) 800 MG tablet Take 1 tablet (800 mg total) by mouth every 8 (eight) hours as needed.  Marland Kitchen LORazepam (ATIVAN) 2 MG tablet Take 3 mg by mouth at bedtime.   . meloxicam (MOBIC) 15 MG tablet Take 15 mg by mouth at bedtime.   . metoprolol succinate (TOPROL-XL) 100 MG 24 hr tablet Take 100 mg by mouth daily.  Marland Kitchen omeprazole (PRILOSEC) 20 MG capsule Take 20 mg by mouth daily before breakfast.   . QUEtiapine (SEROQUEL) 25 MG tablet Take 25 mg by mouth at bedtime as needed (for sleep).       Review of systems complete and found to be negative unless listed above in HPI    Physical exam Blood pressure (!) 146/76, pulse 74, height 5\' 10"  (1.778 m), weight 253 lb (114.8 kg), SpO2 95 %. General: NAD Neck: No JVD, no thyromegaly or thyroid nodule.  Lungs: Diminished breath sounds throughout, no crackles or wheezes. CV: Nondisplaced PMI. Regular rate and rhythm, normal S1/S2, no S3/S4, no murmur.  No peripheral edema.  No carotid bruit.    Abdomen: Soft, nontender, no distention.  Skin: Intact without lesions or rashes.  Neurologic: Alert and oriented x 3.  Psych: Normal  affect. Extremities: No clubbing or cyanosis.  HEENT: Normal.   ECG: Most recent ECG reviewed.   Labs: Lab Results  Component Value Date/Time   K 3.4 (L) 05/23/2018 09:19 PM   BUN 30 (H) 05/23/2018 09:19 PM   CREATININE 1.52 (H) 05/23/2018 09:19 PM   CREATININE 0.86 06/03/2014 06:03 PM   ALT 111 (H) 06/27/2017 08:40 AM   HGB 16.2 05/23/2018 09:19 PM     Lipids: No results found for: LDLCALC, LDLDIRECT, CHOL, TRIG, HDL      ASSESSMENT AND PLAN:   1.  Chest pain: Both typical and atypical symptoms.  Cardiovascular risk factors include family history of premature coronary artery disease, hypertension, and tobacco abuse.  I will obtain coronary CT angiography.  2.  Hypertension: Blood pressure is elevated today.  Currently on Azor 10-40 mg, hydrochlorothiazide, and Toprol-XL.  This will need further monitoring.  3.  Sleep apnea: Is not able to tolerate CPAP.  4.  Tobacco abuse: He smoked 1 pack of cigarettes daily for several years but has not smoked in the past few days.    Disposition: Follow up in 3 months  Signed: Kate Sable, M.D., F.A.C.C.  05/27/2018, 12:57 PM

## 2018-05-27 NOTE — Patient Instructions (Addendum)
Medication Instructions:  Your physician recommends that you continue on your current medications as directed. Please refer to the Current Medication list given to you today.  If you need a refill on your cardiac medications before your next appointment, please call your pharmacy.   Lab work: None today If you have labs (blood work) drawn today and your tests are completely normal, you will receive your results only by: Marland Kitchen MyChart Message (if you have MyChart) OR . A paper copy in the mail If you have any lab test that is abnormal or we need to change your treatment, we will call you to review the results.  Testing/Procedures: Please arrive at the [facility] main entrance of [facility] at  AM (30-45 minutes prior to test start time)  [facility] [place], [place] [address] [phone]  Proceed to the [facility] Radiology Department (First Floor).  Please follow these instructions carefully (unless otherwise directed):  Hold all erectile dysfunction medications at least 48 hours prior to test.  On the Night Before the Test: . Be sure to Drink plenty of water. . Do not consume any caffeinated/decaffeinated beverages or chocolate 12 hours prior to your test. . Do not take any antihistamines 12 hours prior to your test. . If you take Metformin do not take 24 hours prior to test. IOn the Day of the Test: . Drink plenty of water. Do not drink any water within one hour of the test. . Do not eat any food 4 hours prior to the test. . You may take your regular medications prior to the test.  . Take metoprolol (Lopressor) two hours prior to test. .         After the Test: . Drink plenty of water. . After receiving IV contrast, you may experience a mild flushed feeling. This is normal. . On occasion, you may experience a mild rash up to 24 hours after the test. This is not dangerous. If this occurs, you can take Benadryl 25 mg and increase your  fluid intake. . If you experience trouble breathing, this can be serious. If it is severe call 911 IMMEDIATELY. If it is mild, please call our office. . If you take any of these medications: Glipizide/Metformin, Avandament, Glucavance, please do not take 48 hours after completing test.   Follow-Up: At [facility], you and your health needs are our priority.  As part of our continuing mission to provide you with exceptional heart care, we have created designated Provider Care Teams.  These Care Teams include your primary Cardiologist (physician) and Advanced Practice Providers (APPs -  Physician Assistants and Nurse Practitioners) who all work together to provide you with the care you need, when you need it. You will need a follow up appointment in 6 months.  Please call our office 2 months in advance to schedule this appointment.  You may see Kate Sable, MD or one of the following Advanced Practice Providers on your designated Care Team:   Bernerd Pho, PA-C [facility]) . Ermalinda Barrios, PA-C ([place])  Any Other Special Instructions Will Be Listed Below (If Applicable). NONE

## 2018-05-28 ENCOUNTER — Encounter (HOSPITAL_COMMUNITY): Payer: Self-pay | Admitting: Occupational Therapy

## 2018-05-28 ENCOUNTER — Ambulatory Visit (HOSPITAL_COMMUNITY): Payer: BLUE CROSS/BLUE SHIELD | Attending: Orthopedic Surgery | Admitting: Occupational Therapy

## 2018-05-28 DIAGNOSIS — M25611 Stiffness of right shoulder, not elsewhere classified: Secondary | ICD-10-CM | POA: Diagnosis not present

## 2018-05-28 DIAGNOSIS — R29898 Other symptoms and signs involving the musculoskeletal system: Secondary | ICD-10-CM | POA: Diagnosis not present

## 2018-05-28 DIAGNOSIS — M25511 Pain in right shoulder: Secondary | ICD-10-CM | POA: Diagnosis not present

## 2018-05-28 NOTE — Therapy (Signed)
[place], [place], [address] Phone: [phone]   Fax:  [phone]  Occupational Therapy Treatment  Patient Details  Name: Ryan Hickman MRN: [id number] Date of Birth: 08-19-1960 Referring Provider (OT): Dr. Arther Abbott   Encounter Date: 05/28/2018  OT End of Session - 05/28/18 1116    Visit Number  11    Number of Visits  16    Date for OT Re-Evaluation  06/17/18    Authorization Type  Self-pay until gets new insurance. Pt insurance terminated on 05/24/2018    OT Start Time  1032    OT Stop Time  1113    OT Time Calculation (min)  41 min    Activity Tolerance  Patient tolerated treatment well    Behavior During Therapy  WFL for tasks assessed/performed       Past Medical History:  Diagnosis Date  . Acid reflux   . Arthritis   . Childhood asthma    as child  . Depression   . History of ETOH abuse   . Hyperlipidemia   . Hypertension   . Insomnia   . Sleep apnea    uses CPAP, cannot tolerate  . Urticaria     Past Surgical History:  Procedure Laterality Date  . COLONOSCOPY  May 2010   Dr. Oneida Alar: simple adenoma, normal colon. Repeat 2020  . EAR CYST EXCISION N/A 11/14/2013   Procedure: EXCISION SEBACEOUS CYST CHEST WALL;  Surgeon: Jamesetta So, MD;  Location: [facility];  Service: General;  Laterality: N/A;  . KNEE ARTHROSCOPY WITH MEDIAL MENISECTOMY Left 08/24/2016   Procedure: KNEE ARTHROSCOPY WITH MEDIAL MENISECTOMY;  Surgeon: Carole Civil, MD;  Location: [facility];  Service: Orthopedics;  Laterality: Left;  . KNEE SURGERY Right 1992  . SHOULDER OPEN ROTATOR CUFF REPAIR Right 03/07/2018   Procedure: ROTATOR CUFF REPAIR SHOULDER OPEN with distal clavicle excison superior capsular reconstruction;  Surgeon: Carole Civil, MD;  Location: [facility];  Service: Orthopedics;  Laterality: Right;  . UPPER GASTROINTESTINAL ENDOSCOPY  May 2010   Dr. Oneida Alar: normal    There were no vitals filed for  this visit.  Subjective Assessment - 05/28/18 1031    Subjective   S: I'd like to keep coming for a while.    Currently in Pain?  Yes    Pain Score  2     Pain Location  Shoulder    Pain Orientation  Right    Pain Descriptors / Indicators  Aching;Sore;Tightness    Pain Type  Acute pain    Pain Radiating Towards  N/A    Pain Onset  In the past 7 days    Pain Frequency  Intermittent    Aggravating Factors   movement, exercises    Pain Relieving Factors  rest, stretching    Effect of Pain on Daily Activities  min effect on ADLs    Multiple Pain Sites  No         [facility] OT Assessment - 05/28/18 1030      Assessment   Medical Diagnosis  s/p right RC repair      Precautions   Precautions  Shoulder    Type of Shoulder Precautions  progress as tolerated, no lifting over 10#               OT Treatments/Exercises (OP) - 05/28/18 1038      Exercises   Exercises  Shoulder      Shoulder  Exercises: Supine   Protraction  PROM;5 reps;Strengthening;12 reps    Protraction Weight (lbs)  2    Horizontal ABduction  PROM;5 reps;Strengthening;12 reps    Horizontal ABduction Weight (lbs)  2    External Rotation  PROM;5 reps;Strengthening;12 reps    External Rotation Weight (lbs)  2    Internal Rotation  PROM;5 reps;Strengthening;12 reps    Internal Rotation Weight (lbs)  2    Flexion  PROM;5 reps;Strengthening;12 reps    Shoulder Flexion Weight (lbs)  2    ABduction  PROM;5 reps;Strengthening;12 reps    Shoulder ABduction Weight (lbs)  2      Shoulder Exercises: Standing   Protraction  Strengthening;12 reps    Protraction Weight (lbs)  2    Horizontal ABduction  Strengthening;12 reps    Horizontal ABduction Weight (lbs)  2    External Rotation  Strengthening;12 reps    External Rotation Weight (lbs)  2    Internal Rotation  Strengthening;12 reps    Internal Rotation Weight (lbs)  2    Flexion  Strengthening;12 reps    Shoulder Flexion Weight (lbs)  2    ABduction   Strengthening;12 reps    Shoulder ABduction Weight (lbs)  2      Shoulder Exercises: ROM/Strengthening   Other ROM/Strengthening Exercises  red loop band: lateral wall slides, clock diagonals 10X      Manual Therapy   Manual Therapy  Myofascial release    Manual therapy comments  Manual therapy completed prior to exercises.     Myofascial Release  Myofascial release and manual stretching completed to right upper arm, trapezius, and scapularis region to decrease fascial restrictions and increase joint mobility needed for functional tasks.                OT Short Term Goals - 05/16/18 1325      OT SHORT TERM GOAL #1   Title  Pt will be provided with and educated on HEP to improve functional use of RUE as dominant during ADL completion.     Time  4    Period  Weeks    Status  On-going      OT SHORT TERM GOAL #2   Title  Pt will decrease pain in RUE to 4/10  to improve ability to sleep at night for 5 consecutive hours or greater.     Time  4    Period  Weeks    Status  Achieved      OT SHORT TERM GOAL #3   Title  Pt will improve RUE A/ROM to [facility] to improve ability to use RUE as assist during dressing tasks.     Time  4    Period  Weeks    Status  Achieved        OT Long Term Goals - 05/16/18 1325      OT LONG TERM GOAL #1   Title  Pt will return to highest level of functioning using RUE as dominant during all daily and work tasks.     Time  8    Period  Weeks    Status  On-going      OT LONG TERM GOAL #2   Title  Pt will decrease pain to 2/10 or less in RUE to improve mobility required for ADL task completion.     Time  8    Period  Weeks    Status  On-going      OT LONG TERM GOAL #  3   Title  Pt will improve RUE A/ROM to WNL to improve mobility required for reaching overhead.     Time  8    Period  Weeks    Status  Partially Met      OT LONG TERM GOAL #4   Title  Pt will decrease fascial restrictions in RUE to minimal amounts or less to improve flexibility  required for functional reaching tasks.     Time  8    Period  Weeks    Status  Achieved      OT LONG TERM GOAL #5   Title  Pt will improve strength in RUE to 5/5 to increase ability to perform work tasks including overhead lifting.     Time  8    Period  Weeks    Status  Partially Met            Plan - 05/28/18 1123    Clinical Impression Statement  A: Pt reports MD is pleased with his progress and he is on a 10# lifting restriction. Pt with increased fascial restrictions in RUE today, manual therapy completed to address. Progressed to full ROM with strengthening today, increasing to 2# weights in supine and standing, mod fatigue at end of exercises. Added clock diagonals to red loop band exercises. Pt with occasional discomfort at anterior deltoid/bicep insertion during some exercises today. Verbal cuing for form and technique.     Plan  P: Continue with shoulder and scapular strengthening, progressing as tolerated. Add x to v arms and w arms       Patient will benefit from skilled therapeutic intervention in order to improve the following deficits and impairments:  Decreased range of motion, Impaired flexibility, Decreased activity tolerance, Increased fascial restrictions, Impaired UE functional use, Pain, Decreased strength  Visit Diagnosis: Acute pain of right shoulder  Stiffness of right shoulder, not elsewhere classified  Other symptoms and signs involving the musculoskeletal system    Problem List Patient Active Problem List   Diagnosis Date Noted  . S/P right rotator cuff repair 03/07/18 03/08/2018  . Traumatic complete tear of right rotator cuff   . Arthrosis of right acromioclavicular joint   . Osteoarthritis of left knee 06/26/2013  . Derangement of posterior horn of medial meniscus 06/26/2013  . Left knee pain 06/26/2013  . Insomnia 04/20/2011  . OSA (obstructive sleep apnea) 04/20/2011  . OBESITY 12/20/2009  . TRANSAMINASES, SERUM, ELEVATED 12/20/2009  .  RECTAL BLEEDING 08/13/2008  . HYPERCHOLESTEROLEMIA 08/12/2008  . DEHYDRATION 08/12/2008  . DEPRESSION/ANXIETY 08/12/2008  . ALCOHOL ABUSE 08/12/2008  . HYPERTENSION 08/12/2008  . [facility] 08/12/2008  . GASTROESOPHAGEAL REFLUX DISEASE, CHRONIC 08/12/2008  . FEVER UNSPECIFIED 08/12/2008  . VOMITING 08/12/2008  . DIARRHEA 08/12/2008   Ryan Hickman, OTR/L  [phone] 05/28/2018, 11:26 AM  [place] [address] [place], [place], [address] Phone: [phone]   Fax:  [phone]  Name: Ryan Hickman MRN: [id number] Date of Birth: 20-Apr-1961

## 2018-05-30 ENCOUNTER — Encounter (HOSPITAL_COMMUNITY): Payer: 59 | Admitting: Occupational Therapy

## 2018-05-31 NOTE — ED Provider Notes (Signed)
[facility] EMERGENCY DEPARTMENT Provider Note   CSN: [id number] Arrival date & time: 05/23/18  2008     History   Chief Complaint Chief Complaint  Patient presents with  . Chest Pain    HPI CARZELL SALDIVAR is a 58 y.o. male.  The history is provided by the patient and a relative.  Chest Pain  Pain location:  Substernal area Pain quality: aching   Pain radiates to:  Does not radiate Pain severity:  Mild Onset quality:  Gradual Duration:  3 days Timing:  Intermittent Chronicity:  New Relieved by:  None tried Worsened by:  Nothing   Past Medical History:  Diagnosis Date  . Acid reflux   . Arthritis   . Childhood asthma    as child  . Depression   . History of ETOH abuse   . Hyperlipidemia   . Hypertension   . Insomnia   . Sleep apnea    uses CPAP, cannot tolerate  . Urticaria     Patient Active Problem List   Diagnosis Date Noted  . S/P right rotator cuff repair 03/07/18 03/08/2018  . Traumatic complete tear of right rotator cuff   . Arthrosis of right acromioclavicular joint   . Osteoarthritis of left knee 06/26/2013  . Derangement of posterior horn of medial meniscus 06/26/2013  . Left knee pain 06/26/2013  . Insomnia 04/20/2011  . OSA (obstructive sleep apnea) 04/20/2011  . OBESITY 12/20/2009  . TRANSAMINASES, SERUM, ELEVATED 12/20/2009  . RECTAL BLEEDING 08/13/2008  . HYPERCHOLESTEROLEMIA 08/12/2008  . DEHYDRATION 08/12/2008  . DEPRESSION/ANXIETY 08/12/2008  . ALCOHOL ABUSE 08/12/2008  . HYPERTENSION 08/12/2008  . [facility] 08/12/2008  . GASTROESOPHAGEAL REFLUX DISEASE, CHRONIC 08/12/2008  . FEVER UNSPECIFIED 08/12/2008  . VOMITING 08/12/2008  . DIARRHEA 08/12/2008    Past Surgical History:  Procedure Laterality Date  . COLONOSCOPY  May 2010   Dr. Oneida Alar: simple adenoma, normal colon. Repeat 2020  . EAR CYST EXCISION N/A 11/14/2013   Procedure: EXCISION SEBACEOUS CYST CHEST WALL;  Surgeon: Jamesetta So, MD;  Location: [facility];  Service:  General;  Laterality: N/A;  . KNEE ARTHROSCOPY WITH MEDIAL MENISECTOMY Left 08/24/2016   Procedure: KNEE ARTHROSCOPY WITH MEDIAL MENISECTOMY;  Surgeon: Carole Civil, MD;  Location: [facility];  Service: Orthopedics;  Laterality: Left;  . KNEE SURGERY Right 1992  . SHOULDER OPEN ROTATOR CUFF REPAIR Right 03/07/2018   Procedure: ROTATOR CUFF REPAIR SHOULDER OPEN with distal clavicle excison superior capsular reconstruction;  Surgeon: Carole Civil, MD;  Location: [facility];  Service: Orthopedics;  Laterality: Right;  . UPPER GASTROINTESTINAL ENDOSCOPY  May 2010   Dr. Oneida Alar: normal        Home Medications    Prior to Admission medications   Medication Sig Start Date End Date Taking? Authorizing Provider  amLODipine-olmesartan (AZOR) 10-40 MG tablet Take 1 tablet by mouth at bedtime.  05/20/17  Yes [provider]  atorvastatin (LIPITOR) 80 MG tablet Take 80 mg by mouth daily before breakfast.  11/04/15  Yes [provider]  buPROPion (WELLBUTRIN XL) 300 MG 24 hr tablet Take 300 mg daily by mouth. 02/21/17  Yes [provider]  hydrochlorothiazide (HYDRODIURIL) 25 MG tablet Take 25 mg by mouth daily.   Yes [provider]  LORazepam (ATIVAN) 2 MG tablet Take 3 mg by mouth at bedtime.    Yes [provider]  metoprolol succinate (TOPROL-XL) 100 MG 24 hr tablet Take 100 mg by mouth daily. 05/23/18  Yes [provider]  omeprazole (PRILOSEC) 20 MG capsule Take 20 mg by mouth daily before breakfast.    Yes [provider]  disulfiram (ANTABUSE) 250 MG tablet Take 500 mg by mouth daily.  01/11/18   [provider]  HYDROcodone-acetaminophen (NORCO) 7.5-325 MG tablet Take 1 tablet by mouth every 6 (six) hours as needed for moderate pain. 05/24/18   Carole Civil, MD  ibuprofen (ADVIL,MOTRIN) 800 MG tablet Take 1 tablet (800 mg total) by mouth every 8 (eight) hours as needed. 05/01/18   Carole Civil, MD  meloxicam  (MOBIC) 15 MG tablet Take 15 mg by mouth at bedtime.  07/20/16   [provider]  QUEtiapine (SEROQUEL) 25 MG tablet Take 25 mg by mouth at bedtime as needed (for sleep).  03/07/18   [provider]    Family History Family History  Problem Relation Age of Onset  . Heart failure Other   . Hypertension Mother   . Arrhythmia Mother   . Hypertension Father   . Atrial fibrillation Sister   . Colon cancer Neg Hx   . Allergic rhinitis Neg Hx   . Asthma Neg Hx   . Eczema Neg Hx   . Urticaria Neg Hx     Social History Social History   Tobacco Use  . Smoking status: Current Every Day Smoker    Packs/day: 1.00    Years: 25.00    Pack years: 25.00    Types: Cigarettes  . Smokeless tobacco: Never Used  Substance Use Topics  . Alcohol use: Yes    Alcohol/week: 3.0 standard drinks    Types: 3 Cans of beer per week  . Drug use: No     Allergies   Daypro [oxaprozin]; Hysingla er [hydrocodone bitartrate er]; and Levofloxacin   Review of Systems Review of Systems  Cardiovascular: Positive for chest pain.  All other systems reviewed and are negative.    Physical Exam Updated Vital Signs BP 123/74 (BP Location: Left Arm)   Pulse 71   Temp 98.1 F (36.7 C) (Oral)   Resp 15   Ht 5\' 10"  (1.778 m)   Wt 117.9 kg   SpO2 98%   BMI 37.31 kg/m   Physical Exam Vitals signs and nursing note reviewed.  Constitutional:      Appearance: He is well-developed.  HENT:     Head: Normocephalic and atraumatic.     Mouth/Throat:     Mouth: Mucous membranes are dry.  Eyes:     Pupils: Pupils are equal, round, and reactive to light.  Neck:     Musculoskeletal: Normal range of motion.  Cardiovascular:     Rate and Rhythm: Normal rate.  Pulmonary:     Effort: Pulmonary effort is normal. No respiratory distress.     Breath sounds: Normal breath sounds.  Chest:     Chest wall: No mass or tenderness.  Abdominal:     General: There is no distension.     Palpations:  Abdomen is soft.  Musculoskeletal: Normal range of motion.  Skin:    General: Skin is warm and dry.  Neurological:     General: No focal deficit present.     Mental Status: He is alert.      ED Treatments / Results  Labs (all labs ordered are listed, but only abnormal results are displayed) Labs Reviewed  BASIC METABOLIC PANEL - Abnormal; Notable for the following components:      Result Value   Potassium 3.4 (*)  BUN 30 (*)    Creatinine, Ser 1.52 (*)    GFR calc non Af Amer 50 (*)    GFR calc Af Amer 59 (*)    All other components within normal limits  CBC - Abnormal; Notable for the following components:   WBC 13.5 (*)    All other components within normal limits  TROPONIN I  TROPONIN I  I-STAT TROPONIN, ED  POCT I-STAT TROPONIN I    EKG EKG Interpretation  Date/Time:  Thursday May 23 2018 20:21:31 EST Ventricular Rate:  86 PR Interval:  180 QRS Duration: 136 QT Interval:  384 QTC Calculation: 459 R Axis:   103 Text Interpretation:  Sinus rhythm with Premature atrial complexes Right bundle branch block Abnormal ECG Confirmed by Milton Ferguson [phone]) on 05/25/2018 11:36:54 AM   Radiology No results found.  Procedures Procedures (including critical care time)  Medications Ordered in ED Medications  lactated ringers bolus 1,000 mL (0 mLs Intravenous Stopped 05/24/18 0128)  albuterol (PROVENTIL HFA;VENTOLIN HFA) 108 (90 Base) MCG/ACT inhaler 2 puff (2 puffs Inhalation Given 05/23/18 2317)  predniSONE (DELTASONE) tablet 60 mg (60 mg Oral Given 05/23/18 2316)     Initial Impression / Assessment and Plan / ED Course  I have reviewed the triage vital signs and the nursing notes.  Pertinent labs & imaging results that were available during my care of the patient were reviewed by me and considered in my medical decision making (see chart for details).     Low risk for acs. Care transferred pending second troponin. Doubt PE. No e/o reflux, costochondritis.  Possibly anxiety/stress related? Needs pcp follow up  Final Clinical Impressions(s) / ED Diagnoses   Final diagnoses:  Acute chest pain    ED Discharge Orders    None       Johnaton Sonneborn, Corene Cornea, MD 05/31/18 [phone]

## 2018-06-04 ENCOUNTER — Ambulatory Visit (HOSPITAL_COMMUNITY): Payer: BLUE CROSS/BLUE SHIELD | Admitting: Occupational Therapy

## 2018-06-06 ENCOUNTER — Encounter (HOSPITAL_COMMUNITY): Payer: Self-pay | Admitting: Occupational Therapy

## 2018-06-06 ENCOUNTER — Ambulatory Visit (HOSPITAL_COMMUNITY): Payer: BLUE CROSS/BLUE SHIELD | Admitting: Occupational Therapy

## 2018-06-06 DIAGNOSIS — M25611 Stiffness of right shoulder, not elsewhere classified: Secondary | ICD-10-CM

## 2018-06-06 DIAGNOSIS — M25511 Pain in right shoulder: Secondary | ICD-10-CM | POA: Diagnosis not present

## 2018-06-06 DIAGNOSIS — R29898 Other symptoms and signs involving the musculoskeletal system: Secondary | ICD-10-CM | POA: Diagnosis not present

## 2018-06-06 NOTE — Therapy (Signed)
[place], [place], [address] Phone: [phone]   Fax:  [phone]  Occupational Therapy Treatment  Patient Details  Name: Ryan Hickman MRN: [id number] Date of Birth: 30-Sep-1960 Referring Provider (OT): Dr. Arther Abbott   Encounter Date: 06/06/2018  OT End of Session - 06/06/18 1027    Visit Number  12    Number of Visits  16    Date for OT Re-Evaluation  06/17/18    Authorization Type  Self-pay until gets new insurance. Pt insurance terminated on 05/24/2018    OT Start Time  0948    OT Stop Time  1027    OT Time Calculation (min)  39 min    Activity Tolerance  Patient tolerated treatment well    Behavior During Therapy  [facility] for tasks assessed/performed       Past Medical History:  Diagnosis Date  . Acid reflux   . Arthritis   . Childhood asthma    as child  . Depression   . History of ETOH abuse   . Hyperlipidemia   . Hypertension   . Insomnia   . Sleep apnea    uses CPAP, cannot tolerate  . Urticaria     Past Surgical History:  Procedure Laterality Date  . COLONOSCOPY  May 2010   Dr. Oneida Alar: simple adenoma, normal colon. Repeat 2020  . EAR CYST EXCISION N/A 11/14/2013   Procedure: EXCISION SEBACEOUS CYST CHEST WALL;  Surgeon: Jamesetta So, MD;  Location: [facility];  Service: General;  Laterality: N/A;  . KNEE ARTHROSCOPY WITH MEDIAL MENISECTOMY Left 08/24/2016   Procedure: KNEE ARTHROSCOPY WITH MEDIAL MENISECTOMY;  Surgeon: Carole Civil, MD;  Location: [facility];  Service: Orthopedics;  Laterality: Left;  . KNEE SURGERY Right 1992  . SHOULDER OPEN ROTATOR CUFF REPAIR Right 03/07/2018   Procedure: ROTATOR CUFF REPAIR SHOULDER OPEN with distal clavicle excison superior capsular reconstruction;  Surgeon: Carole Civil, MD;  Location: [facility];  Service: Orthopedics;  Laterality: Right;  . UPPER GASTROINTESTINAL ENDOSCOPY  May 2010   Dr. Oneida Alar: normal    There were no vitals filed for  this visit.  Subjective Assessment - 06/06/18 0946    Subjective   S: I haven't done any exercises.     Currently in Pain?  No/denies         [facility] OT Assessment - 06/06/18 0946      Assessment   Medical Diagnosis  s/p right RC repair      Precautions   Precautions  Shoulder    Type of Shoulder Precautions  progress as tolerated, no lifting over 10#               OT Treatments/Exercises (OP) - 06/06/18 0950      Exercises   Exercises  Shoulder      Shoulder Exercises: Supine   Protraction  PROM;5 reps;Strengthening;12 reps    Protraction Weight (lbs)  2    Horizontal ABduction  PROM;5 reps;Strengthening;12 reps    Horizontal ABduction Weight (lbs)  2    External Rotation  PROM;5 reps;Strengthening;12 reps    External Rotation Weight (lbs)  2    Internal Rotation  PROM;5 reps;Strengthening;12 reps    Internal Rotation Weight (lbs)  2    Flexion  PROM;5 reps;Strengthening;12 reps    Shoulder Flexion Weight (lbs)  2    ABduction  PROM;5 reps;Strengthening;12 reps    Shoulder ABduction Weight (lbs)  2  Shoulder Exercises: Standing   Protraction  Strengthening;12 reps    Protraction Weight (lbs)  2    Horizontal ABduction  Strengthening;12 reps    Horizontal ABduction Weight (lbs)  2    External Rotation  Strengthening;12 reps    External Rotation Weight (lbs)  2    Internal Rotation  Strengthening;12 reps    Internal Rotation Weight (lbs)  2    Flexion  Strengthening;12 reps    Shoulder Flexion Weight (lbs)  2    ABduction  Strengthening;12 reps    Shoulder ABduction Weight (lbs)  2      Shoulder Exercises: ROM/Strengthening   UBE (Upper Arm Bike)  Level 2 3' forward 3' reverse   pace 6.0   "W" Arms  10X    X to V Arms  10X, 2#    Other ROM/Strengthening Exercises  red loop band: lateral wall slides, clock diagonals 10X    Other ROM/Strengthening Exercises  Y lift off, 10X      Manual Therapy   Manual Therapy  Myofascial release    Manual therapy  comments  Manual therapy completed prior to exercises.     Myofascial Release  Myofascial release and manual stretching completed to right upper arm, trapezius, and scapularis region to decrease fascial restrictions and increase joint mobility needed for functional tasks.                OT Short Term Goals - 05/16/18 1325      OT SHORT TERM GOAL #1   Title  Pt will be provided with and educated on HEP to improve functional use of RUE as dominant during ADL completion.     Time  4    Period  Weeks    Status  On-going      OT SHORT TERM GOAL #2   Title  Pt will decrease pain in RUE to 4/10  to improve ability to sleep at night for 5 consecutive hours or greater.     Time  4    Period  Weeks    Status  Achieved      OT SHORT TERM GOAL #3   Title  Pt will improve RUE A/ROM to [facility] to improve ability to use RUE as assist during dressing tasks.     Time  4    Period  Weeks    Status  Achieved        OT Long Term Goals - 05/16/18 1325      OT LONG TERM GOAL #1   Title  Pt will return to highest level of functioning using RUE as dominant during all daily and work tasks.     Time  8    Period  Weeks    Status  On-going      OT LONG TERM GOAL #2   Title  Pt will decrease pain to 2/10 or less in RUE to improve mobility required for ADL task completion.     Time  8    Period  Weeks    Status  On-going      OT LONG TERM GOAL #3   Title  Pt will improve RUE A/ROM to WNL to improve mobility required for reaching overhead.     Time  8    Period  Weeks    Status  Partially Met      OT LONG TERM GOAL #4   Title  Pt will decrease fascial restrictions in RUE to minimal amounts or less  to improve flexibility required for functional reaching tasks.     Time  8    Period  Weeks    Status  Achieved      OT LONG TERM GOAL #5   Title  Pt will improve strength in RUE to 5/5 to increase ability to perform work tasks including overhead lifting.     Time  8    Period  Weeks     Status  Partially Met            Plan - 06/06/18 1022    Clinical Impression Statement  A: Pt with moderate sized muscle knot along upper trapezius, manual therapy completed to address. Continued with shoulder strengthening, adding x to v arms, w arms, and Y lift off today. Verbal cuing for form and technique.     Plan  P: Add green therapy ball shoulder strengthening, add overhead lacing       Patient will benefit from skilled therapeutic intervention in order to improve the following deficits and impairments:  Decreased range of motion, Impaired flexibility, Decreased activity tolerance, Increased fascial restrictions, Impaired UE functional use, Pain, Decreased strength  Visit Diagnosis: Acute pain of right shoulder  Stiffness of right shoulder, not elsewhere classified  Other symptoms and signs involving the musculoskeletal system    Problem List Patient Active Problem List   Diagnosis Date Noted  . S/P right rotator cuff repair 03/07/18 03/08/2018  . Traumatic complete tear of right rotator cuff   . Arthrosis of right acromioclavicular joint   . Osteoarthritis of left knee 06/26/2013  . Derangement of posterior horn of medial meniscus 06/26/2013  . Left knee pain 06/26/2013  . Insomnia 04/20/2011  . OSA (obstructive sleep apnea) 04/20/2011  . OBESITY 12/20/2009  . TRANSAMINASES, SERUM, ELEVATED 12/20/2009  . RECTAL BLEEDING 08/13/2008  . HYPERCHOLESTEROLEMIA 08/12/2008  . DEHYDRATION 08/12/2008  . DEPRESSION/ANXIETY 08/12/2008  . ALCOHOL ABUSE 08/12/2008  . HYPERTENSION 08/12/2008  . [facility] 08/12/2008  . GASTROESOPHAGEAL REFLUX DISEASE, CHRONIC 08/12/2008  . FEVER UNSPECIFIED 08/12/2008  . VOMITING 08/12/2008  . DIARRHEA 08/12/2008   Guadelupe Sabin, OTR/L  [phone] 06/06/2018, 10:28 AM  [place] [address] [place], [place], [address] Phone: [phone]   Fax:  [phone]  Name: SHANTA DORVIL MRN:  [id number] Date of Birth: 16-Aug-1960

## 2018-06-10 ENCOUNTER — Ambulatory Visit: Payer: BLUE CROSS/BLUE SHIELD | Admitting: Cardiovascular Disease

## 2018-06-11 ENCOUNTER — Encounter (HOSPITAL_COMMUNITY): Payer: Self-pay | Admitting: Occupational Therapy

## 2018-06-11 ENCOUNTER — Ambulatory Visit (HOSPITAL_COMMUNITY): Payer: BLUE CROSS/BLUE SHIELD | Admitting: Occupational Therapy

## 2018-06-11 DIAGNOSIS — R29898 Other symptoms and signs involving the musculoskeletal system: Secondary | ICD-10-CM | POA: Diagnosis not present

## 2018-06-11 DIAGNOSIS — M25611 Stiffness of right shoulder, not elsewhere classified: Secondary | ICD-10-CM

## 2018-06-11 DIAGNOSIS — M25511 Pain in right shoulder: Secondary | ICD-10-CM | POA: Diagnosis not present

## 2018-06-11 NOTE — Therapy (Signed)
[place], [place], [address] Phone: [phone]   Fax:  [phone]  Occupational Therapy Treatment  Patient Details  Name: Ryan Hickman MRN: [id number] Date of Birth: 09/14/1960 Referring Provider (OT): Dr. Arther Abbott   Encounter Date: 06/11/2018  OT End of Session - 06/11/18 1021    Visit Number  13    Number of Visits  16    Date for OT Re-Evaluation  06/17/18    Authorization Type  Self-pay until gets new insurance. Pt insurance terminated on 05/24/2018    OT Start Time  0935    OT Stop Time  1020    OT Time Calculation (min)  45 min    Activity Tolerance  Patient tolerated treatment well    Behavior During Therapy  [facility] for tasks assessed/performed       Past Medical History:  Diagnosis Date  . Acid reflux   . Arthritis   . Childhood asthma    as child  . Depression   . History of ETOH abuse   . Hyperlipidemia   . Hypertension   . Insomnia   . Sleep apnea    uses CPAP, cannot tolerate  . Urticaria     Past Surgical History:  Procedure Laterality Date  . COLONOSCOPY  May 2010   Dr. Oneida Alar: simple adenoma, normal colon. Repeat 2020  . EAR CYST EXCISION N/A 11/14/2013   Procedure: EXCISION SEBACEOUS CYST CHEST WALL;  Surgeon: Jamesetta So, MD;  Location: [facility];  Service: General;  Laterality: N/A;  . KNEE ARTHROSCOPY WITH MEDIAL MENISECTOMY Left 08/24/2016   Procedure: KNEE ARTHROSCOPY WITH MEDIAL MENISECTOMY;  Surgeon: Carole Civil, MD;  Location: [facility];  Service: Orthopedics;  Laterality: Left;  . KNEE SURGERY Right 1992  . SHOULDER OPEN ROTATOR CUFF REPAIR Right 03/07/2018   Procedure: ROTATOR CUFF REPAIR SHOULDER OPEN with distal clavicle excison superior capsular reconstruction;  Surgeon: Carole Civil, MD;  Location: [facility];  Service: Orthopedics;  Laterality: Right;  . UPPER GASTROINTESTINAL ENDOSCOPY  May 2010   Dr. Oneida Alar: normal    There were no vitals filed for  this visit.  Subjective Assessment - 06/11/18 0934    Subjective   S: I went grocery shopping and picked up some bags, it wasn't bad.     Currently in Pain?  No/denies         [facility] OT Assessment - 06/11/18 0934      Assessment   Medical Diagnosis  s/p right RC repair      Precautions   Precautions  Shoulder    Type of Shoulder Precautions  progress as tolerated, no lifting over 10#               OT Treatments/Exercises (OP) - 06/11/18 0934      Exercises   Exercises  Shoulder      Shoulder Exercises: Supine   Protraction  PROM;5 reps    Horizontal ABduction  PROM;5 reps    External Rotation  PROM;5 reps   abducted   Internal Rotation  PROM;5 reps   abducted   Flexion  PROM;5 reps    ABduction  PROM;5 reps      Shoulder Exercises: Sidelying   External Rotation  Theraband;12 reps    Theraband Level (Shoulder External Rotation)  Level 2 (Red)    Internal Rotation  Theraband;12 reps    Theraband Level (Shoulder Internal Rotation)  Level 2 (Red)  Flexion  Theraband;12 reps    Theraband Level (Shoulder Flexion)  Level 2 (Red)    ABduction  Theraband;12 reps    Theraband Level (Shoulder ABduction)  Level 2 (Red)    Other Sidelying Exercises  protraction, red theraband, 12X    Other Sidelying Exercises  horizontal abduction, red theraband, 12X      Shoulder Exercises: Standing   Protraction  Strengthening;12 reps    Protraction Weight (lbs)  2    Horizontal ABduction  Strengthening;12 reps    Horizontal ABduction Weight (lbs)  2    External Rotation  Strengthening;12 reps   abducted   External Rotation Weight (lbs)  2    Internal Rotation  Strengthening;12 reps   abducted   Internal Rotation Weight (lbs)  2    Flexion  Strengthening;12 reps    Shoulder Flexion Weight (lbs)  2    ABduction  Strengthening;12 reps    Shoulder ABduction Weight (lbs)  2      Shoulder Exercises: Therapy Ball   Other Therapy Ball Exercises  green therapy ball: chest press,  overhead press, flexion, circles each direction, 10X each      Shoulder Exercises: ROM/Strengthening   UBE (Upper Arm Bike)  Level 2 3' forward 3' reverse   pace: 6.0   Over Head Lace  1' seated at bodycraft     X to V Arms  10X, 2#    Ball on Wall  1' flexion 1' abduction    Other ROM/Strengthening Exercises  Y lift off, 10X      Manual Therapy   Manual Therapy  Myofascial release    Manual therapy comments  Manual therapy completed prior to exercises.     Myofascial Release  Myofascial release and manual stretching completed to right upper arm, trapezius, and scapularis region to decrease fascial restrictions and increase joint mobility needed for functional tasks.                OT Short Term Goals - 05/16/18 1325      OT SHORT TERM GOAL #1   Title  Pt will be provided with and educated on HEP to improve functional use of RUE as dominant during ADL completion.     Time  4    Period  Weeks    Status  On-going      OT SHORT TERM GOAL #2   Title  Pt will decrease pain in RUE to 4/10  to improve ability to sleep at night for 5 consecutive hours or greater.     Time  4    Period  Weeks    Status  Achieved      OT SHORT TERM GOAL #3   Title  Pt will improve RUE A/ROM to WFL to improve ability to use RUE as assist during dressing tasks.     Time  4    Period  Weeks    Status  Achieved        OT Long Term Goals - 05/16/18 1325      OT LONG TERM GOAL #1   Title  Pt will return to highest level of functioning using RUE as dominant during all daily and work tasks.     Time  8    Period  Weeks    Status  On-going      OT LONG TERM GOAL #2   Title  Pt will decrease pain to 2/10 or less in RUE to improve mobility required for ADL task   completion.     Time  8    Period  Weeks    Status  On-going      OT LONG TERM GOAL #3   Title  Pt will improve RUE A/ROM to WNL to improve mobility required for reaching overhead.     Time  8    Period  Weeks    Status  Partially  Met      OT LONG TERM GOAL #4   Title  Pt will decrease fascial restrictions in RUE to minimal amounts or less to improve flexibility required for functional reaching tasks.     Time  8    Period  Weeks    Status  Achieved      OT LONG TERM GOAL #5   Title  Pt will improve strength in RUE to 5/5 to increase ability to perform work tasks including overhead lifting.     Time  8    Period  Weeks    Status  Partially Met            Plan - 06/11/18 1021    Clinical Impression Statement  A: Continued with RUE strengthening today. Added sidelying with theraband and green therapy ball exercises for scapular stabilization and strengthening, also resumed ball on wall. Added overhead lacing for activity tolerance improvement. Pt reports moderate fatigue at end of session, verbal cuing for form and technique.     Plan  P: Reassessment, FOTO, determine if ready for discharge with HEP       Patient will benefit from skilled therapeutic intervention in order to improve the following deficits and impairments:  Decreased range of motion, Impaired flexibility, Decreased activity tolerance, Increased fascial restrictions, Impaired UE functional use, Pain, Decreased strength  Visit Diagnosis: Acute pain of right shoulder  Stiffness of right shoulder, not elsewhere classified  Other symptoms and signs involving the musculoskeletal system    Problem List Patient Active Problem List   Diagnosis Date Noted  . S/P right rotator cuff repair 03/07/18 03/08/2018  . Traumatic complete tear of right rotator cuff   . Arthrosis of right acromioclavicular joint   . Osteoarthritis of left knee 06/26/2013  . Derangement of posterior horn of medial meniscus 06/26/2013  . Left knee pain 06/26/2013  . Insomnia 04/20/2011  . OSA (obstructive sleep apnea) 04/20/2011  . OBESITY 12/20/2009  . TRANSAMINASES, SERUM, ELEVATED 12/20/2009  . RECTAL BLEEDING 08/13/2008  . HYPERCHOLESTEROLEMIA 08/12/2008  .  DEHYDRATION 08/12/2008  . DEPRESSION/ANXIETY 08/12/2008  . ALCOHOL ABUSE 08/12/2008  . HYPERTENSION 08/12/2008  . PAC 08/12/2008  . GASTROESOPHAGEAL REFLUX DISEASE, CHRONIC 08/12/2008  . FEVER UNSPECIFIED 08/12/2008  . VOMITING 08/12/2008  . DIARRHEA 08/12/2008   Leslie Troxler, OTR/L  [phone] 06/11/2018, 10:23 AM  [facility] [address] [place], [place], [address] Phone: [phone]   Fax:  [phone]  Name: Ryan Hickman MRN: [id number] Date of Birth: 06/06/1960 

## 2018-06-13 ENCOUNTER — Ambulatory Visit (HOSPITAL_COMMUNITY): Payer: BLUE CROSS/BLUE SHIELD | Admitting: Occupational Therapy

## 2018-06-13 ENCOUNTER — Encounter (HOSPITAL_COMMUNITY): Payer: Self-pay | Admitting: Occupational Therapy

## 2018-06-13 DIAGNOSIS — R29898 Other symptoms and signs involving the musculoskeletal system: Secondary | ICD-10-CM | POA: Diagnosis not present

## 2018-06-13 DIAGNOSIS — M25511 Pain in right shoulder: Secondary | ICD-10-CM | POA: Diagnosis not present

## 2018-06-13 DIAGNOSIS — M25611 Stiffness of right shoulder, not elsewhere classified: Secondary | ICD-10-CM

## 2018-06-13 NOTE — Patient Instructions (Signed)

## 2018-06-13 NOTE — Therapy (Signed)
[place], [place], [address] Phone: [phone]   Fax:  [phone]  Occupational Therapy Reassessment, Treatment, Discharge Summary  Patient Details  Name: Ryan Hickman MRN: [id number] Date of Birth: 09/23/1960 Referring Provider (OT): Dr. Arther Abbott   Encounter Date: 06/13/2018  OT End of Session - 06/13/18 1026    Visit Number  14    Number of Visits  16    Date for OT Re-Evaluation  06/17/18    Authorization Type  Self-pay until gets new insurance. Pt insurance terminated on 05/24/2018    OT Start Time  0947    OT Stop Time  1023    OT Time Calculation (min)  36 min    Activity Tolerance  Patient tolerated treatment well    Behavior During Therapy  [facility] for tasks assessed/performed       Past Medical History:  Diagnosis Date  . Acid reflux   . Arthritis   . Childhood asthma    as child  . Depression   . History of ETOH abuse   . Hyperlipidemia   . Hypertension   . Insomnia   . Sleep apnea    uses CPAP, cannot tolerate  . Urticaria     Past Surgical History:  Procedure Laterality Date  . COLONOSCOPY  May 2010   Dr. Oneida Alar: simple adenoma, normal colon. Repeat 2020  . EAR CYST EXCISION N/A 11/14/2013   Procedure: EXCISION SEBACEOUS CYST CHEST WALL;  Surgeon: Jamesetta So, MD;  Location: [facility];  Service: General;  Laterality: N/A;  . KNEE ARTHROSCOPY WITH MEDIAL MENISECTOMY Left 08/24/2016   Procedure: KNEE ARTHROSCOPY WITH MEDIAL MENISECTOMY;  Surgeon: Carole Civil, MD;  Location: [facility];  Service: Orthopedics;  Laterality: Left;  . KNEE SURGERY Right 1992  . SHOULDER OPEN ROTATOR CUFF REPAIR Right 03/07/2018   Procedure: ROTATOR CUFF REPAIR SHOULDER OPEN with distal clavicle excison superior capsular reconstruction;  Surgeon: Carole Civil, MD;  Location: [facility];  Service: Orthopedics;  Laterality: Right;  . UPPER GASTROINTESTINAL ENDOSCOPY  May 2010   Dr. Oneida Alar: normal     There were no vitals filed for this visit.  Subjective Assessment - 06/13/18 0946    Subjective   S: I've been using 2# weights at home.     Currently in Pain?  No/denies         [facility] OT Assessment - 06/13/18 0946      Assessment   Medical Diagnosis  s/p right RC repair      Precautions   Precautions  Shoulder    Type of Shoulder Precautions  progress as tolerated, no lifting over 10#      Observation/Other Assessments   Focus on Therapeutic Outcomes (FOTO)   64/100   66/100 previous     Palpation   Palpation comment  minimal fascial restrictions at upper trapezius      AROM   Overall AROM Comments  Assessed seated, er/IR adducted    AROM Assessment Site  Shoulder    Right/Left Shoulder  Right    Right Shoulder Flexion  180 Degrees   160 previous   Right Shoulder ABduction  170 Degrees   same as previous   Right Shoulder Internal Rotation  90 Degrees   same as previous   Right Shoulder External Rotation  70 Degrees   same as previous     PROM   Overall PROM Comments  Assessed supine, er/IR adducted  PROM Assessment Site  Shoulder    Right Shoulder Flexion  180 Degrees   169 previous   Right Shoulder ABduction  180 Degrees   same as previous   Right Shoulder Internal Rotation  90 Degrees   same as previous   Right Shoulder External Rotation  80 Degrees   70 previous     Strength   Overall Strength Comments  Assessed seated, er/IR adducted    Strength Assessment Site  Shoulder    Right/Left Shoulder  Right    Right Shoulder Extension  5/5   same as previous   Right Shoulder ABduction  4/5   4-/5 previous   Right Shoulder Internal Rotation  5/5   same as previous   Right Shoulder External Rotation  5/5   4+/5 previous              OT Treatments/Exercises (OP) - 06/13/18 0958      Exercises   Exercises  Shoulder      Shoulder Exercises: Supine   Protraction  PROM;5 reps    Horizontal ABduction  PROM;5 reps    External Rotation   PROM;5 reps   abducted   Internal Rotation  PROM;5 reps   abducted   Flexion  PROM;5 reps    ABduction  PROM;5 reps      Shoulder Exercises: Sidelying   External Rotation  Theraband;12 reps    Theraband Level (Shoulder External Rotation)  Level 2 (Red)    Internal Rotation  Theraband;12 reps    Theraband Level (Shoulder Internal Rotation)  Level 2 (Red)    Flexion  Theraband;12 reps    Theraband Level (Shoulder Flexion)  Level 2 (Red)    ABduction  Theraband;12 reps    Theraband Level (Shoulder ABduction)  Level 2 (Red)    Other Sidelying Exercises  protraction, red theraband, 12X    Other Sidelying Exercises  horizontal abduction, red theraband, 12X      Shoulder Exercises: Standing   Protraction  Theraband;10 reps    Theraband Level (Shoulder Protraction)  Level 3 (Green)    Horizontal ABduction  Theraband;10 reps    Theraband Level (Shoulder Horizontal ABduction)  Level 3 (Green)    External Rotation  Theraband;10 reps    Theraband Level (Shoulder External Rotation)  Level 3 (Green)    Internal Rotation  Theraband;10 reps    Theraband Level (Shoulder Internal Rotation)  Level 3 (Green)    Flexion  Theraband;10 reps    Theraband Level (Shoulder Flexion)  Level 3 (Green)    ABduction  Theraband;10 reps    Theraband Level (Shoulder ABduction)  Level 3 (Green)      Manual Therapy   Manual Therapy  Myofascial release    Manual therapy comments  Manual therapy completed prior to exercises.     Myofascial Release  Myofascial release and manual stretching completed to right upper arm, trapezius, and scapularis region to decrease fascial restrictions and increase joint mobility needed for functional tasks.              OT Education - 06/13/18 1026    Education Details  green theraband strengthening, reviewed strengthening with 2# weights instructed to progress in reps first then in weight    Person(s) Educated  Patient    Methods  Explanation;Demonstration;Handout     Comprehension  Verbalized understanding;Returned demonstration       OT Short Term Goals - 06/13/18 1027      OT SHORT TERM GOAL #1   Title  Pt will be provided with and educated on HEP to improve functional use of RUE as dominant during ADL completion.     Time  4    Period  Weeks    Status  Achieved      OT SHORT TERM GOAL #2   Title  Pt will decrease pain in RUE to 4/10  to improve ability to sleep at night for 5 consecutive hours or greater.     Time  4    Period  Weeks    Status  Achieved      OT SHORT TERM GOAL #3   Title  Pt will improve RUE A/ROM to [facility] to improve ability to use RUE as assist during dressing tasks.     Time  4    Period  Weeks    Status  Achieved        OT Long Term Goals - 06/13/18 1027      OT LONG TERM GOAL #1   Title  Pt will return to highest level of functioning using RUE as dominant during all daily and work tasks.     Time  8    Period  Weeks    Status  Achieved      OT LONG TERM GOAL #2   Title  Pt will decrease pain to 2/10 or less in RUE to improve mobility required for ADL task completion.     Time  8    Period  Weeks    Status  Achieved      OT LONG TERM GOAL #3   Title  Pt will improve RUE A/ROM to WNL to improve mobility required for reaching overhead.     Time  8    Period  Weeks    Status  Achieved      OT LONG TERM GOAL #4   Title  Pt will decrease fascial restrictions in RUE to minimal amounts or less to improve flexibility required for functional reaching tasks.     Time  8    Period  Weeks    Status  Achieved      OT LONG TERM GOAL #5   Title  Pt will improve strength in RUE to 5/5 to increase ability to perform work tasks including overhead lifting.     Time  8    Period  Weeks    Status  Partially Met            Plan - 06/13/18 1027    Clinical Impression Statement  A: Reassessment completed this session, pt has met all goals with exception of strength goal which is partially met. Pt is using RUE for  all ADLs and is completing strengthening HEP, only deficit is abduction strength and activity tolerance, both of which are improving. Pt provided with HEP and educated on continued strengthening at home, including progression of reps then weight. Pt is agreeable to discharge.     Plan  P: Discharge pt       Patient will benefit from skilled therapeutic intervention in order to improve the following deficits and impairments:  Decreased range of motion, Impaired flexibility, Decreased activity tolerance, Increased fascial restrictions, Impaired UE functional use, Pain, Decreased strength  Visit Diagnosis: Acute pain of right shoulder  Stiffness of right shoulder, not elsewhere classified  Other symptoms and signs involving the musculoskeletal system    Problem List Patient Active Problem List   Diagnosis Date Noted  . S/P right rotator cuff repair 03/07/18  03/08/2018  . Traumatic complete tear of right rotator cuff   . Arthrosis of right acromioclavicular joint   . Osteoarthritis of left knee 06/26/2013  . Derangement of posterior horn of medial meniscus 06/26/2013  . Left knee pain 06/26/2013  . Insomnia 04/20/2011  . OSA (obstructive sleep apnea) 04/20/2011  . OBESITY 12/20/2009  . TRANSAMINASES, SERUM, ELEVATED 12/20/2009  . RECTAL BLEEDING 08/13/2008  . HYPERCHOLESTEROLEMIA 08/12/2008  . DEHYDRATION 08/12/2008  . DEPRESSION/ANXIETY 08/12/2008  . ALCOHOL ABUSE 08/12/2008  . HYPERTENSION 08/12/2008  . [facility] 08/12/2008  . GASTROESOPHAGEAL REFLUX DISEASE, CHRONIC 08/12/2008  . FEVER UNSPECIFIED 08/12/2008  . VOMITING 08/12/2008  . DIARRHEA 08/12/2008   Guadelupe Sabin, OTR/L  [phone] 06/13/2018, 10:29 AM  [place], [place], [address] Phone: [phone]   Fax:  [phone]  Name: RONITH BERTI MRN: [id number] Date of Birth: 1960-12-09   OCCUPATIONAL THERAPY DISCHARGE SUMMARY  Visits from Start of  Care: 14  Current functional level related to goals / functional outcomes: See above. Pt is using RUE as dominant for ADL completion and is beginning to use RUE for lightweight lifting tasks. Pt is agreeable to discharge with HEP.    Remaining deficits: Abduction strength at 4/5; er at 4+/5; activity tolerance   Education / Equipment: HEP for continued strengthening and activity tolerance  Plan: Patient agrees to discharge.  Patient goals were met. Patient is being discharged due to meeting the stated rehab goals.  ?????

## 2018-06-24 ENCOUNTER — Other Ambulatory Visit: Payer: Self-pay | Admitting: Orthopedic Surgery

## 2018-06-24 ENCOUNTER — Telehealth (HOSPITAL_COMMUNITY): Payer: Self-pay | Admitting: Family Medicine

## 2018-06-24 MED ORDER — HYDROCODONE-ACETAMINOPHEN 7.5-325 MG PO TABS: 1.0000 | ORAL_TABLET | Freq: Four times a day (QID) | ORAL | 0 refills | Status: DC | PRN

## 2018-06-24 NOTE — Telephone Encounter (Signed)
Patient requests refill on Hydrocodone/Acetaminophen 7.5-325  Mgs.  Qty 42  Sig: Take 1 tablet by mouth every 6 (six) hours as needed for moderate pain.  Patient states he uses [facility] in [place]

## 2018-06-24 NOTE — Telephone Encounter (Signed)
06/24/18  I left patient a message about the insurance not covering for his visits in February and also gave him the account total of $1236.  I also gave him the phone number to call if he wanted to set up payments.  I told him if there was anything that we could do to help or if he had questions to please call our office.

## 2018-07-08 ENCOUNTER — Other Ambulatory Visit: Payer: Self-pay | Admitting: Orthopedic Surgery

## 2018-07-08 MED ORDER — HYDROCODONE-ACETAMINOPHEN 7.5-325 MG PO TABS: 1.0000 | ORAL_TABLET | Freq: Four times a day (QID) | ORAL | 0 refills | Status: DC | PRN

## 2018-07-08 NOTE — Telephone Encounter (Signed)
Patient requests refill, pain medication; states has new pharmacy insurance, and therefore was dispensed 28 pills of medication: HYDROcodone-acetaminophen ([name]) 7.5-325 MG tablet 42 tablet   - [name]

## 2018-07-11 ENCOUNTER — Telehealth (HOSPITAL_COMMUNITY): Payer: Self-pay | Admitting: Emergency Medicine

## 2018-07-11 NOTE — Telephone Encounter (Signed)
Left message on voicemail with name and callback number Niasia Lanphear RN Navigator Cardiac [facility] [phone] Office [phone] Cell  

## 2018-07-15 ENCOUNTER — Ambulatory Visit (HOSPITAL_COMMUNITY): Payer: BLUE CROSS/BLUE SHIELD

## 2018-07-24 ENCOUNTER — Telehealth (INDEPENDENT_AMBULATORY_CARE_PROVIDER_SITE_OTHER): Payer: BLUE CROSS/BLUE SHIELD | Admitting: Orthopedic Surgery

## 2018-07-24 ENCOUNTER — Other Ambulatory Visit: Payer: Self-pay

## 2018-07-24 DIAGNOSIS — Z9889 Other specified postprocedural states: Secondary | ICD-10-CM

## 2018-07-24 NOTE — Progress Notes (Signed)
I connected with Ryan Hickman  on 07/24/18 at  2:00 PM EDT by telephone and verified that I am speaking with the correct person using two identifiers.   I discussed the limitations, risks, security and privacy concerns of performing an evaluation and management service by telephone and the availability of in person appointments. I also discussed with the patient that there may be a patient responsible charge related to this service. The patient expressed understanding and agreed to proceed.   Progress Note   Patient ID: Ryan Hickman, male   DOB: 1960/06/24, 58 y.o.   MRN: [id number]   Chief complaint rotator cuff repair November 2019, shoulder pain  [place] is doing well he says he has excellent motion.  He is able to sleep at night although he still hurting some with a dull ache during the day for which he still takes his hydrocodone 7.5 mg  03/07/2018  10:12 AM  PATIENT:  Ryan Hickman  58 y.o. 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 Daypro [Oxaprozin] Anaphylaxis  . Hysingla Er [Hydrocodone Bitartrate Er] Hives  . Levofloxacin Hives     There were no vitals taken for this visit.  Physical Exam    Encounter Diagnosis  Name Primary?  . S/P right rotator cuff repair 03/07/18 Yes    PLAN:   We spoke specifically about trying to stop taking this medication as it is an opioid and  it has side effects.  He says he does not take it that often but it does help him sleep at the end of the day.  He continues to maintain his range of motion which is excellent  I asked him to try to use a topical medication such as Biofreeze he says he will try some icy hot  I will virtual visit him in 4 weeks and we will discuss his return to work status.    Arther Abbott, MD 07/24/2018 2:13 PM  Follow Up Instructions:    I discussed the assessment and treatment plan with the patient. The patient was provided an opportunity to ask questions and all were answered. The patient agreed with the plan and demonstrated an understanding of the instructions.   The patient was advised to call back or seek an in-person evaluation if the symptoms worsen or if the condition fails to improve as anticipated.  I provided 8 minutes of non-face-to-face time during this encounter.

## 2018-07-25 ENCOUNTER — Other Ambulatory Visit: Payer: Self-pay | Admitting: Orthopedic Surgery

## 2018-07-25 ENCOUNTER — Encounter: Payer: Self-pay | Admitting: Orthopedic Surgery

## 2018-07-25 NOTE — Telephone Encounter (Signed)
Patient came by office; picked up work note as per Dr Aline Brochure 07/24/18; asking if may have refill:  HYDROcodone-acetaminophen ([place]) 7.5-325 MG tablet 42 tablet   [place]

## 2018-07-26 DIAGNOSIS — E7849 Other hyperlipidemia: Secondary | ICD-10-CM | POA: Diagnosis not present

## 2018-07-26 DIAGNOSIS — Z6835 Body mass index (BMI) 35.0-35.9, adult: Secondary | ICD-10-CM | POA: Diagnosis not present

## 2018-07-26 DIAGNOSIS — I10 Essential (primary) hypertension: Secondary | ICD-10-CM | POA: Diagnosis not present

## 2018-07-26 DIAGNOSIS — Z1389 Encounter for screening for other disorder: Secondary | ICD-10-CM | POA: Diagnosis not present

## 2018-07-26 DIAGNOSIS — E6609 Other obesity due to excess calories: Secondary | ICD-10-CM | POA: Diagnosis not present

## 2018-07-26 MED ORDER — HYDROCODONE-ACETAMINOPHEN 7.5-325 MG PO TABS: 1.0000 | ORAL_TABLET | Freq: Four times a day (QID) | ORAL | 0 refills | Status: DC | PRN

## 2018-08-12 ENCOUNTER — Other Ambulatory Visit (HOSPITAL_COMMUNITY): Payer: BLUE CROSS/BLUE SHIELD

## 2018-08-12 ENCOUNTER — Ambulatory Visit (HOSPITAL_COMMUNITY): Payer: BLUE CROSS/BLUE SHIELD

## 2018-08-16 ENCOUNTER — Other Ambulatory Visit: Payer: Self-pay | Admitting: Orthopedic Surgery

## 2018-08-16 NOTE — Telephone Encounter (Signed)
Patient requests refill on Hydrodcodone/Acetaminophen 7.5-325  Mgs.  Qty  42       Sig: Take 1 tablet by mouth every 6 (six) hours as needed for moderate pain.     Patient states he uses [facility] in [place]   Refill on Ibuprofen 800 mgs.  Sig: Take 1 tablet (800 mg total) by mouth every 8 (eight) hours as needed

## 2018-08-18 ENCOUNTER — Encounter (HOSPITAL_COMMUNITY): Payer: Self-pay | Admitting: Emergency Medicine

## 2018-08-18 ENCOUNTER — Emergency Department (HOSPITAL_COMMUNITY)
Admission: EM | Admit: 2018-08-18 | Discharge: 2018-08-18 | Disposition: A | Discharge status: Home / Self Care | Payer: BLUE CROSS/BLUE SHIELD | Attending: Emergency Medicine | Admitting: Emergency Medicine

## 2018-08-18 ENCOUNTER — Other Ambulatory Visit: Payer: Self-pay

## 2018-08-18 DIAGNOSIS — R404 Transient alteration of awareness: Secondary | ICD-10-CM | POA: Diagnosis not present

## 2018-08-18 DIAGNOSIS — R0600 Dyspnea, unspecified: Secondary | ICD-10-CM | POA: Diagnosis not present

## 2018-08-18 DIAGNOSIS — R52 Pain, unspecified: Secondary | ICD-10-CM | POA: Diagnosis not present

## 2018-08-18 DIAGNOSIS — Z79899 Other long term (current) drug therapy: Secondary | ICD-10-CM | POA: Diagnosis not present

## 2018-08-18 DIAGNOSIS — E1165 Type 2 diabetes mellitus with hyperglycemia: Secondary | ICD-10-CM | POA: Diagnosis not present

## 2018-08-18 DIAGNOSIS — T507X4A Poisoning by analeptics and opioid receptor antagonists, undetermined, initial encounter: Secondary | ICD-10-CM | POA: Insufficient documentation

## 2018-08-18 DIAGNOSIS — I10 Essential (primary) hypertension: Secondary | ICD-10-CM | POA: Diagnosis not present

## 2018-08-18 DIAGNOSIS — F17210 Nicotine dependence, cigarettes, uncomplicated: Secondary | ICD-10-CM | POA: Insufficient documentation

## 2018-08-18 DIAGNOSIS — T402X4A Poisoning by other opioids, undetermined, initial encounter: Secondary | ICD-10-CM | POA: Diagnosis not present

## 2018-08-18 DIAGNOSIS — G8929 Other chronic pain: Secondary | ICD-10-CM | POA: Diagnosis not present

## 2018-08-18 DIAGNOSIS — M25511 Pain in right shoulder: Secondary | ICD-10-CM | POA: Insufficient documentation

## 2018-08-18 DIAGNOSIS — T50904A Poisoning by unspecified drugs, medicaments and biological substances, undetermined, initial encounter: Secondary | ICD-10-CM | POA: Diagnosis not present

## 2018-08-18 DIAGNOSIS — R739 Hyperglycemia, unspecified: Secondary | ICD-10-CM | POA: Insufficient documentation

## 2018-08-18 DIAGNOSIS — T40604A Poisoning by unspecified narcotics, undetermined, initial encounter: Secondary | ICD-10-CM

## 2018-08-18 LAB — COMPREHENSIVE METABOLIC PANEL
ALT: 77 U/L — ABNORMAL HIGH (ref 0–44)
AST: 66 U/L — ABNORMAL HIGH (ref 15–41)
Albumin: 4.1 g/dL (ref 3.5–5.0)
Alkaline Phosphatase: 67 U/L (ref 38–126)
Anion gap: 11 (ref 5–15)
BUN: 23 mg/dL — ABNORMAL HIGH (ref 6–20)
CO2: 29 mmol/L (ref 22–32)
Calcium: 9 mg/dL (ref 8.9–10.3)
Chloride: 98 mmol/L (ref 98–111)
Creatinine, Ser: 1.65 mg/dL — ABNORMAL HIGH (ref 0.61–1.24)
GFR calc Af Amer: 53 mL/min — ABNORMAL LOW (ref >60)
GFR calc non Af Amer: 46 mL/min — ABNORMAL LOW (ref >60)
Glucose, Bld: 291 mg/dL — ABNORMAL HIGH (ref 70–99)
Potassium: 4 mmol/L (ref 3.5–5.1)
Sodium: 138 mmol/L (ref 135–145)
Total Bilirubin: 0.6 mg/dL (ref 0.3–1.2)
Total Protein: 7.2 g/dL (ref 6.5–8.1)

## 2018-08-18 LAB — URINALYSIS, ROUTINE W REFLEX MICROSCOPIC
Bilirubin Urine: NEGATIVE
Glucose, UA: >=500 mg/dL — AB
Hgb urine dipstick: NEGATIVE
Ketones, ur: NEGATIVE mg/dL
Leukocytes,Ua: NEGATIVE
Nitrite: NEGATIVE
Protein, ur: NEGATIVE mg/dL
Specific Gravity, Urine: 1.012 (ref 1.005–1.030)
pH: 6 (ref 5.0–8.0)

## 2018-08-18 LAB — CBC WITH DIFFERENTIAL/PLATELET
Abs Immature Granulocytes: 0.14 10*3/uL — ABNORMAL HIGH (ref 0.00–0.07)
Basophils Absolute: 0 10*3/uL (ref 0.0–0.1)
Basophils Relative: 0 %
Eosinophils Absolute: 0.2 10*3/uL (ref 0.0–0.5)
Eosinophils Relative: 2 %
HCT: 49.6 % (ref 39.0–52.0)
Hemoglobin: 16.2 g/dL (ref 13.0–17.0)
Immature Granulocytes: 1 %
Lymphocytes Relative: 14 %
Lymphs Abs: 1.5 10*3/uL (ref 0.7–4.0)
MCH: 31.6 pg (ref 26.0–34.0)
MCHC: 32.7 g/dL (ref 30.0–36.0)
MCV: 96.9 fL (ref 80.0–100.0)
Monocytes Absolute: 0.4 10*3/uL (ref 0.1–1.0)
Monocytes Relative: 3 %
Neutro Abs: 8.5 10*3/uL — ABNORMAL HIGH (ref 1.7–7.7)
Neutrophils Relative %: 80 %
Platelets: 185 10*3/uL (ref 150–400)
RBC: 5.12 MIL/uL (ref 4.22–5.81)
RDW: 12.9 % (ref 11.5–15.5)
WBC: 10.8 10*3/uL — ABNORMAL HIGH (ref 4.0–10.5)
nRBC: 0 % (ref 0.0–0.2)

## 2018-08-18 LAB — RAPID URINE DRUG SCREEN, HOSP PERFORMED
Amphetamines: NOT DETECTED
Barbiturates: NOT DETECTED
Benzodiazepines: POSITIVE — AB
Cocaine: NOT DETECTED
Opiates: NOT DETECTED
Tetrahydrocannabinol: NOT DETECTED

## 2018-08-18 LAB — TROPONIN I: Troponin I: <0.03 ng/mL (ref <0.03)

## 2018-08-18 LAB — ETHANOL: Alcohol, Ethyl (B): <10 mg/dL (ref <10)

## 2018-08-18 LAB — LIPASE, BLOOD: Lipase: 47 U/L (ref 11–51)

## 2018-08-18 MED ORDER — SODIUM CHLORIDE 0.9 % IV BOLUS
1000.0000 mL | Freq: Once | INTRAVENOUS | Status: AC
  Administered 2018-08-18: 1000 mL via INTRAVENOUS

## 2018-08-18 NOTE — Discharge Instructions (Addendum)
Suggest AA or NA meetings.  Also consider going to [facility] for counseling.  Glucose today was 291.  This was very high.  You will need to have your glucose rechecked in 1 week.

## 2018-08-18 NOTE — ED Provider Notes (Signed)
[facility] EMERGENCY DEPARTMENT Provider Note   CSN: [id number] Arrival date & time: 08/18/18  1312    History   Chief Complaint Chief Complaint  Patient presents with  . Drug Overdose    HPI Ryan Hickman is a 58 y.o. male.     Level 5 caveat for acuity of condition.  Patient arrived via EMS.  Apparently he was snorting some oxycodone earlier this afternoon.  He was found with sonorous respirations in the home.  EMS administered 2 mg of Narcan intranasally and supplemental oxygen.  This seemed to help immensely.  Upon arrival in the ED, he was more alert.  Admits to having a drinking problem, but no alcohol in the past 2 days.  He describes multiple stressors at home.  Review of systems positive for chronic sore right shoulder.     Past Medical History:  Diagnosis Date  . Acid reflux   . Arthritis   . Childhood asthma    as child  . Depression   . History of ETOH abuse   . Hyperlipidemia   . Hypertension   . Insomnia   . Sleep apnea    uses CPAP, cannot tolerate  . Urticaria     Patient Active Problem List   Diagnosis Date Noted  . S/P right rotator cuff repair 03/07/18 03/08/2018  . Traumatic complete tear of right rotator cuff   . Arthrosis of right acromioclavicular joint   . Osteoarthritis of left knee 06/26/2013  . Derangement of posterior horn of medial meniscus 06/26/2013  . Left knee pain 06/26/2013  . Insomnia 04/20/2011  . OSA (obstructive sleep apnea) 04/20/2011  . OBESITY 12/20/2009  . TRANSAMINASES, SERUM, ELEVATED 12/20/2009  . RECTAL BLEEDING 08/13/2008  . HYPERCHOLESTEROLEMIA 08/12/2008  . DEHYDRATION 08/12/2008  . DEPRESSION/ANXIETY 08/12/2008  . ALCOHOL ABUSE 08/12/2008  . HYPERTENSION 08/12/2008  . [facility] 08/12/2008  . GASTROESOPHAGEAL REFLUX DISEASE, CHRONIC 08/12/2008  . FEVER UNSPECIFIED 08/12/2008  . VOMITING 08/12/2008  . DIARRHEA 08/12/2008    Past Surgical History:  Procedure Laterality Date  . COLONOSCOPY  May 2010   Dr.  Oneida Alar: simple adenoma, normal colon. Repeat 2020  . EAR CYST EXCISION N/A 11/14/2013   Procedure: EXCISION SEBACEOUS CYST CHEST WALL;  Surgeon: Jamesetta So, MD;  Location: [facility];  Service: General;  Laterality: N/A;  . KNEE ARTHROSCOPY WITH MEDIAL MENISECTOMY Left 08/24/2016   Procedure: KNEE ARTHROSCOPY WITH MEDIAL MENISECTOMY;  Surgeon: Carole Civil, MD;  Location: [facility];  Service: Orthopedics;  Laterality: Left;  . KNEE SURGERY Right 1992  . SHOULDER OPEN ROTATOR CUFF REPAIR Right 03/07/2018   Procedure: ROTATOR CUFF REPAIR SHOULDER OPEN with distal clavicle excison superior capsular reconstruction;  Surgeon: Carole Civil, MD;  Location: [facility];  Service: Orthopedics;  Laterality: Right;  . UPPER GASTROINTESTINAL ENDOSCOPY  May 2010   Dr. Oneida Alar: normal        Home Medications    Prior to Admission medications   Medication Sig Start Date End Date Taking? Authorizing Provider  amLODipine-olmesartan (AZOR) 10-40 MG tablet Take 1 tablet by mouth at bedtime.  05/20/17  Yes [provider]  atorvastatin (LIPITOR) 80 MG tablet Take 80 mg by mouth daily before breakfast.  11/04/15  Yes [provider]  buPROPion (WELLBUTRIN XL) 300 MG 24 hr tablet Take 300 mg daily by mouth. 02/21/17  Yes [provider]  hydrochlorothiazide (HYDRODIURIL) 25 MG tablet Take 25 mg by mouth daily.   Yes [provider]  HYDROcodone-acetaminophen (  NORCO) 7.5-325 MG tablet Take 1 tablet by mouth every 6 (six) hours as needed for moderate pain. 07/26/18  Yes Carole Civil, MD  ibuprofen (ADVIL) 800 MG tablet TAKE 1 TABLET BY MOUTH EVERY 8 HOURS AS NEEDED 08/17/18  Yes Carole Civil, MD  LORazepam (ATIVAN) 2 MG tablet Take 3 mg by mouth at bedtime.    Yes [provider]  metoprolol succinate (TOPROL-XL) 100 MG 24 hr tablet Take 100 mg by mouth daily. 05/23/18  Yes [provider]  omeprazole (PRILOSEC) 20 MG capsule Take 20 mg by mouth daily  before breakfast.    Yes [provider]  disulfiram (ANTABUSE) 250 MG tablet Take 500 mg by mouth daily.  01/11/18   [provider]  meloxicam (MOBIC) 15 MG tablet Take 15 mg by mouth at bedtime.  07/20/16   [provider]  QUEtiapine (SEROQUEL) 25 MG tablet Take 25 mg by mouth at bedtime as needed (for sleep).  03/07/18   [provider]    Family History Family History  Problem Relation Age of Onset  . Heart failure Other   . Hypertension Mother   . Arrhythmia Mother   . Hypertension Father   . Atrial fibrillation Sister   . Colon cancer Neg Hx   . Allergic rhinitis Neg Hx   . Asthma Neg Hx   . Eczema Neg Hx   . Urticaria Neg Hx     Social History Social History   Tobacco Use  . Smoking status: Current Every Day Smoker    Packs/day: 1.00    Years: 25.00    Pack years: 25.00    Types: Cigarettes  . Smokeless tobacco: Never Used  Substance Use Topics  . Alcohol use: Not Currently    Alcohol/week: 8.0 standard drinks    Types: 8 Cans of beer per week  . Drug use: No    Comment: opiates     Allergies   Daypro [oxaprozin]; Hysingla er [hydrocodone bitartrate er]; and Levofloxacin   Review of Systems Review of Systems  Unable to perform ROS: Acuity of condition     Physical Exam Updated Vital Signs BP 99/63   Pulse 64   Temp 97.8 F (36.6 C) (Oral)   Resp 16   Ht 5\' 9"  (1.753 m)   Wt 113.4 kg   SpO2 94%   BMI 36.92 kg/m   Physical Exam Vitals signs and nursing note reviewed.  Constitutional:      Appearance: He is well-developed.  HENT:     Head: Normocephalic and atraumatic.  Eyes:     Conjunctiva/sclera: Conjunctivae normal.  Neck:     Musculoskeletal: Neck supple.  Cardiovascular:     Rate and Rhythm: Normal rate and regular rhythm.  Pulmonary:     Effort: Pulmonary effort is normal.     Breath sounds: Normal breath sounds.  Abdominal:     General: Bowel sounds are normal.     Palpations: Abdomen is  soft.  Musculoskeletal: Normal range of motion.  Skin:    General: Skin is warm and dry.  Neurological:     Mental Status: He is alert and oriented to person, place, and time.  Psychiatric:        Behavior: Behavior normal.      ED Treatments / Results  Labs (all labs ordered are listed, but only abnormal results are displayed) Labs Reviewed  CBC WITH DIFFERENTIAL/PLATELET - Abnormal; Notable for the following components:      Result  Value   WBC 10.8 (*)    Neutro Abs 8.5 (*)    Abs Immature Granulocytes 0.14 (*)    All other components within normal limits  COMPREHENSIVE METABOLIC PANEL - Abnormal; Notable for the following components:   Glucose, Bld 291 (*)    BUN 23 (*)    Creatinine, Ser 1.65 (*)    AST 66 (*)    ALT 77 (*)    GFR calc non Af Amer 46 (*)    GFR calc Af Amer 53 (*)    All other components within normal limits  RAPID URINE DRUG SCREEN, HOSP PERFORMED - Abnormal; Notable for the following components:   Benzodiazepines POSITIVE (*)    All other components within normal limits  URINALYSIS, ROUTINE W REFLEX MICROSCOPIC - Abnormal; Notable for the following components:   APPearance HAZY (*)    Glucose, UA >=500 (*)    Bacteria, UA RARE (*)    All other components within normal limits  LIPASE, BLOOD  ETHANOL  TROPONIN I    EKG EKG Interpretation  Date/Time:  Sunday August 18 2018 13:21:14 EDT Ventricular Rate:  67 PR Interval:    QRS Duration: 159 QT Interval:  436 QTC Calculation: 461 R Axis:   87 Text Interpretation:  Sinus rhythm Right bundle branch block Confirmed by Nat Christen [phone]) on 08/18/2018 2:29:20 PM   Radiology No results found.  Procedures Procedures (including critical care time)  Medications Ordered in ED Medications  sodium chloride 0.9 % bolus 1,000 mL (0 mLs Intravenous Stopped 08/18/18 1440)  sodium chloride 0.9 % bolus 1,000 mL (0 mLs Intravenous Stopped 08/18/18 1714)     Initial Impression / Assessment and Plan /  ED Course  I have reviewed the triage vital signs and the nursing notes.  Pertinent labs & imaging results that were available during my care of the patient were reviewed by me and considered in my medical decision making (see chart for details).        Patient presents to the ED after snorting opiates.  He responded well to intranasal Narcan.  We discussed treatment options including AA and NA.  Patient was observed for several hours and was hemodynamically stable at discharge.  Discussed elevated glucose.  He will get repeat test in 1 to 2 weeks  Final Clinical Impressions(s) / ED Diagnoses   Final diagnoses:  Opiate overdose, undetermined intent, initial encounter [facility])  Hyperglycemia    ED Discharge Orders    None       Nat Christen, MD 08/18/18 [phone]

## 2018-08-18 NOTE — ED Triage Notes (Signed)
PT arrives from home by [facility]. PT states he was in the bathroom and snorted some oxycodone this am. PT denies any SI/HI. PT states he hasn't drank in the past two days and has had nausea from stopping drinking recently and took the oxycodone due to chronic right shoulder pain. 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 PT alert and oriented upon arrival to ED today.

## 2018-08-19 ENCOUNTER — Other Ambulatory Visit: Payer: Self-pay | Admitting: Orthopedic Surgery

## 2018-08-19 ENCOUNTER — Telehealth: Payer: Self-pay | Admitting: Radiology

## 2018-08-19 MED ORDER — HYDROCODONE-ACETAMINOPHEN 5-325 MG PO TABS: 1.0000 | ORAL_TABLET | Freq: Four times a day (QID) | ORAL | 0 refills | Status: AC | PRN

## 2018-08-19 NOTE — Progress Notes (Signed)
Decrease meds opioids

## 2018-08-19 NOTE — Telephone Encounter (Signed)
Due to insurance regulations, patient could only get #28 of the Hydrocodone To you FYI

## 2018-08-21 ENCOUNTER — Ambulatory Visit (INDEPENDENT_AMBULATORY_CARE_PROVIDER_SITE_OTHER): Payer: BLUE CROSS/BLUE SHIELD | Admitting: Orthopedic Surgery

## 2018-08-21 ENCOUNTER — Other Ambulatory Visit: Payer: Self-pay

## 2018-08-21 ENCOUNTER — Encounter: Payer: Self-pay | Admitting: Orthopedic Surgery

## 2018-08-21 DIAGNOSIS — Z9889 Other specified postprocedural states: Secondary | ICD-10-CM

## 2018-08-21 NOTE — Progress Notes (Signed)
Virtual Visit via Telephone Note  I connected with Ryan Hickman on 08/21/18 at 11:50 AM EDT by telephone and verified that I am speaking with the correct person using two identifiers.   I discussed the limitations, risks, security and privacy concerns of performing an evaluation and management service by telephone and the availability of in person appointments. I also discussed with the patient that there may be a patient responsible charge related to this service. The patient expressed understanding and agreed to proceed.     I discussed the assessment and treatment plan with the patient. The patient was provided an opportunity to ask questions and all were answered. The patient agreed with the plan and demonstrated an understanding of the instructions.   The patient was advised to call back or seek an in-person evaluation if the symptoms worsen or if the condition fails to improve as anticipated.  I provided 5 minutes of non-face-to-face time during this encounter.  Chief Complaint  Patient presents with  . Shoulder Problem    Date of surgery November 2019 right rotator cuff repair     58 year old male had his rotator cuff repaired right shoulder in November 2019  He says he is doing well he has 100% return of motion he is followed a 10 pound lifting restrictions he is done some light activities at home  However he says he does still have pain depending on which position he puts his shoulder in.  The pain is not over the surgical site top of the shoulder anteriorly but it is more around the scapula and posterior part of the joint.    Encounter Diagnosis  Name Primary?  . S/P right rotator cuff repair 03/07/18 Yes     Recommend follow-up in a month in person  Recommend out of work extension until May 27 at which time we will reconsider his return to work date  Arther Abbott, MD

## 2018-08-27 ENCOUNTER — Ambulatory Visit (HOSPITAL_COMMUNITY): Payer: BLUE CROSS/BLUE SHIELD

## 2018-09-18 ENCOUNTER — Ambulatory Visit (INDEPENDENT_AMBULATORY_CARE_PROVIDER_SITE_OTHER): Payer: BLUE CROSS/BLUE SHIELD | Admitting: Orthopedic Surgery

## 2018-09-18 ENCOUNTER — Encounter: Payer: Self-pay | Admitting: Orthopedic Surgery

## 2018-09-18 ENCOUNTER — Other Ambulatory Visit: Payer: Self-pay

## 2018-09-18 VITALS — BP 116/74 | HR 61 | Temp 97.7°F | Ht 69.0 in | Wt 248.0 lb

## 2018-09-18 DIAGNOSIS — Z9889 Other specified postprocedural states: Secondary | ICD-10-CM

## 2018-09-18 MED ORDER — HYDROCODONE-ACETAMINOPHEN 5-325 MG PO TABS: 1.0000 | ORAL_TABLET | Freq: Four times a day (QID) | ORAL | 0 refills | Status: AC | PRN

## 2018-09-18 NOTE — Patient Instructions (Signed)
RETURN TO WORK June 1ST

## 2018-09-18 NOTE — Progress Notes (Signed)
Progress Note   Patient ID: Ryan Hickman, male   DOB: Aug 17, 1960, 58 y.o.   MRN: [id number]   Chief Complaint  Patient presents with  . Shoulder Pain    improving wants to return to work by June 1st, works at [employer]   . Routine Post Op    03/07/18 right RCR    57 s/p rcr rt shoulder WITH CAPSULAR RECONSTRUCTION ; doing well , wants a refill for pain as he is going back to work, I ve discussed opioids with him before       ROS NON CONTRIBUTORY   Allergies  Allergen Reactions  . Daypro [Oxaprozin] Anaphylaxis  . Hysingla Er [Hydrocodone Bitartrate Er] Hives  . Levofloxacin Hives     BP 116/74   Pulse 61   Temp 97.7 F (36.5 C)   Ht 5\' 9"  (1.753 m)   Wt 248 lb (112.5 kg)   BMI 36.62 kg/m   Physical Exam Musculoskeletal:       Arms:      Medical decisions:   Data  Imaging:   N/A  Encounter Diagnosis  Name Primary?  . S/P right rotator cuff repair 03/07/18 Yes    PLAN:  RTW: June 1  RTC PRN    Arther Abbott, MD 09/18/2018 9:06 AM

## 2018-09-23 ENCOUNTER — Encounter: Payer: Self-pay | Admitting: Gastroenterology

## 2018-11-07 DIAGNOSIS — I10 Essential (primary) hypertension: Secondary | ICD-10-CM | POA: Diagnosis not present

## 2018-11-07 DIAGNOSIS — G4709 Other insomnia: Secondary | ICD-10-CM | POA: Diagnosis not present

## 2018-11-07 DIAGNOSIS — Z1389 Encounter for screening for other disorder: Secondary | ICD-10-CM | POA: Diagnosis not present

## 2018-11-07 DIAGNOSIS — Z6834 Body mass index (BMI) 34.0-34.9, adult: Secondary | ICD-10-CM | POA: Diagnosis not present

## 2018-11-07 DIAGNOSIS — E6609 Other obesity due to excess calories: Secondary | ICD-10-CM | POA: Diagnosis not present

## 2018-12-14 IMAGING — MR MR SHOULDER*L* W/ CM
4 of 5 series · 18 of 40 positions shown · IV contrast (agent unspecified)
Comparison: Image from contrast injection reviewed. Plain films
left shoulder 02/28/2017.

CLINICAL DATA: Left shoulder pain for 6 months.  No known injury.

EXAM:
MR ARTHROGRAM OF THE LEFT SHOULDER
TECHNIQUE: Multiplanar, multisequence MR imaging of the left shoulder was
performed following the administration of intra-articular contrast.
CONTRAST:  See Injection Documentation.

[Series 5: T1 · axial · 3.0mm · 0.25mm/px · z∈[-55,+31]mm · 8 of 26 slices shown (1 of 3)]
[im 1/26]
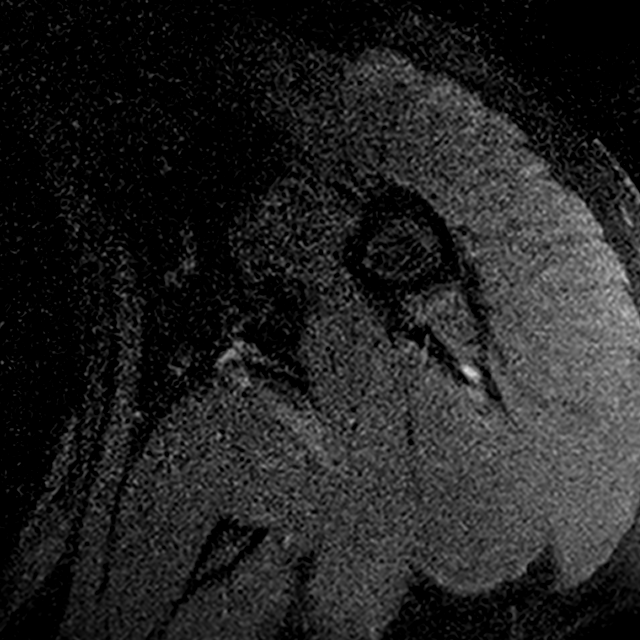
[im 4/26]
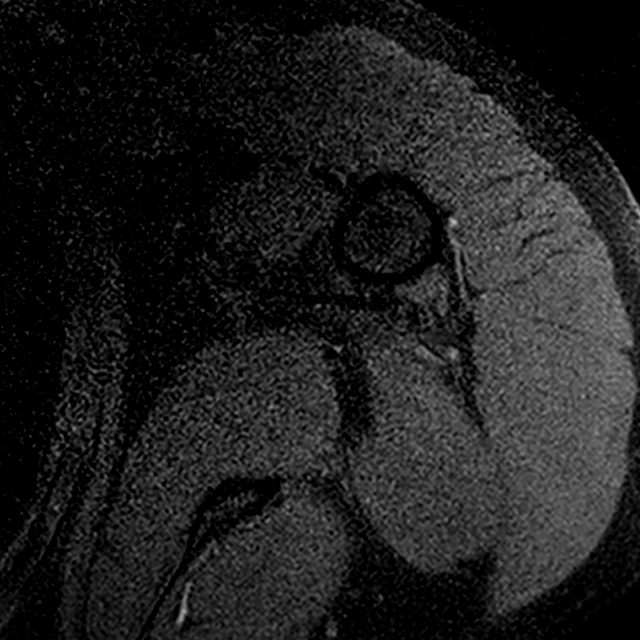
[im 8/26]
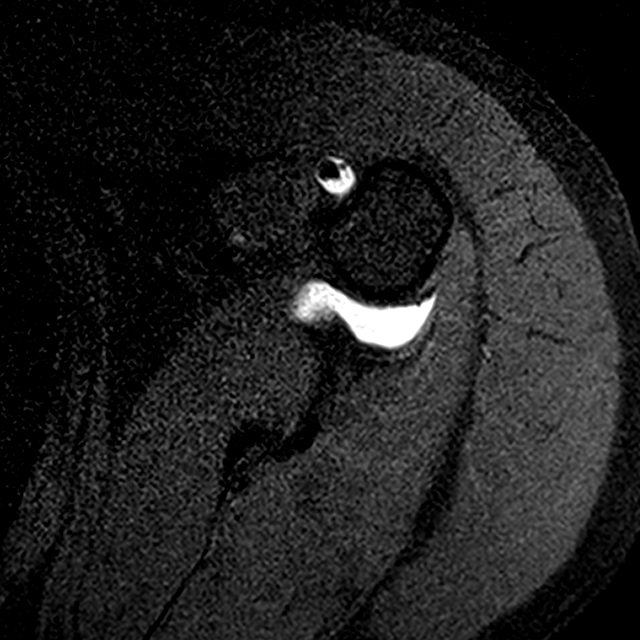
[im 11/26]
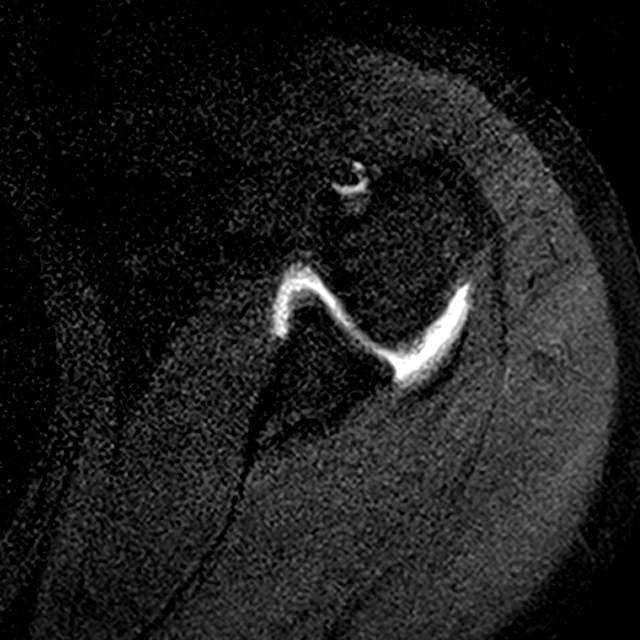
[im 15/26]
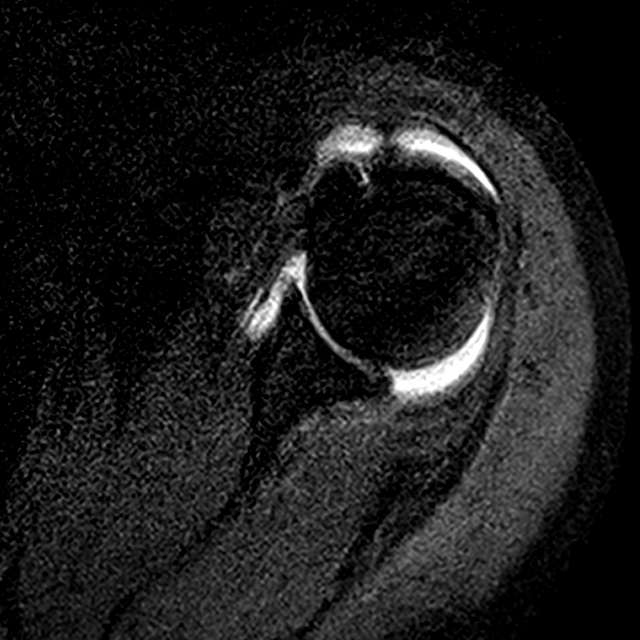
[im 18/26]
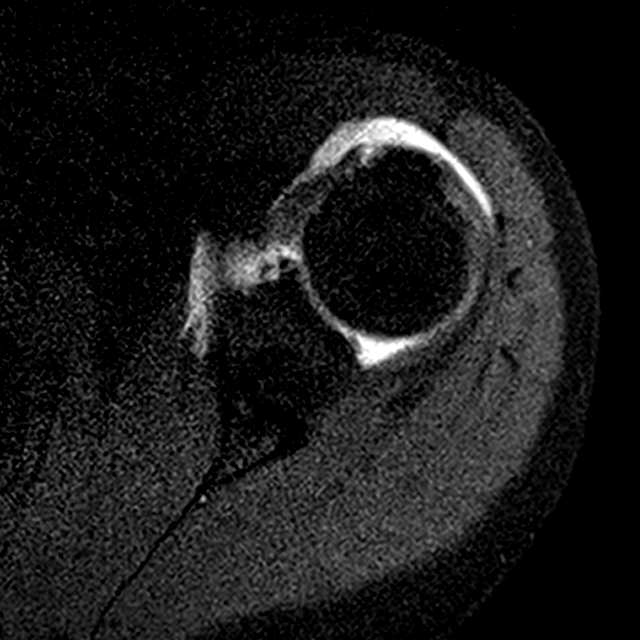
[im 22/26]
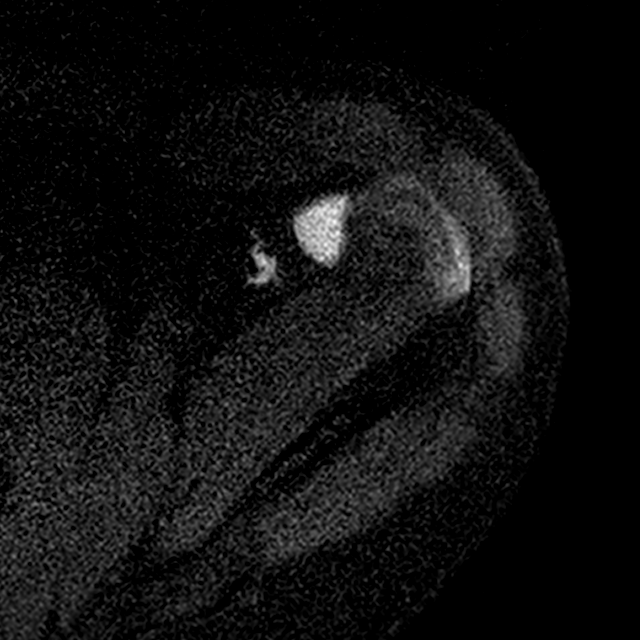
[im 26/26]
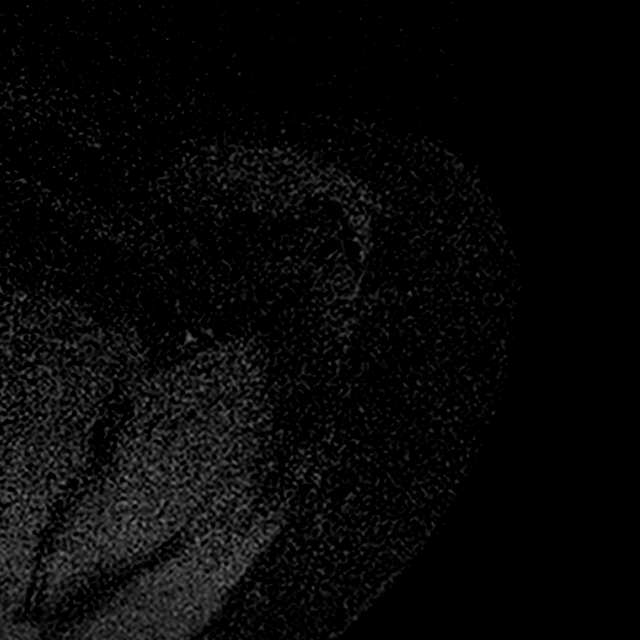

[Series 6: T1 · sagittal · 3.0mm · 0.26mm/px · 4 of 22 slices shown (2 of 3)]
[im 1/22]
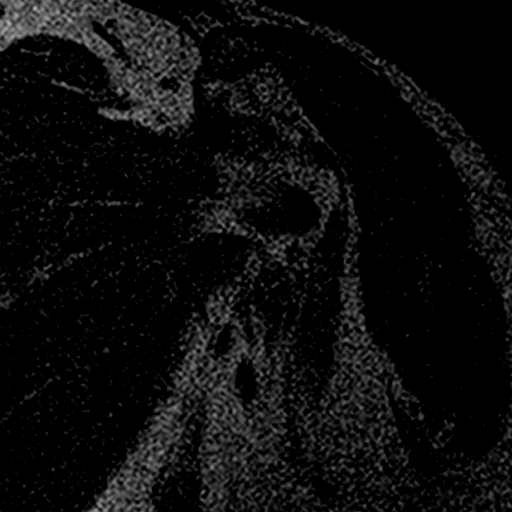
[im 4/22]
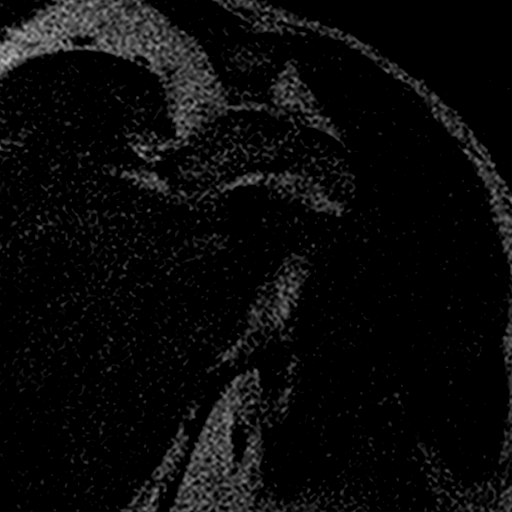
[im 11/22]
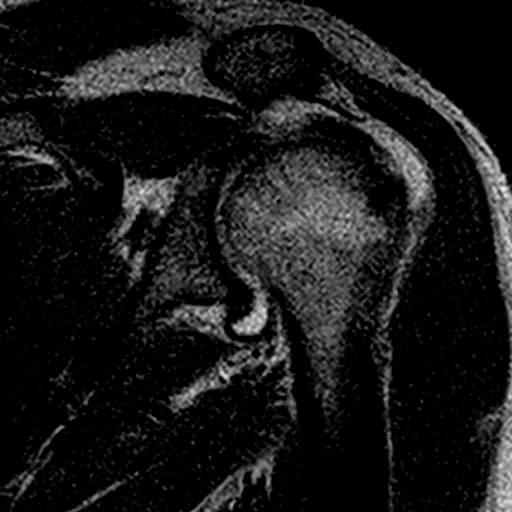
[im 18/22]
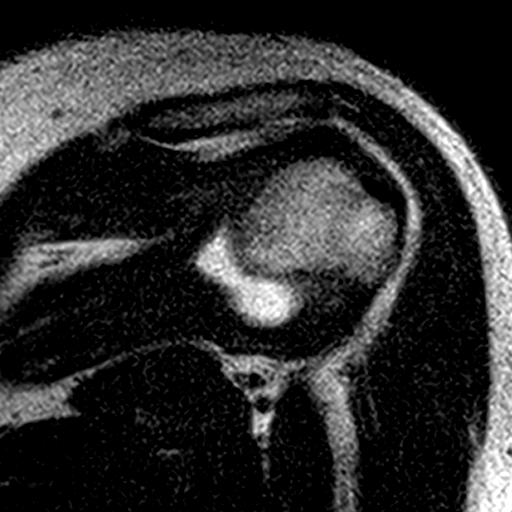

[Series 9: T1 · sagittal · 3.0mm · 0.26mm/px · 3 of 22 slices shown (3 of 3)]
[im 4/22]
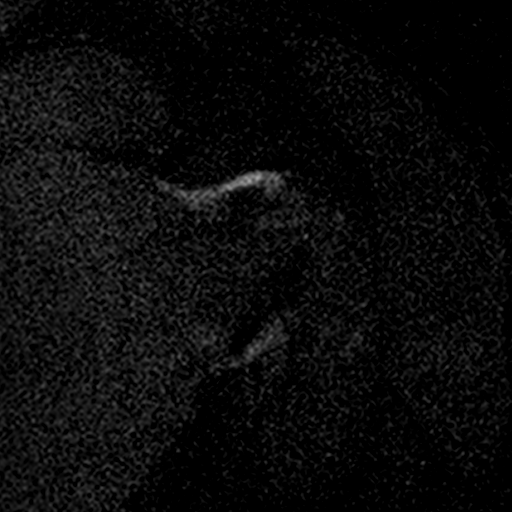
[im 11/22]
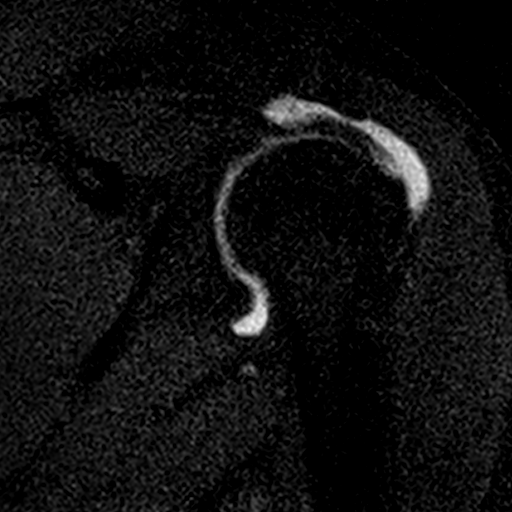
[im 18/22]
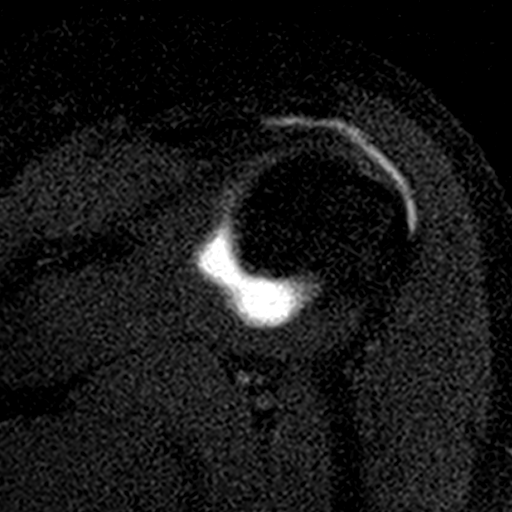

[Series 12: [id number] · coronal · 3.0mm · 0.55mm/px · 3 of 26 slices shown]
[im 4/26]
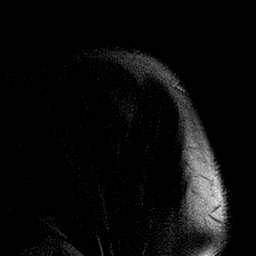
[im 13/26]
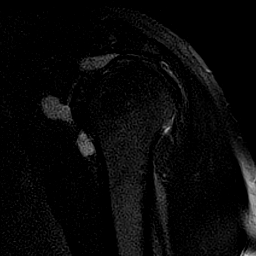
[im 22/26]
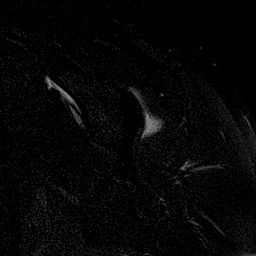

[18 of 40 positions shown; findings below may reference images not displayed]

FINDINGS: This examination is degraded by patient motion.

Rotator cuff: The patient has rotator cuff tendinopathy with a tear
of the anterior and far lateral supraspinatus measuring
approximately 1.2 cm from front to back. The bulk of the tear is
full-thickness. Retraction is mild at 1 cm or less. The rotator cuff
is otherwise intact.

Muscles: Normal without atrophy or focal lesion.

Biceps long head: Intact.

Acromioclavicular Joint: Moderate degenerative change is seen.

Glenohumeral Joint: Unremarkable.

Labrum: Intact.

Bones: No fracture or worrisome lesion.
IMPRESSION: Motion degraded examination demonstrating a full-thickness tear of
the leading edge of the supraspinatus measuring approximately 1.2 cm
from front to back. Retraction is mild at 1 cm or less. No atrophy.

Intact long head of biceps and glenoid labrum.

Moderate acromioclavicular osteoarthritis.

## 2019-07-29 DIAGNOSIS — R1310 Dysphagia, unspecified: Principal | ICD-10-CM

## 2019-09-24 ENCOUNTER — Ambulatory Visit: Admit: 2019-09-24 | Discharge: 2019-09-25 | Payer: PRIVATE HEALTH INSURANCE

## 2019-09-24 DIAGNOSIS — R1310 Dysphagia, unspecified: Principal | ICD-10-CM

## 2019-12-09 DIAGNOSIS — Z1211 Encounter for screening for malignant neoplasm of colon: Principal | ICD-10-CM

## 2019-12-09 DIAGNOSIS — R1310 Dysphagia, unspecified: Principal | ICD-10-CM

## 2019-12-25 DIAGNOSIS — R0600 Dyspnea, unspecified: Principal | ICD-10-CM

## 2019-12-26 ENCOUNTER — Encounter: Payer: Self-pay | Admitting: General Practice

## 2020-01-15 ENCOUNTER — Ambulatory Visit: Admit: 2020-01-15 | Discharge: 2020-01-16 | Payer: PRIVATE HEALTH INSURANCE

## 2020-01-15 DIAGNOSIS — G4733 Obstructive sleep apnea (adult) (pediatric): Principal | ICD-10-CM

## 2020-01-15 DIAGNOSIS — R0600 Dyspnea, unspecified: Principal | ICD-10-CM

## 2020-01-15 DIAGNOSIS — Z8249 Family history of ischemic heart disease and other diseases of the circulatory system: Principal | ICD-10-CM

## 2020-01-15 DIAGNOSIS — I10 Essential (primary) hypertension: Principal | ICD-10-CM

## 2020-01-15 DIAGNOSIS — E785 Hyperlipidemia, unspecified: Principal | ICD-10-CM

## 2020-01-15 DIAGNOSIS — R9431 Abnormal electrocardiogram [ECG] [EKG]: Principal | ICD-10-CM

## 2020-01-15 DIAGNOSIS — F1010 Alcohol abuse, uncomplicated: Principal | ICD-10-CM

## 2020-01-15 DIAGNOSIS — Z720 Tobacco use: Principal | ICD-10-CM

## 2020-01-22 ENCOUNTER — Ambulatory Visit: Admit: 2020-01-22 | Discharge: 2020-01-23 | Payer: PRIVATE HEALTH INSURANCE

## 2020-01-27 ENCOUNTER — Encounter: Admit: 2020-01-27 | Discharge: 2020-01-28 | Payer: PRIVATE HEALTH INSURANCE

## 2020-02-19 MED ORDER — PEG 3350-ELECTROLYTES 236 GRAM-22.74 GRAM-6.74 GRAM-5.86 GRAM SOLUTION
Freq: Once | ORAL | 0 refills | 1 days | Status: CP
Start: 2020-02-19 — End: 2020-02-20
  Filled 2020-02-19: qty 4000, 1d supply, fill #0

## 2020-02-19 MED FILL — PEG 3350-ELECTROLYTES 236 GRAM-22.74 GRAM-6.74 GRAM-5.86 GRAM SOLUTION: 1 days supply | Qty: 4000 | Fill #0 | Status: AC

## 2020-03-11 ENCOUNTER — Encounter: Admit: 2020-03-11 | Discharge: 2020-03-12 | Payer: PRIVATE HEALTH INSURANCE

## 2020-03-11 DIAGNOSIS — I25118 Atherosclerotic heart disease of native coronary artery with other forms of angina pectoris: Principal | ICD-10-CM

## 2020-03-11 DIAGNOSIS — E785 Hyperlipidemia, unspecified: Principal | ICD-10-CM

## 2020-03-11 DIAGNOSIS — F1010 Alcohol abuse, uncomplicated: Principal | ICD-10-CM

## 2020-03-11 DIAGNOSIS — G4733 Obstructive sleep apnea (adult) (pediatric): Principal | ICD-10-CM

## 2020-03-11 DIAGNOSIS — F17200 Nicotine dependence, unspecified, uncomplicated: Principal | ICD-10-CM

## 2020-03-11 DIAGNOSIS — I10 Essential (primary) hypertension: Principal | ICD-10-CM

## 2020-04-22 DIAGNOSIS — R319 Hematuria, unspecified: Principal | ICD-10-CM

## 2020-04-22 DIAGNOSIS — M545 Low back pain, unspecified back pain laterality, unspecified chronicity, unspecified whether sciatica present: Principal | ICD-10-CM

## 2020-07-07 ENCOUNTER — Encounter: Admit: 2020-07-07 | Discharge: 2020-07-08 | Payer: PRIVATE HEALTH INSURANCE

## 2020-07-07 DIAGNOSIS — R319 Hematuria, unspecified: Principal | ICD-10-CM

## 2020-07-07 DIAGNOSIS — R351 Nocturia: Principal | ICD-10-CM

## 2020-09-09 ENCOUNTER — Encounter: Admit: 2020-09-09 | Discharge: 2020-09-10 | Payer: PRIVATE HEALTH INSURANCE

## 2020-09-09 DIAGNOSIS — E785 Hyperlipidemia, unspecified: Principal | ICD-10-CM

## 2020-09-09 DIAGNOSIS — I10 Essential (primary) hypertension: Principal | ICD-10-CM

## 2020-09-09 DIAGNOSIS — I25118 Atherosclerotic heart disease of native coronary artery with other forms of angina pectoris: Principal | ICD-10-CM

## 2021-05-30 ENCOUNTER — Ambulatory Visit: Admit: 2021-05-30 | Discharge: 2021-05-31 | Payer: PRIVATE HEALTH INSURANCE

## 2021-05-30 DIAGNOSIS — R0600 Dyspnea, unspecified: Principal | ICD-10-CM

## 2021-06-30 ENCOUNTER — Emergency Department: Payer: PRIVATE HEALTH INSURANCE

## 2021-06-30 ENCOUNTER — Ambulatory Visit: Payer: PRIVATE HEALTH INSURANCE

## 2021-06-30 DIAGNOSIS — M25571 Pain in right ankle and joints of right foot: Principal | ICD-10-CM

## 2021-06-30 DIAGNOSIS — M109 Gout, unspecified: Principal | ICD-10-CM

## 2021-06-30 MED ORDER — METHYLPREDNISOLONE 4 MG TABLETS IN A DOSE PACK
0 refills | 0 days | Status: CP
Start: 2021-06-30 — End: ?

## 2021-09-07 ENCOUNTER — Ambulatory Visit: Admit: 2021-09-07 | Discharge: 2021-09-08 | Payer: PRIVATE HEALTH INSURANCE

## 2021-09-07 DIAGNOSIS — E785 Hyperlipidemia, unspecified: Principal | ICD-10-CM

## 2021-09-07 DIAGNOSIS — Z720 Tobacco use: Principal | ICD-10-CM

## 2021-09-07 DIAGNOSIS — I25118 Atherosclerotic heart disease of native coronary artery with other forms of angina pectoris: Principal | ICD-10-CM

## 2021-09-07 DIAGNOSIS — G4733 Obstructive sleep apnea (adult) (pediatric): Principal | ICD-10-CM

## 2021-09-07 DIAGNOSIS — F1010 Alcohol abuse, uncomplicated: Principal | ICD-10-CM

## 2021-10-16 ENCOUNTER — Ambulatory Visit
Admit: 2021-10-16 | Discharge: 2021-10-16 | Disposition: A | Discharge status: Home / Self Care | Payer: PRIVATE HEALTH INSURANCE | Attending: Emergency Medicine

## 2021-10-16 DIAGNOSIS — T7840XA Allergy, unspecified, initial encounter: Principal | ICD-10-CM

## 2021-10-16 MED ORDER — HYDROXYZINE HCL 25 MG TABLET
ORAL_TABLET | Freq: Three times a day (TID) | ORAL | 0 refills | 4 days | Status: CP | PRN
Start: 2021-10-16 — End: ?

## 2021-10-16 MED ORDER — PREDNISONE 20 MG TABLET
ORAL_TABLET | 0 refills | 0 days | Status: CP
Start: 2021-10-16 — End: ?

## 2021-10-16 MED ORDER — FAMOTIDINE 20 MG TABLET
ORAL_TABLET | Freq: Two times a day (BID) | ORAL | 0 refills | 8 days | Status: CP
Start: 2021-10-16 — End: 2021-10-24

## 2021-10-21 ENCOUNTER — Ambulatory Visit
Admit: 2021-10-21 | Discharge: 2021-10-21 | Disposition: A | Discharge status: Home / Self Care | Payer: PRIVATE HEALTH INSURANCE | Attending: Medical

## 2021-10-21 DIAGNOSIS — L509 Urticaria, unspecified: Principal | ICD-10-CM

## 2021-10-21 MED ORDER — FAMOTIDINE 20 MG TABLET
ORAL_TABLET | Freq: Two times a day (BID) | ORAL | 0 refills | 8 days | Status: CP
Start: 2021-10-21 — End: 2021-10-29

## 2021-10-21 MED ORDER — HYDROXYZINE HCL 25 MG TABLET
ORAL_TABLET | Freq: Three times a day (TID) | ORAL | 0 refills | 4 days | Status: CP | PRN
Start: 2021-10-21 — End: ?

## 2021-10-21 MED ORDER — PREDNISONE 20 MG TABLET
ORAL_TABLET | 0 refills | 0 days | Status: CP
Start: 2021-10-21 — End: ?

## 2021-11-08 DIAGNOSIS — Z91018 Allergy to other foods: Principal | ICD-10-CM

## 2021-11-08 DIAGNOSIS — L501 Idiopathic urticaria: Principal | ICD-10-CM

## 2021-11-15 ENCOUNTER — Ambulatory Visit: Admit: 2021-11-15 | Discharge: 2021-11-16 | Payer: PRIVATE HEALTH INSURANCE

## 2021-11-15 DIAGNOSIS — L508 Other urticaria: Principal | ICD-10-CM

## 2021-11-15 DIAGNOSIS — L501 Idiopathic urticaria: Principal | ICD-10-CM

## 2021-11-15 DIAGNOSIS — Z91018 Allergy to other foods: Principal | ICD-10-CM

## 2021-11-15 MED ORDER — CETIRIZINE 10 MG TABLET
ORAL_TABLET | Freq: Two times a day (BID) | ORAL | 11 refills | 30 days | Status: CP
Start: 2021-11-15 — End: 2022-11-15

## 2021-11-15 MED ORDER — HYDROXYZINE HCL 25 MG TABLET
ORAL_TABLET | Freq: Three times a day (TID) | ORAL | 3 refills | 30 days | Status: CP | PRN
Start: 2021-11-15 — End: ?

## 2022-01-13 ENCOUNTER — Ambulatory Visit: Admit: 2022-01-13 | Discharge: 2022-01-14 | Payer: PRIVATE HEALTH INSURANCE

## 2022-01-13 DIAGNOSIS — L501 Idiopathic urticaria: Principal | ICD-10-CM

## 2022-01-13 MED ORDER — HYDROXYCHLOROQUINE 200 MG TABLET
ORAL_TABLET | Freq: Two times a day (BID) | ORAL | 11 refills | 30 days | Status: CP
Start: 2022-01-13 — End: 2023-01-13

## 2022-01-13 MED ORDER — PREDNISONE 10 MG TABLET
ORAL_TABLET | 0 refills | 0 days | Status: CP
Start: 2022-01-13 — End: 2022-02-17

## 2022-01-13 MED ORDER — CLOTRIMAZOLE 1 % TOPICAL CREAM
Freq: Two times a day (BID) | TOPICAL | 4 refills | 0 days | Status: CP
Start: 2022-01-13 — End: 2023-01-13

## 2022-02-10 DIAGNOSIS — R319 Hematuria, unspecified: Principal | ICD-10-CM

## 2022-03-01 MED ORDER — PREDNISONE 10 MG TABLET
ORAL_TABLET | 0 refills | 12 days | Status: CP
Start: 2022-03-01 — End: 2022-03-13

## 2022-04-11 ENCOUNTER — Ambulatory Visit: Admit: 2022-04-11 | Discharge: 2022-04-12 | Payer: PRIVATE HEALTH INSURANCE

## 2022-04-11 DIAGNOSIS — L508 Other urticaria: Principal | ICD-10-CM

## 2022-04-11 MED ORDER — OMALIZUMAB 150 MG/ML SUBCUTANEOUS SYRINGE
SUBCUTANEOUS | 11 refills | 28 days | Status: CP
Start: 2022-04-11 — End: ?
  Filled 2022-04-20: qty 2, 28d supply, fill #0

## 2022-04-11 MED ORDER — EPINEPHRINE 0.3 MG/0.3 ML INJECTION, AUTO-INJECTOR
Freq: Once | INTRAMUSCULAR | 2 refills | 0 days | Status: CP | PRN
Start: 2022-04-11 — End: ?

## 2022-04-12 DIAGNOSIS — L508 Other urticaria: Principal | ICD-10-CM

## 2022-04-18 NOTE — Unmapped (Addendum)
This patient is receiving Xolair as a clinic administered medication. Pharmacist reviewed the prescription and the patient's chart and determined that therapy is appropriate.  Patient is: aware of copay $0 . All future communication to occur between the [facility] and the clinic. This patient is being disenrolled from our specialty management program and will be added to a patient list for appropriate follow up.      [facility]   Patient Onboarding/Medication Counseling    Matthew Deleon is a 61 y.o. male with chronic urticaria who I am counseling today on initiation of therapy.  I am speaking to the patient.    Was a Nurse, learning disability used for this call? No    Verified patient's date of birth / HIPAA.    Specialty medication(s) to be sent: CF/Pulmonary/Asthma: Xolair 150      Non-specialty medications/supplies to be sent: n/a      Medications not needed at this time: none         Xolair Syringes (omalizumab)    Medication & Administration     Dosage: Inject 300mg  under the skin every 28 days    Administration:     Vials: Must be administered as a subcutaneous injection in the abdomen or thighs by a healthcare professional. Patient will be observed after receiving first 3 injections in clinic before moving to home administration.    Prefilled Syringe:  Home administration screening questions:    Has patient ever had an anaphylaxis reaction to Xolair or other agents, such as foods, drugs, biologics, etc? No    Has patient received at least 3 doses in clinic under the supervision of a healthcare proefssional? No - medication will be administered in clinic by a healthcare professional    Does the patient have an Epi-pen to use for possible anaphylaxis reaction? Yes    Injection administration:   Gather all supplies needed for injection on a clean, flat working surface: medication syringe(s) removed from packaging, alcohol swab, sharps container, etc.  Look at the medication label - look for correct medication, correct dose, and check the expiration date  Look at the medication - the liquid in the syringe should appear clear and colorless to slightly yellow, you may see a few white particles  Lay the syringe on a flat surface and allow it to warm up to room temperature for at least 15-30 minutes  Select injection site - you can use the front of your thigh or your belly (but not the area 2 inches around your belly button); if someone else is giving you the injection you can also use your upper arm in the skin covering your triceps muscle or in the buttocks  Prepare injection site - wash your hands and clean the skin at the injection site with an alcohol swab and let it air dry, do not touch the injection site again before the injection  Pull off the needle safety cap, do not remove until immediately prior to injection  Pinch the skin - with your hand not holding the syringe pinch up a fold of skin at the injection site using your forefinger and thumb  Insert the needle into the fold of skin at about a 45 degree angle - it's best to use a quick dart-like motion  Push the plunger down slowly as far as it will go until the syringe is empty, if the plunger is not fully depressed the needle shield will not extend to cover the needle when it is removed, hold  the syringe in place for a full 5 seconds  Check that the syringe is empty and keep pressing down on the plunger while you pull the needle out at the same angle as inserted; after the needle is removed completely from the skin, release the plunger allowing the needle shield to activate and cover the used needle  Dispose of the used syringe immediately in your sharps disposal container, do not attempt to recap the needle prior to disposing  If you see any blood at the injection site, press a cotton ball or gauze on the site and maintain pressure until the bleeding stops, do not rub the injection site    Adherence/Missed dose instructions: If you miss a dose take as soon as you remember.  Resume the correct dosing schedule.        Goals of Therapy     To treat asthma and or chronic idiopathic urticaria    Side Effects & Monitoring Parameters     Commonly reported side effects  Headache  Nausea, vomiting   Injection site reaction  Loss of strength and energy  Common cold symptoms, sore throat, stuffy nose  Ear pain  Painful extremities     The following side effects should be reported to the provider:  Signs of cerebrovascular disease (change in strength on one side is greater than the other, trouble speaking or thinking, change in balance or vision changes)  Signs of DVT (swelling, warmth, numbness, change in color or pain in extremities)  Signs of anaphylaxis (wheezing, chest tightness, swelling of face, lips, tongue or throat)    Monitoring Parameters:   Anaphylactic/hypersensitivity reactions (observe patients for 2 hours after the first 3 injections and 30 minutes after subsequent injections or in accordance with individual institution policies and procedures);   Baseline serum total IgE; FEV1, peak flow, and/or other pulmonary function tests  Monitor for signs of infection    Contraindications, Warnings, & Precautions   [place] Boxed Warning]: Anaphylaxis, including delayed-onset anaphylaxis, has been reported following administration; anaphylaxis may present as bronchospasm, hypotension, syncope, urticaria, and/or angioedema of the throat or tongue. Anaphylaxis has occurred after the first dose and in some cases >1 year after initiation of regular treatment. Due to the risk, patients should be observed closely for an appropriate time period after administration and should receive treatment only under direct medical supervision. Healthcare providers should be prepared to administer appropriate therapy for managing potentially life-threatening anaphylaxis. Patients should be instructed on identifying signs/symptoms of anaphylaxis and to seek immediate care if they arise. Contraindications  Severe hypersensitivity reaction to omalizumab or any component of the formulation    Warnings & Precautions  Cardiovascular effects: Cerebrovascular events, including transient ischemic attack and ischemic stroke, have been reported.  Eosinophilia and vasculitis: In rare cases, patients may present with systemic eosinophilia, sometimes presenting with clinical features of vasculitis.    Fever/arthralgia/rash: Reports of a constellation of symptoms including fever, arthritis or arthralgia, rash, and lymphadenopathy have been reported with post-marketing use.  Malignant neoplasms: Have been reported rarely with use in short-term studies; impact of long-term use is not known.  Parasitic infections: Use with caution and monitor patients at high risk for parasitic infections; risk of infection may be increased; appropriate duration of continued monitoring following therapy discontinuation has not been established.  Corticosteroid therapy: Gradually taper systemic or inhaled corticosteroid therapy; do not discontinue corticosteroids abruptly following initiation of omalizumab therapy. The combined use of omalizumab and corticosteroids in patients with chronic idiopathic urticaria has not  been evaluated.  Latex: Prefilled syringe: The needle cap may contain natural rubber latex.  Appropriate use: Therapy has not been shown to alleviate acute asthma exacerbations; do not use to treat acute bronchospasm, status asthmaticus, or other allergic conditions. Do not use to treat forms of urticaria other than chronic idiopathic urticaria.  Dosing/IgE levels: Dosing for asthma is based on body weight and pretreatment total IgE serum levels. IgE levels remain elevated up to 1 year following treatment; therefore, levels taken during treatment or for up to 1 year following treatment cannot and should not be used as a dosage guide. Dosing in chronic idiopathic urticaria is not dependent on serum IgE (free or total) level or body weight.  Pregnancy Considerations: Omalizumab is a humanized monoclonal antibody (IgG1). Potential placental transfer of human IgG is dependent upon the IgG subclass and gestational age, generally increasing as pregnancy progresses.  Breastfeeding Considerations: It is not known if omalizumab is present in breast milk; however, IgG is excreted in human milk.  Based on information from the pregnancy exposure registry, an increased risk of adverse events was not observed in breastfed infants of mothers using omalizumab. According to the manufacturer, the decision to breastfeed during therapy should consider the risk of infant exposure, the benefits of breastfeeding to the infant, and benefits of treatment to the mother      Drug/Food Interactions     Medication list reviewed in Epic. The patient was instructed to inform the care team before taking any new medications or supplements. No drug interactions identified.     Storage, Handling Precautions, & Disposal     Store this medication in the refrigerator, 2??C to 8??C (36??F to 46??F). in the original carton.  Protect from direct sunlight and do not freeze. Must be used within 4 hours after removal from refrigerator.  Do not shake.  Dispose of used syringes in a sharps disposal container.      Current Medications (including OTC/herbals), Comorbidities and Allergies     Current Outpatient Medications   Medication Sig Dispense Refill    aspirin (ECOTRIN) 81 MG tablet Take 1 tablet (81 mg total) by mouth daily.      atorvastatin (LIPITOR) 80 MG tablet Take 1 tablet (80 mg total) by mouth daily.  1    buPROPion (WELLBUTRIN XL) 300 MG 24 hr tablet Take 1 tablet (300 mg total) by mouth daily.  1    cetirizine (ZYRTEC) 10 MG tablet Take 2 tablets (20 mg total) by mouth two (2) times a day. 120 tablet 11    clotrimazole (LOTRIMIN) 1 % cream Apply topically Two (2) times a day. 60 g 4    colchicine (COLCRYS) 0.6 mg tablet Take 1 tablet (0.6 mg total) by mouth once as needed.      disulfiram (ANTABUSE) 250 mg tablet Take 2 tablets (500 mg total) by mouth daily.      EPINEPHrine (EPIPEN) 0.3 mg/0.3 mL injection Inject 0.3 mL (0.3 mg total) into the muscle once as needed for anaphylaxis for up to 1 dose. 2 each 2    famotidine (PEPCID) 20 MG tablet Take 1 tablet (20 mg total) by mouth Two (2) times a day for 15 doses. 15 tablet 0    furosemide (LASIX) 20 MG tablet Take 1 tablet (20 mg total) by mouth.      hydroCHLOROthiazide (HYDRODIURIL) 25 MG tablet Take 1 tablet (25 mg total) by mouth Two (2) times a day.  1    HYDROcodone-acetaminophen (NORCO) 5-325 mg per  tablet Take 1 tablet by mouth every eight (8) hours as needed.      hydroxychloroquine (PLAQUENIL) 200 mg tablet Take 1 tablet (200 mg total) by mouth Two (2) times a day. 60 tablet 11    hydrOXYzine (ATARAX) 25 MG tablet Take 1 tablet (25 mg total) by mouth every eight (8) hours as needed for itching (hives). 90 tablet 3    LORazepam (ATIVAN) 2 MG tablet       metoprolol succinate (TOPROL-XL) 100 MG 24 hr tablet Take 1 tablet (100 mg total) by mouth daily.  1    olmesartan-hydrochlorothiazide (BENICAR HCT) 40-25 mg per tablet Take 1 tablet by mouth daily.      omalizumab (XOLAIR) 150 mg/mL syringe Inject the contents of 2 syringes (300 mg total) under the skin every twenty-eight (28) days. 2 mL 11    omeprazole (PRILOSEC) 20 MG capsule Take 1 capsule (20 mg total) by mouth daily.  3    tadalafiL (CIALIS) 5 MG tablet Take 1 tablet (5 mg total) by mouth daily as needed.       No current facility-administered medications for this visit.       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    Appropriateness of Therapy     Acute infections noted within Epic:  No active infections  Patient reported infection: None    Is medication and dose appropriate based on diagnosis and infection status? Yes    Prescription has been clinically reviewed: Yes      Baseline Quality of Life Assessment      How many days over the past month did your urticaria  keep you from your normal activities? For example, brushing your teeth or getting up in the morning. 0    Financial Information     Medication Assistance provided: Prior Authorization    Anticipated copay of $0 reviewed with patient. Verified delivery address.    Delivery Information     Scheduled delivery date: 04/20/22    Expected start date: pending appt    Medication will be delivered via [facility] [place] [facility] to the temporary address in [place].  This shipment will not require a signature.      Explained the services we provide at [facility] and that each month we would call to set up refills.  Stressed importance of returning phone calls so that we could ensure they receive their medications in time each month.  Informed patient that we should be setting up refills 7-10 days prior to when they will run out of medication.  A pharmacist will reach out to perform a clinical assessment periodically.  Informed patient that a welcome  packet, containing information about our pharmacy and other support services, a Notice of Privacy Practices, and a drug information handout will be sent.      The patient or caregiver noted above participated in the development of this care plan and knows that they can request review of or adjustments to the care plan at any time.      Patient or caregiver verbalized understanding of the above information as well as how to contact the pharmacy at [phone] option 4 with any questions/concerns.  The pharmacy is open Monday through Friday 8:30am-4:30pm.  A pharmacist is available 24/7 via pager to answer any clinical questions they may have.    Patient Specific Needs     Does the patient have any physical, cognitive, or cultural barriers? No    Does the patient have adequate living arrangements? (i.e. the ability to store and take their medication appropriately) Yes    Did you identify any home environmental safety or security hazards? No    Patient prefers to have medications discussed with  Patient     Is the patient or caregiver able to read and understand education materials at a high school level or above? Yes    Patient's primary language is  English     Is the patient high risk? No    SOCIAL DETERMINANTS OF HEALTH     At the [facility], we have learned that life circumstances - like trouble affording food, housing, utilities, or transportation can affect the health of many of our patients.   That is why we wanted to ask: are you currently experiencing any life circumstances that are negatively impacting your health and/or quality of life? Patient declined to answer    Social Determinants of Health     Financial Resource Strain: Not on file   Internet Connectivity: Not on file   Food Insecurity: Not on file   Tobacco Use: High Risk (04/11/2022)    Patient History     Smoking Tobacco Use: Every Day     Smokeless Tobacco Use: Never     Passive Exposure: Not on file   Housing/Utilities: Not on file   Alcohol Use: Not on file   Transportation Needs: Not on file   Substance Use: Not on file   Health Literacy: Low Risk  (09/13/2021)    Health Literacy     : Never   Physical Activity: Not on file   Interpersonal Safety: Not on file   Stress: Not on file   Intimate Partner Violence: Not on file   Depression: Not at risk (09/09/2020)    PHQ-2     PHQ-2 Score: 0   Social Connections: Not on file       Would you be willing to receive help with any of the needs that you have identified today? Not applicable       Arnold Long, PharmD  [facility] Specialty Pharmacist

## 2022-04-18 NOTE — Unmapped (Signed)
[facility] Specialty Medication Onboarding    Specialty Medication: XOLAIR 150 mg/mL syringe (omalizumab)  Prior Authorization: Approved   Financial Assistance: No - copay  <$25  Final Copay/Day Supply: $0 / 28 days    Insurance Restrictions: Yes - max 1 month supply     Notes to Pharmacist: n/a    The triage team has completed the benefits investigation and has determined that the patient is able to fill this medication at [facility]. Please contact the patient to complete the onboarding or follow up with the prescribing physician as needed.

## 2022-04-19 ENCOUNTER — Ambulatory Visit: Admit: 2022-04-19 | Discharge: 2022-04-20 | Payer: PRIVATE HEALTH INSURANCE | Attending: Urology | Primary: Urology

## 2022-04-19 DIAGNOSIS — R319 Hematuria, unspecified: Principal | ICD-10-CM

## 2022-04-19 DIAGNOSIS — N368 Other specified disorders of urethra: Principal | ICD-10-CM

## 2022-04-19 MED ADMIN — lidocaine 2% gel (XYLOCAINE) jelly urojet 20 mL: 20 mL | URETHRAL | @ 18:00:00 | Stop: 2022-04-19

## 2022-04-19 MED ADMIN — sodium chloride irrigation (NS) 0.9 % irrigation solution: @ 18:00:00 | Stop: 2022-04-19

## 2022-04-19 NOTE — Unmapped (Signed)
[facility]:  Taking Care of Yourself After Cystoscopy Procedures    *Drink plenty of water for a day or two following your procedure.  Try to have about 8 ounces (one cup) at a time, and do this 6 times or more per day.  (If you have fluid restrictions, please ask the nurse or doctor for advice).    *AVOID alcoholic, carbonated and caffeinated drinks for a day or two, as they may cause uncomfortable symptoms.    *For the first 8 hours after the procedure, your urine may be pink or red in color.  Small clots or a few drops of blood can be a normal side effect of the instruments.  Large amounts of bleeding or difficulty urinating are not normal.  Call your doctor if this happens.    *You may experience some mild discomfort of a burning sensation with urination after having this procedure.  If it does not improve, or if other symptoms appear (fever, chills, or difficulty emptying), call your doctor.    *You may return to normal daily activities such as work, school, driving, exercising and housework.    *If your doctor gave you a prescription, take it as ordered.    *If you need a return appointment, the secretary will make it for you when you check out. To contact the Urology Clinic during business hours, call [phone].    *[facility] Operator can be reached at [phone] if you need to get in contact with your doctor.  After the hours, the operator can page the doctor on call for urgent concerns:    Urology Patients should ask for the Urology resident “on call”.    You can get more immediate assistance at the Emergency Room or Urgent Care if necessary.

## 2022-04-19 NOTE — Unmapped (Signed)
Procedures    Indication: gross hematuria.      Cystoscopy (male)  Time out was performed immediately prior to the procedure.    The patient was prepped and draped in the usual sterile fashion.  Flexible cystosopy was performed.  The urethra was inspected. No strictures noted  Within the prostatic  urethra, pedunculated papillary mass noted.  Fronds visible on retroflexion.   The bladder urothelium was without abnormality.  The ureteral orifices were visualized bilaterally, and efflux was clear.    The patient tolerated the procedure well and was not given one dose of antibiotic prophylaxis afterwards per guidelines.    Specimen:      Plan:  Abnormal cystoscopy. Please see same day clinic note

## 2022-04-19 NOTE — Unmapped (Signed)
Assessment: 61 y.o. male with abnormal cystoscopy.  I showed the lesion to Mr. Ambrosino and recommended proceeding with transurethral resection.  He agrees.  We will send a cardiac risk evaluation note to his cardiologist.    Plan: Schedule TURBT with next available adult oncology  Given cardiac history, message sent to his cardiologist.     Urology Surgery Scheduling Checklist    Timing: Next available  Attending: Other: any adult oncology  Urology Pre-op:  No  Pre Anesthesia (Precare):  Yes - Scheduler place order when surgery confirmed  Urine:  Obtained previously/today - No repeat needed  Testing Plan:  No additional testing  Blood thinners:  ASA (81) - do not stop  Cardiac/Anticoagulation Clearance: Yes - Epic message to Dr. Rayetta Pigg Assar  Post Op Appointment(s): TBD        ______________________________________________________________________    CC: Gross hematuria & abnormal cysto      HPI: 61 y.o. male with history of recurrent gross hematuria.  Cystoscopy today demonstrated a pedunculated papillary lesion within the prostatic urethra.  After reviewing, we discussed proceeding with transurethral resection and he is in agreement.    Cardiac history: yes   - Cardiologist Dr. Rayetta Pigg Assar; last seen 09/07/2021    Blood thinner: no      Medical History  Past Medical History:   Diagnosis Date    Allergies     Arthritis     Asthma     GERD (gastroesophageal reflux disease)     Gout     Hyperlipidemia     Hypertension     Shortness of breath        Surgical History  Past Surgical History:   Procedure Laterality Date    KNEE SURGERY Bilateral 08/2011    right knee/meniscus    ROTATOR CUFF REPAIR  03/07/2018       Social History:  Patient  reports that he has been smoking cigarettes. He has never used smokeless tobacco. He reports current alcohol use of about 42.0 standard drinks of alcohol per week. He reports that he does not use drugs.     Family History:  The patient's family history includes Breast cancer in his mother; Coronary artery disease in his brother; Heart attack in his brother; Heart disease in his mother; Pulmonary fibrosis in his father.     Medications:  Current Outpatient Medications   Medication Sig Dispense Refill    aspirin (ECOTRIN) 81 MG tablet Take 1 tablet (81 mg total) by mouth daily.      atorvastatin (LIPITOR) 80 MG tablet Take 1 tablet (80 mg total) by mouth daily.  1    buPROPion (WELLBUTRIN XL) 300 MG 24 hr tablet Take 1 tablet (300 mg total) by mouth daily.  1    cetirizine (ZYRTEC) 10 MG tablet Take 2 tablets (20 mg total) by mouth two (2) times a day. 120 tablet 11    clotrimazole (LOTRIMIN) 1 % cream Apply topically Two (2) times a day. 60 g 4    colchicine (COLCRYS) 0.6 mg tablet Take 1 tablet (0.6 mg total) by mouth once as needed.      EPINEPHrine (EPIPEN) 0.3 mg/0.3 mL injection Inject 0.3 mL (0.3 mg total) into the muscle once as needed for anaphylaxis for up to 1 dose. 2 each 2    furosemide (LASIX) 20 MG tablet Take 1 tablet (20 mg total) by mouth.      hydroCHLOROthiazide (HYDRODIURIL) 25 MG tablet Take 1 tablet (25 mg total) by mouth  two (2) times a day.  1    HYDROcodone-acetaminophen (NORCO) 5-325 mg per tablet Take 1 tablet by mouth every eight (8) hours as needed.      hydroxychloroquine (PLAQUENIL) 200 mg tablet Take 1 tablet (200 mg total) by mouth Two (2) times a day. 60 tablet 11    hydrOXYzine (ATARAX) 25 MG tablet Take 1 tablet (25 mg total) by mouth every eight (8) hours as needed for itching (hives). 90 tablet 3    LORazepam (ATIVAN) 2 MG tablet       metoprolol succinate (TOPROL-XL) 100 MG 24 hr tablet Take 1 tablet (100 mg total) by mouth daily.  1    olmesartan-hydrochlorothiazide (BENICAR HCT) 40-25 mg per tablet Take 1 tablet by mouth daily.      omalizumab (XOLAIR) 150 mg/mL syringe Inject the contents of 2 syringes (300 mg total) under the skin every twenty-eight (28) days. 2 mL 11    omeprazole (PRILOSEC) 20 MG capsule Take 1 capsule (20 mg total) by mouth daily. 3    tadalafiL (CIALIS) 5 MG tablet Take 1 tablet (5 mg total) by mouth daily as needed.      disulfiram (ANTABUSE) 250 mg tablet Take 2 tablets (500 mg total) by mouth daily. (Patient not taking: Reported on 04/19/2022)      famotidine (PEPCID) 20 MG tablet Take 1 tablet (20 mg total) by mouth Two (2) times a day for 15 doses. 15 tablet 0     No current facility-administered medications for this visit.       Allergies:  Daypro [oxaprozin], Hydrocodone bitartrate, Levofloxacin, and Seroquel [quetiapine]     Physical Exam:  BP 102/64  - Pulse 76  - Temp 36.2 ??C (97.1 ??F) (Temporal)   General: well appearing male  Mental Status: Alert & oriented x 4  Resp: normal respiratory effort without use of accessory muscles  Skin: warm and dry      Labs:   Results for orders placed or performed in visit on 04/19/22   POCT Urinalysis Dipstick   Result Value Ref Range    Spec Gravity/POC 1.020 1.003 - 1.030    PH/POC 6.5 5.0 - 9.0    Leuk Esterase/POC Negative Negative    Nitrite/POC Negative Negative    Protein/POC Negative Negative    UA Glucose/POC Negative Negative    Ketones, POC Negative Negative    Bilirubin/POC Negative Negative    Blood/POC Negative Negative    Urobilinogen/POC 0.2 0.2 - 1.0 mg/dL

## 2022-04-20 ENCOUNTER — Ambulatory Visit: Admit: 2022-04-20 | Discharge: 2022-04-20 | Payer: PRIVATE HEALTH INSURANCE

## 2022-04-20 ENCOUNTER — Ambulatory Visit: Admit: 2022-04-20 | Discharge: 2022-04-20 | Payer: PRIVATE HEALTH INSURANCE | Attending: Urology | Primary: Urology

## 2022-04-20 MED ADMIN — iohexol (OMNIPAQUE) 350 mg iodine/mL solution 100 mL: 100 mL | INTRAVENOUS | @ 16:00:00 | Stop: 2022-04-20

## 2022-04-21 ENCOUNTER — Ambulatory Visit: Admit: 2022-04-21 | Discharge: 2022-04-22 | Payer: PRIVATE HEALTH INSURANCE

## 2022-04-21 DIAGNOSIS — L501 Idiopathic urticaria: Principal | ICD-10-CM

## 2022-04-21 MED ADMIN — omalizumab (XOLAIR) injection 300 mg: 300 mg | SUBCUTANEOUS | @ 15:00:00 | Stop: 2022-07-14

## 2022-04-21 NOTE — Unmapped (Signed)
Patient in for 1st Xolair injections today. Received Xolair 300 mg subcutaneously, tolerated injections well. No other complaints at this time. Patient stayed for 2 hrs of monitoring. Vitals WNL.  Vitals:    04/21/22 0944   BP: 117/91   BP Site: L Arm   BP Position: Sitting   Pulse: 69   SpO2: 92%      Specialty

## 2022-04-25 NOTE — Unmapped (Signed)
Copy of staff message re cardiac risk eval    Assar, Soheil, DO  Matthew Deleon, Matthew Stabler, MD  Hi, moderate risk, optimized on medical therapy.  Scarlette Ar,          Previous Messages       ----- Message -----  From: Kem Kays, MD  Sent: 04/19/2022   2:45 PM EST  To: Hubert Azure, DO  Subject: Pre operative cardiac risk assessment            Good afternoon Dr. Sharrell Ku:    I saw Matthew Deleon in the urology clinic today.  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  This will require general anesthesia.    Would you please commend on  his cardiac risk as well as if any additional work up is needed from a cardiac standpoint.    With thanks  [employer]

## 2022-05-02 NOTE — Unmapped (Signed)
[facility]    Initial History of present illness:   62 year old male presenting for a plaquenil screening. Patient has been on Plaquenil since July 2023. Denies ocular pain, use of gtts, flashes of light and new or increasing floaters. Patient wears OTC readers only, +2.00. He requested a refraction today, aware of $66.00 refraction fee.        has a past medical history of Allergies, Arthritis, Asthma, GERD (gastroesophageal reflux disease), Gout, Hyperlipidemia, Hypertension, and Shortness of breath.       Assessment and plan:       # intermediate AMD both eyes   reticular pseudodrusen  No signs of CNV clinically or on OCT. Discussed use of AREDS 2 vitamins, healthy lifestyle, not smoking, Amsler grid.  05/03/2022 First visit KLW    First visit today KLW.  Discussed recommendation for antioxidant vitamins, avoidance of smoking.    Follow up 1 year, OCT, FAF     # Plaquenil use   Plaquenil use since 12/2021  Stable dose of 200 mg BID.   Recommend baseline exam and then annual testing after 5 years of use, or sooner if risk factors are present including: Duration of use > 5 years, Cumulative dose > 1000 g, Daily dose > 400 mg/day or >5 mg/kg or short stature, elderly, kidney or liver dysfunction, other retinal disease or maculopathy or tamoxifen use    No current signs of toxicity. Ok to continue using plaquenil.     # refractive error  both eyes  refraction dispensed 05/03/2022       Copy to :   Referring provider   Primary Care Provider Yisroel Ramming, PA    INTERPRETATION EXTENDED OPHTHALMOSCOPY MACULA (90Dlens)  Macula - right:  Vitreous syneresis, Normal, no mottling  Macula - left:  Vitreous syneresis, Normal, no mottling         05/03/2022   I saw and evaluated the patient, participating in the key portions of the service.  I reviewed the resident???s note.  I agree with the resident???s findings and plan including interpretation of images.

## 2022-05-03 ENCOUNTER — Ambulatory Visit: Admit: 2022-05-03 | Discharge: 2022-05-04 | Payer: PRIVATE HEALTH INSURANCE

## 2022-05-03 DIAGNOSIS — Z79899 Other long term (current) drug therapy: Principal | ICD-10-CM

## 2022-05-03 DIAGNOSIS — L508 Other urticaria: Principal | ICD-10-CM

## 2022-05-03 NOTE — Unmapped (Signed)
Age related macular degeneration        Age-Related Macular Degeneration  (AMD) is a deterioration of the retina and choroid that leads to a substantial loss in visual acuity (sharpness of vision). AMD is the leading cause of significant visual acuity loss in people over age 61 in developed countries.           Enlarge   Figure 1. Dry AMD. A. Drusen (indicated by arrow). B. Geographic atrophy Photo courtesy Earnie Larsson, MD     Enlarge   Figure 2. Wet AMD. 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 Photo courtesy Earnie Larsson, MD       Symptoms    In early stages, AMD may have no symptoms at all. When the disease progresses, the symptoms are:  Distortion (warping) of straight lines  A decrease in the intensity or brightness of colors  As the macular degeneration progresses, AMD symptoms include:  A gradual or sudden loss of central vision, or  Dark, blurry areas in the center of vision      Causes  Causes: The exact cause of AMD is unknown, but the condition develops as the eye ages. There are 2 types of AMD: non-neovascular or dry AMD; and neovascular or wet AMD.  In early stages of dry AMD, the hallmark is drusen--pale yellow lesions formed beneath the retina (Figure 1A). Drusen are usually harmless, but as they accumulate, dry AMD can progress. Atrophic areas (areas of atrophy or wasting) in the retina also may develop; if the atrophic area is significant and with sharp borders, it is termed geographic atrophy (GA) (Figure 1B).  GA is the advanced form of dry AMD, which may be associated with loss of central vision.  In wet AMD, there is a sudden or gradual decrease in visual acuity, blind spots in the center of vision, and distortion of straight lines. The hallmark of wet AMD is choroidal neovascularization (CNV) (Figure 2).  CNV occurs when abnormal blood vessels grow beneath the retina; these can bleed or leak and cause a distortion of the retina???s structure. Ultimately, the CNV can turn into a disciform scar that replaces the normal architecture of the outer retina and leads to permanent loss of central vision.    RISK FACTORS  Many people ask if age-related macular degeneration can be prevented. Like most things in life, there is no easy answer.  The primary risk factor for AMD is age--the older you are, the greater your risk.  Also, people with a family history of AMD are at higher risk, as are women and people of European descent.  Some lifestyle factors are also known to increase your risk for AMD:  Cigarette smoking  Obesity  Hypertension (high blood pressure)  Excessive sun exposure  Diet deficient in fruits and vegetables    Diagnostic testing  Disease features related to AMD may be found in the retina and in the layers beneath it. According to these abnormal findings, AMD is classified as dry or wet.  An AMD diagnosis is made by a clinical examination with a slit lamp and by using several types of imaging, including:  Fluorescein angiography (FA)  Indocyanine green angiography (ICGA)  Optical coherence tomography (OCT)      Treatment and prognosis  Wet-AMD treatment has been revolutionized in recent years after the discovery of vascular endothelial growth factor (VEGF), a family of compounds in the body. VEGF regulates the growth of abnormal new blood vessels in the eye--known as neovascularization--that can lead to wet  AMD.  Anti-VEGF drugs have been developed to help stop neovascularization and preserve vision for AMD patients. There are currently 4 anti-VEGF drugs:  Avastin?? (bevacizumab)  Lucentis?? (ranibizumab)  Eylea?? (aflibercept)  Beovu?? (brolucizumab)    Wet AMD cannot be cured, but its progression may be blocked with the use of intravitreal (in-the-eye) anti-VEGF injections. These injections may preserve, and even recover, vision. Local anesthetic eye drops are given before the injections to numb the eye and minimize discomfort.  There are 3 anti-VEGF treatment regimens:  Pro re nata (PRN) or ???treat and observe???--patients are treated with three initial monthly injections, followed by treatment as needed.  ???Treat and extend???--after 3 initial monthly injections, the time between treatments is gradually increased until wet AMD is stabilized.  Monthly injections.  Before the first anti-VEGF drugs were introduced, wet-AMD patients were treated with laser photocoagulation or photodynamic therapy (PDT).  Anti-VEGF drugs have greatly improved wet-AMD treatment since 2005; patients today have a much better chance of maintaining their central vision so they can read, drive, recognize faces, and live normal lives.  No current treatment can prevent visual loss for patients with GA (the advanced form of dry AMD). However, the Age-Related Eye Disease Studies (AREDS), conducted by the [facility], have found that a nutritional supplement formula may delay and prevent intermediate dry AMD from moving to the advanced form.  The AREDS supplement formula, which is widely available over the counter, contains:  Vitamin C  Lutein  Vitamin E  Zeaxanthin  Zinc  Although patients with either form of AMD can experience a severe decrease in visual acuity, they will almost never be completely blind.                Amsler Grid     How To Test Your Eyes With This Amsler Grid  Print this page on bright white paper (heavy stock if possible).    Test your eyes under normal room lighting used for reading.    Wear the eyeglasses you normally wear for reading.    Hold the grid approximately 14 to 16 inches from your eyes.    Test each eye separately: Cup your hand over one eye while testing the other eye.    Keep your eye focused on the dot in the center of the grid and answer these questions:     1. Do any of the lines in the grid appear wavy, blurred or distorted?   2. Do all the boxes in the grid look square and the same size?   3. Are there any ???holes??? (missing areas) or dark areas in the grid?   4. Can you see all corners and sides of the grid (while keeping your eye on the central dot)?      Switch to the other eye and repeat. IMPORTANT - Report any irregularities to your eye doctor immediately: Loraine Leriche areas of the chart you???re not seeing properly (print two charts if you notice problems in each eye) and bring it with you when you visit your doctor.

## 2022-05-10 NOTE — Unmapped (Signed)
[facility] Administered Medication Refill Coordination Note      NAME:Taro Wanya Belmont DOB: 05/25/1960      Medication: Xolair    Day Supply: 28 days      SHIPPING      Next delivery from [facility] [phone]) to [facility] at [facility] [address] for Armany Torrez is scheduled for .01/24    Clinic contact: Teodoro Kil    Patient's next nurse visit for administration: 01/26.    We will follow up with clinic monthly for standard refill processing and delivery.      Karyl Sharrar Samella Parr  Specialty Pharmacy Technician

## 2022-05-16 DIAGNOSIS — L508 Other urticaria: Principal | ICD-10-CM

## 2022-05-17 MED FILL — XOLAIR 150 MG/ML SUBCUTANEOUS SYRINGE: SUBCUTANEOUS | 28 days supply | Qty: 2 | Fill #1

## 2022-05-19 ENCOUNTER — Ambulatory Visit: Admit: 2022-05-19 | Discharge: 2022-05-20 | Payer: PRIVATE HEALTH INSURANCE

## 2022-05-19 DIAGNOSIS — L501 Idiopathic urticaria: Principal | ICD-10-CM

## 2022-05-19 DIAGNOSIS — N368 Other specified disorders of urethra: Principal | ICD-10-CM

## 2022-05-19 MED ADMIN — omalizumab (XOLAIR) injection 300 mg: 300 mg | SUBCUTANEOUS | @ 15:00:00 | Stop: 2022-07-14

## 2022-05-19 NOTE — Unmapped (Signed)
Reached patient. Patient requested a call back on Wednesday 1/31.

## 2022-05-19 NOTE — Unmapped (Signed)
Patient in for 2nd Xolair injections today. Received Xolair 300 mg subcutaneously, tolerated injections well. Start within 2 days of getting shot, all symptoms of hives itching and blisters were gone and was symptom free for 2 weeks. After the 2 weeks, all symptoms came back and said that he has been miserable. Would like to do his Xolair every 3 weeks. Author told patient that he will tell provider what patient told him. No other complaints at this time.

## 2022-05-19 NOTE — Unmapped (Signed)
05/19/22:Called [phone] spoke w/ Pt but was driving and asked if I could send surgery information via MyChart; sent surgery information via MyChart and provided my telephone number as well.   MyChart msg informed of surgery date 06/21/22 an to NOT STOP taking Blood Thinners. MyChart msg also informed that the surgery dept will call one business day prior to the surgery date with the location and the start time.     Attending: Other: any adult oncology  Urology Pre-op: No  Pre Anesthesia (Precare): Yes - Scheduler place order when surgery confirmedDONE  Urine: Obtained previously/today - No repeat needed  Testing Plan: No additional testing  Blood thinners: ASA (81) - do not stop  Cardiac/Anticoagulation Clearance: Yes - Epic message to Dr. Rayetta Pigg Assar--CLEARED ON 04/25/22-DOCUMENTED IN CHART  Post Op Appointment(s): TBD

## 2022-05-24 NOTE — Unmapped (Signed)
Reached patient. Patient explained that he was going to reach out to Dr. Jeneen Montgomery scheduler to reschedule his surgery to a later date. Transferred patient to [facility] # [phone]. Epic secure chat sent to Dr. Raoul Pitch and [facility].

## 2022-05-26 NOTE — Unmapped (Signed)
05/26/22-Received call from pt to cancel case for now and will call back to resch and reopen case when best for him; informed Dr. Raoul Pitch

## 2022-06-06 ENCOUNTER — Ambulatory Visit
Admit: 2022-06-06 | Discharge: 2022-06-06 | Disposition: A | Discharge status: Home / Self Care | Payer: PRIVATE HEALTH INSURANCE

## 2022-06-06 DIAGNOSIS — K529 Noninfective gastroenteritis and colitis, unspecified: Principal | ICD-10-CM

## 2022-06-06 DIAGNOSIS — L509 Urticaria, unspecified: Principal | ICD-10-CM

## 2022-06-06 DIAGNOSIS — E860 Dehydration: Principal | ICD-10-CM

## 2022-06-06 LAB — CBC W/ AUTO DIFF
BASOPHILS ABSOLUTE COUNT: 0 10*9/L (ref 0.0–0.2)
BASOPHILS RELATIVE PERCENT: 0.3 %
EOSINOPHILS ABSOLUTE COUNT: 0.3 10*9/L (ref 0.0–0.4)
EOSINOPHILS RELATIVE PERCENT: 4.9 %
HEMATOCRIT: 39.5 % (ref 36.0–50.0)
HEMOGLOBIN: 13.8 g/dL (ref 12.5–17.0)
LYMPHOCYTES ABSOLUTE COUNT: 1.1 10*9/L (ref 0.7–4.5)
LYMPHOCYTES RELATIVE PERCENT: 18 %
MEAN CORPUSCULAR HEMOGLOBIN CONC: 34.9 g/dL (ref 32.0–36.0)
MEAN CORPUSCULAR HEMOGLOBIN: 32 pg (ref 27.0–34.0)
MEAN CORPUSCULAR VOLUME: 91.6 fL (ref 80.0–98.0)
MEAN PLATELET VOLUME: 10.1 fL (ref 7.4–10.4)
MONOCYTES ABSOLUTE COUNT: 0.6 10*9/L (ref 0.1–1.0)
MONOCYTES RELATIVE PERCENT: 10.5 %
NEUTROPHILS ABSOLUTE COUNT: 4 10*9/L (ref 1.8–7.8)
NEUTROPHILS RELATIVE PERCENT: 65.6 %
PLATELET COUNT: 119 10*9/L — ABNORMAL LOW (ref 140–415)
RED BLOOD CELL COUNT: 4.31 10*12/L (ref 4.10–5.60)
RED CELL DISTRIBUTION WIDTH: 12.1 % (ref 11.5–14.5)
WBC ADJUSTED: 6.1 10*9/L (ref 4.0–10.5)

## 2022-06-06 LAB — MAGNESIUM: MAGNESIUM: 1.3 mg/dL — ABNORMAL LOW (ref 1.6–2.6)

## 2022-06-06 LAB — BASIC METABOLIC PANEL
ANION GAP: 8 mmol/L (ref 3–11)
BLOOD UREA NITROGEN: 27 mg/dL — ABNORMAL HIGH (ref 8–20)
BUN / CREAT RATIO: 11
CALCIUM: 8.4 mg/dL — ABNORMAL LOW (ref 8.5–10.1)
CHLORIDE: 98 mmol/L (ref 98–107)
CO2: 27.5 mmol/L (ref 21.0–32.0)
CREATININE: 2.37 mg/dL — ABNORMAL HIGH (ref 0.80–1.30)
EGFR CKD-EPI (2021) MALE: 31 mL/min/{1.73_m2} — ABNORMAL LOW (ref >=60)
GLUCOSE RANDOM: 101 mg/dL (ref 70–179)
POTASSIUM: 3.3 mmol/L — ABNORMAL LOW (ref 3.5–5.0)
SODIUM: 133 mmol/L — ABNORMAL LOW (ref 135–145)

## 2022-06-06 MED ORDER — FAMOTIDINE 20 MG TABLET
ORAL_TABLET | Freq: Two times a day (BID) | ORAL | 0 refills | 5 days | Status: CP
Start: 2022-06-06 — End: 2022-06-11

## 2022-06-06 MED ORDER — ONDANSETRON 4 MG DISINTEGRATING TABLET
ORAL_TABLET | Freq: Three times a day (TID) | ORAL | 0 refills | 5 days | Status: CP | PRN
Start: 2022-06-06 — End: 2022-06-13

## 2022-06-06 MED ORDER — PREDNISONE 20 MG TABLET
ORAL_TABLET | Freq: Every day | ORAL | 0 refills | 5 days | Status: CP
Start: 2022-06-06 — End: 2022-06-11

## 2022-06-06 MED ADMIN — methylPREDNISolone sodium succinate (PF) (SOLU-Medrol) injection 125 mg: 125 mg | INTRAVENOUS | @ 15:00:00 | Stop: 2022-06-06

## 2022-06-06 MED ADMIN — diphenhydrAMINE (BENADRYL) injection: 25 mg | INTRAVENOUS | @ 15:00:00 | Stop: 2022-06-06

## 2022-06-06 MED ADMIN — famotidine (PF) (PEPCID) injection 20 mg: 20 mg | INTRAVENOUS | @ 15:00:00 | Stop: 2022-06-06

## 2022-06-06 MED ADMIN — ondansetron (ZOFRAN) injection 4 mg: 4 mg | INTRAVENOUS | @ 15:00:00 | Stop: 2022-06-06

## 2022-06-06 MED ADMIN — sodium chloride 0.9% (NS) bolus 1,000 mL: 1000 mL | INTRAVENOUS | @ 15:00:00 | Stop: 2022-06-06

## 2022-06-06 NOTE — Unmapped (Addendum)
Patient states on Thursday night he ate at a restaurant that he thinks upset his stomach. Patient states since eating at the restaurant he has had diarrhea but felt fine. Patient states on Sunday he started feeling fatigued, weak, and dehydration. Patient states he has been drinking water and Pedialyte and was able to eat yesterday.  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 Patient states he takes monthly injections for this but has not had them this month yet. Patient states the monthly injections or prednisone is the only thing that takes them away.

## 2022-06-06 NOTE — Unmapped (Signed)
Emergency Department Provider Note        ED Clinical Impression     Final diagnoses:   Gastroenteritis (Primary)   Dehydration   Urticaria       ED Assessment/Plan       Condition: Stable  Disposition: Discharge    This chart has been completed using Dragon Medical Dictation software, and while attempts have been made to ensure accuracy, certain words and phrases may not be transcribed as intended.     History     Chief Complaint   Patient presents with    Urticaria    Diarrhea    Nausea     HPI    Matthew Deleon is a 62 y.o. male  who presents today to the  emergency department complaining of diarrhea ever since Thursday after he ate at the restaurant.  Patient states that several episodes of diarrhea ever since then.  Ever since Sunday, he started feeling fatigued and tired.  He also had some nausea.  He thinks he may be dehydrated.  He denies any abdominal pain.  Patient also notes that he has a history of chronic urticaria and every now and then he gets a flareup.  Has now had some itching in his legs and his whole body from the urticaria.  He describes his symptoms as moderate.  He has no cardiopulmonary symptoms.        Allergies: is allergic to daypro [oxaprozin], hydrocodone bitartrate, levofloxacin, and seroquel [quetiapine].  Medications: has a current medication list which includes the following long-term medication(s): aspirin, atorvastatin, epinephrine, famotidine, famotidine, furosemide, hydrochlorothiazide, metoprolol succinate, and olmesartan-hydrochlorothiazide.  PMHx:  has a past medical history of Allergies, Arthritis, Asthma, GERD (gastroesophageal reflux disease), Gout, Hyperlipidemia, Hypertension, and Shortness of breath.  PSHx:  has a past surgical history that includes Knee surgery (Bilateral, 08/2011) and Rotator cuff repair (03/07/2018).  SocHx:  reports that he has been smoking cigarettes. He has never used smokeless tobacco. He reports current alcohol use of about 42.0 standard drinks of alcohol per week. He reports that he does not use drugs.  Allergies, Medications, Medical, Surgical, and Social History were reviewed as documented above.      Social Determinants of Health with Concerns     Financial Resource Strain: Not on file   Internet Connectivity: Not on file   Food Insecurity: Not on file   Tobacco Use: High Risk (05/03/2022)    Patient History     Smoking Tobacco Use: Every Day     Smokeless Tobacco Use: Never     Passive Exposure: Not on file   Housing/Utilities: Not on file   Alcohol Use: Not on file   Transportation Needs: Not on file   Substance Use: Not on file   Physical Activity: Not on file   Interpersonal Safety: Not on file   Stress: Not on file   Intimate Partner Violence: Not on file   Social Connections: Not on file         Review Of Systems    Review of Systems   Constitutional:  Positive for fatigue. Negative for fever.   HENT:  Negative for congestion.    Respiratory:  Negative for chest tightness and shortness of breath.    Cardiovascular:  Negative for chest pain.   Gastrointestinal:  Positive for diarrhea and nausea. Negative for abdominal pain.   Skin:  Positive for rash. Negative for color change.   Neurological:  Positive for weakness.   Psychiatric/Behavioral:  Negative for  behavioral problems.    All other systems reviewed and are negative.      Physical Exam     BP 102/61  - Pulse 63  - Temp 36.8 ??C (98.3 ??F) (Oral)  - Resp 18  - Ht 177.8 cm (5' 10)  - Wt (!) 127.9 kg (282 lb)  - SpO2 93%  - BMI 40.46 kg/m??     Physical Exam  Vitals and nursing note reviewed.   Constitutional:       General: He is not in acute distress.  HENT:      Head: Normocephalic.   Eyes:      Conjunctiva/sclera: Conjunctivae normal.   Cardiovascular:      Rate and Rhythm: Regular rhythm.      Pulses: Normal pulses.      Heart sounds: Normal heart sounds.   Pulmonary:      Effort: No respiratory distress.      Breath sounds: Normal breath sounds.   Abdominal:      General: There is no distension.      Tenderness: There is no abdominal tenderness. There is no guarding or rebound.      Comments: Abdomen is soft, nontender, normal active bowel sounds.   Musculoskeletal:         General: No deformity.   Skin:     General: Skin is warm.      Capillary Refill: Capillary refill takes 2 to 3 seconds.      Findings: Rash present.      Comments: Normal cap refill.  Patient has a diffuse urticarial rash.  No petechia or purpura.   Neurological:      General: No focal deficit present.      Mental Status: He is oriented to person, place, and time.      Comments: There is no motor, sensory or cerebellar deficits.  Nonfocal neurologic exam.  GCS is 15.  Cranial nerves grossly intact.   Psychiatric:         Mood and Affect: Mood normal.         ED Course   Medical Decision Making  Clinical picture suggests dehydration secondary to gastroenteritis/food poisoning.  Will check basic labs.  Will hydrate patient.  2.  Will treat his urticaria symptomatically.    10:27 AM  Patient reevaluated.  Patient is doing well.  Feels better.    11:06 AM  Patient is doing well.  He feels much better.  Constipation drinking fluids to stay hydrated.  Counseled to follow-up with PCP for further evaluation and recheck of his electrolytes and kidney function.  He is stable for discharge.    I have reviewed my clinical findings and studies and my clinical impression with the patient. The patient has expressed understanding that at this time there is no evidence for a more malignant underlying process, but the patient also understands that early in the process of a condition such as this, an initial workup can be falsely reassuring. I have counseled the patient and discussed follow-up with the patient, stressing the importance of appropriate follow-up. I have also counseled the patient to return if worse or any concerns. Routine discharge counseling was given to the patient and the patient understands that worsening, changing or persistent symptoms should prompt an immediate call or follow up with their primary physician or return to the emergency department for reevaluation. Patient has expressed understanding.          Problems Addressed:  Dehydration: acute illness or injury  that poses a threat to life or bodily functions  Gastroenteritis: acute illness or injury that poses a threat to life or bodily functions  Urticaria: acute illness or injury that poses a threat to life or bodily functions    Amount and/or Complexity of Data Reviewed  Labs: ordered. Decision-making details documented in ED Course.    Risk  Prescription drug management.  Decision regarding hospitalization.         Procedures     No results found for this visit on 06/06/22 (from the past 4464 hour(s)).      ED Results  Results for orders placed or performed during the hospital encounter of 06/06/22   Basic Metabolic Panel   Result Value Ref Range    Sodium 133 (L) 135 - 145 mmol/L    Potassium 3.3 (L) 3.5 - 5.0 mmol/L    Chloride 98 98 - 107 mmol/L    CO2 27.5 21.0 - 32.0 mmol/L    Anion Gap 8 3 - 11 mmol/L    BUN 27 (H) 8 - 20 mg/dL    Creatinine 1.61 (H) 0.80 - 1.30 mg/dL    BUN/Creatinine Ratio 11     eGFR CKD-EPI (2021) Male 31 (L) >=60 mL/min/1.53m2    Glucose 101 70 - 179 mg/dL    Calcium 8.4 (L) 8.5 - 10.1 mg/dL   Magnesium   Result Value Ref Range    Magnesium 1.3 (L) 1.6 - 2.6 mg/dL   CBC w/ Differential   Result Value Ref Range    WBC 6.1 4.0 - 10.5 10*9/L    RBC 4.31 4.10 - 5.60 10*12/L    HGB 13.8 12.5 - 17.0 g/dL    HCT 09.6 04.5 - 40.9 %    MCV 91.6 80.0 - 98.0 fL    MCH 32.0 27.0 - 34.0 pg    MCHC 34.9 32.0 - 36.0 g/dL    RDW 81.1 91.4 - 78.2 %    MPV 10.1 7.4 - 10.4 fL    Platelet 119 (L) 140 - 415 10*9/L    Neutrophils % 65.6 %    Lymphocytes % 18.0 %    Monocytes % 10.5 %    Eosinophils % 4.9 %    Basophils % 0.3 %    Absolute Neutrophils 4.0 1.8 - 7.8 10*9/L    Absolute Lymphocytes 1.1 0.7 - 4.5 10*9/L    Absolute Monocytes 0.6 0.1 - 1.0 10*9/L Absolute Eosinophils 0.3 0.0 - 0.4 10*9/L    Absolute Basophils 0.0 0.0 - 0.2 10*9/L     No results found.    Medications Administered:   Medications   sodium chloride 0.9% (NS) bolus 1,000 mL (0 mL Intravenous Stopped 06/06/22 1054)   ondansetron (ZOFRAN) injection 4 mg (4 mg Intravenous Given 06/06/22 0959)   diphenhydrAMINE (BENADRYL) injection (25 mg Intravenous Given 06/06/22 0959)   famotidine (PF) (PEPCID) injection 20 mg (20 mg Intravenous Given 06/06/22 1000)   methylPREDNISolone sodium succinate (PF) (SOLU-Medrol) injection 125 mg (125 mg Intravenous Given 06/06/22 1000)       Discharge Medications (Medications Prescribed during this  ED visit and Patient's Home Medications) :      Your Medication List        START taking these medications      ondansetron 4 MG disintegrating tablet  Commonly known as: ZOFRAN-ODT  Take 1 tablet (4 mg total) by mouth every eight (8) hours as needed for nausea for up to 7 days.     predniSONE 20  MG tablet  Commonly known as: DELTASONE  Take 3 tablets (60 mg total) by mouth daily for 5 days.            CHANGE how you take these medications      famotidine 20 MG tablet  Commonly known as: PEPCID  Take 1 tablet (20 mg total) by mouth Two (2) times a day for 15 doses.  What changed: Another medication with the same name was added. Make sure you understand how and when to take each.     famotidine 20 MG tablet  Commonly known as: PEPCID  Take 1 tablet (20 mg total) by mouth two (2) times a day for 5 days.  What changed: You were already taking a medication with the same name, and this prescription was added. Make sure you understand how and when to take each.            ASK your doctor about these medications      aspirin 81 MG tablet  Commonly known as: ECOTRIN  Take 1 tablet (81 mg total) by mouth daily.     atorvastatin 80 MG tablet  Commonly known as: LIPITOR  Take 1 tablet (80 mg total) by mouth daily.     buPROPion 300 MG 24 hr tablet  Commonly known as: Wellbutrin XL  Take 1 tablet (300 mg total) by mouth daily.     cetirizine 10 MG tablet  Commonly known as: ZYRTEC  Take 2 tablets (20 mg total) by mouth two (2) times a day.     clotrimazole 1 % cream  Commonly known as: LOTRIMIN  Apply topically Two (2) times a day.     colchicine 0.6 mg tablet  Commonly known as: COLCRYS  Take 1 tablet (0.6 mg total) by mouth once as needed.     disulfiram 250 mg tablet  Commonly known as: ANTABUSE  Take 2 tablets (500 mg total) by mouth daily.     EPINEPHrine 0.3 mg/0.3 mL injection  Commonly known as: EPIPEN  Inject 0.3 mL (0.3 mg total) into the muscle once as needed for anaphylaxis for up to 1 dose.     furosemide 20 MG tablet  Commonly known as: LASIX  Take 1 tablet (20 mg total) by mouth.     hydroCHLOROthiazide 25 MG tablet  Commonly known as: HYDRODIURIL  Take 1 tablet (25 mg total) by mouth two (2) times a day.     HYDROcodone-acetaminophen 5-325 mg per tablet  Commonly known as: NORCO  Take 1 tablet by mouth every eight (8) hours as needed.     hydroxychloroquine 200 mg tablet  Commonly known as: PLAQUENIL  Take 1 tablet (200 mg total) by mouth Two (2) times a day.     hydrOXYzine 25 MG tablet  Commonly known as: ATARAX  Take 1 tablet (25 mg total) by mouth every eight (8) hours as needed for itching (hives).     LORazepam 2 MG tablet  Commonly known as: ATIVAN     metoPROLOL succinate 100 MG 24 hr tablet  Commonly known as: Toprol-XL  Take 1 tablet (100 mg total) by mouth daily.     olmesartan-hydroCHLOROthiazide 40-25 mg per tablet  Commonly known as: BENICAR HCT  Take 1 tablet by mouth daily.     omeprazole 20 MG capsule  Commonly known as: PriLOSEC  Take 1 capsule (20 mg total) by mouth daily.     tadalafil 5 MG tablet  Commonly known as: CIALIS  Take 1  tablet (5 mg total) by mouth daily as needed.     XOLAIR 150 mg/mL syringe  Generic drug: omalizumab  Inject the contents of 2 syringes (300 mg total) under the skin every twenty-eight (28) days.                 Bernerd Pho, MD  06/06/22 [phone]

## 2022-06-06 NOTE — Unmapped (Signed)
[facility] Administered Medication Refill Coordination Note      NAME:Matthew Deleon DOB: [birth year]      Medication: Xolair    Day Supply: 28 days      SHIPPING      Next delivery from [facility] [phone]) to [facility]   for Matthew Deleon is scheduled for 02/21.    Clinic contact: Teodoro Kil    Patient's next nurse visit for administration: 02/23.    We will follow up with clinic monthly for standard refill processing and delivery.      Rolly Magri Samella Parr  Specialty Pharmacy Technician

## 2022-06-13 MED FILL — XOLAIR 150 MG/ML SUBCUTANEOUS SYRINGE: SUBCUTANEOUS | 28 days supply | Qty: 2 | Fill #2

## 2022-06-16 ENCOUNTER — Ambulatory Visit: Admit: 2022-06-16 | Discharge: 2022-06-17 | Payer: PRIVATE HEALTH INSURANCE

## 2022-06-16 DIAGNOSIS — L501 Idiopathic urticaria: Principal | ICD-10-CM

## 2022-06-16 MED ADMIN — omalizumab (XOLAIR) injection 300 mg: 300 mg | SUBCUTANEOUS | @ 15:00:00 | Stop: 2022-06-16

## 2022-06-16 NOTE — Unmapped (Signed)
Patient in for Xolair injections today. Received Xolair 300 mg subcutaneously, tolerated injections well. No other complaints at this time.     Specialty    Patient educated and self administered injections during this visit.

## 2022-06-16 NOTE — Unmapped (Signed)
Patient wanted to speak to nurse about Xolair injections, stating that he feels good for two weeks after injections, but then the hives return. He said that he was hospitalized recently for possible food poisoning and a very bad hive flare up for which he received a prednisone taper. Asked patient if he was taking his atarax as needed. Patient said that he was not. Encouraged patient to continue with prescribed medication regimen. Also explained that Xolair will take several months to be effective and that its effectiveness will be reviewed with his provider at his April appointment. Printed out provider instructions from last visit to give to patient.  Patient will do home injections after this. Provided specialty pharmacy number and self-injection pamphlet.

## 2022-06-17 NOTE — Unmapped (Signed)
06/16/22-pt now admin @ home will enroll in allergy and asthma que-CB

## 2022-07-05 MED ORDER — EMPTY CONTAINER
2 refills | 0 days
Start: 2022-07-05 — End: ?

## 2022-07-05 NOTE — Unmapped (Signed)
[facility] Clinical Assessment & Refill Coordination Note    [name] received 3rd dose of Xolair in clinic on 2/23. We discussed Xolair storage requirement and shipping details. All questions were answered.     Matthew Deleon, DOB: 08-07-1960  Phone: [phone] (home)     All above HIPAA information was verified with patient.     Was a Nurse, learning disability used for this call? No    Specialty Medication(s):   Inflammatory Disorders: Xolair     Current Outpatient Medications   Medication Sig Dispense Refill    aspirin (ECOTRIN) 81 MG tablet Take 1 tablet (81 mg total) by mouth daily.      atorvastatin (LIPITOR) 80 MG tablet Take 1 tablet (80 mg total) by mouth daily.  1    buPROPion (WELLBUTRIN XL) 300 MG 24 hr tablet Take 1 tablet (300 mg total) by mouth daily.  1    cetirizine (ZYRTEC) 10 MG tablet Take 2 tablets (20 mg total) by mouth two (2) times a day. 120 tablet 11    clotrimazole (LOTRIMIN) 1 % cream Apply topically Two (2) times a day. 60 g 4    colchicine (COLCRYS) 0.6 mg tablet Take 1 tablet (0.6 mg total) by mouth once as needed.      disulfiram (ANTABUSE) 250 mg tablet Take 2 tablets (500 mg total) by mouth daily. (Patient not taking: Reported on 04/19/2022)      EPINEPHrine (EPIPEN) 0.3 mg/0.3 mL injection Inject 0.3 mL (0.3 mg total) into the muscle once as needed for anaphylaxis for up to 1 dose. 2 each 2    famotidine (PEPCID) 20 MG tablet Take 1 tablet (20 mg total) by mouth Two (2) times a day for 15 doses. 15 tablet 0    famotidine (PEPCID) 20 MG tablet Take 1 tablet (20 mg total) by mouth two (2) times a day for 5 days. 10 tablet 0    furosemide (LASIX) 20 MG tablet Take 1 tablet (20 mg total) by mouth.      hydroCHLOROthiazide (HYDRODIURIL) 25 MG tablet Take 1 tablet (25 mg total) by mouth two (2) times a day.  1    HYDROcodone-acetaminophen (NORCO) 5-325 mg per tablet Take 1 tablet by mouth every eight (8) hours as needed.      hydroxychloroquine (PLAQUENIL) 200 mg tablet Take 1 tablet (200 mg total) by mouth Two (2) times a day. 60 tablet 11    hydrOXYzine (ATARAX) 25 MG tablet Take 1 tablet (25 mg total) by mouth every eight (8) hours as needed for itching (hives). 90 tablet 3    LORazepam (ATIVAN) 2 MG tablet       metoprolol succinate (TOPROL-XL) 100 MG 24 hr tablet Take 1 tablet (100 mg total) by mouth daily.  1    olmesartan-hydrochlorothiazide (BENICAR HCT) 40-25 mg per tablet Take 1 tablet by mouth daily.      omalizumab (XOLAIR) 150 mg/mL syringe Inject the contents of 2 syringes (300 mg total) under the skin every twenty-eight (28) days. 2 mL 11    omeprazole (PRILOSEC) 20 MG capsule Take 1 capsule (20 mg total) by mouth daily.  3    tadalafiL (CIALIS) 5 MG tablet Take 1 tablet (5 mg total) by mouth daily as needed.       No current facility-administered medications for this visit.        Changes to medications: Tamim reports no changes at this time.    Allergies   Allergen Reactions  Daypro [Oxaprozin] Anaphylaxis    Hydrocodone Bitartrate Hives    Levofloxacin Hives    Seroquel [Quetiapine]        Changes to allergies: No    SPECIALTY MEDICATION ADHERENCE     Xolair 150 mg/ml: 0 days of medicine on hand     Medication Adherence    Patient reported X missed doses in the last month: 0  Specialty Medication: Xolair 150 mg/mL Q28d  Patient is on additional specialty medications: No  Patient is on more than two specialty medications: No  Any gaps in refill history greater than 2 weeks in the last 3 months: no  Demonstrates understanding of importance of adherence: yes  Informant: patient          Specialty medication(s) dose(s) confirmed: Regimen is correct and unchanged.     Are there any concerns with adherence? No    Adherence counseling provided? Not needed    CLINICAL MANAGEMENT AND INTERVENTION      Clinical Benefit Assessment:    Do you feel the medicine is effective or helping your condition? Yes    Clinical Benefit counseling provided? Not needed    Adverse Effects Assessment:    Are you experiencing any side effects? No    Are you experiencing difficulty administering your medicine? No    Quality of Life Assessment:    Quality of Life    Rheumatology  Oncology  Dermatology  Cystic Fibrosis          How many days over the past month did your chronic urticaria  keep you from your normal activities? For example, brushing your teeth or getting up in the morning. [name] states he has some breakthrough hives after 1st and 2nd dose of Xolair but states hives have not returned after receiving his 3rd dose of Xolair.     Have you discussed this with your provider? Yes    Acute Infection Status:    Acute infections noted within Epic:  No active infections  Patient reported infection: None    Therapy Appropriateness:    Is therapy appropriate and patient progressing towards therapeutic goals? Yes, therapy is appropriate and should be continued    DISEASE/MEDICATION-SPECIFIC INFORMATION      For patients on injectable medications: Patient currently has 0 doses left.  Next injection is scheduled for 3/22.    Chronic Inflammatory Diseases: Have you experienced any flares in the last month? No    PATIENT SPECIFIC NEEDS     Does the patient have any physical, cognitive, or cultural barriers? No    Is the patient high risk? No    Did the patient require a clinical intervention? No    Does the patient require physician intervention or other additional services (i.e., nutrition, smoking cessation, social work)? No    SOCIAL DETERMINANTS OF HEALTH     At the [facility], we have learned that life circumstances - like trouble affording food, housing, utilities, or transportation can affect the health of many of our patients.   That is why we wanted to ask: are you currently experiencing any life circumstances that are negatively impacting your health and/or quality of life? Patient declined to answer    Social Determinants of Health     Financial Resource Strain: Not on file   Internet Connectivity: Not on file   Food Insecurity: Not on file   Tobacco Use: High Risk (05/03/2022)    Patient History     Smoking Tobacco Use: Every Day  Smokeless Tobacco Use: Never     Passive Exposure: Not on file   Housing/Utilities: Not on file   Alcohol Use: Not on file   Transportation Needs: Not on file   Substance Use: Not on file   Health Literacy: Low Risk  (09/13/2021)    Health Literacy     : Never   Physical Activity: Not on file   Interpersonal Safety: Not on file   Stress: Not on file   Intimate Partner Violence: Not on file   Depression: Not at risk (09/09/2020)    PHQ-2     PHQ-2 Score: 0   Social Connections: Not on file       Would you be willing to receive help with any of the needs that you have identified today? Not applicable       SHIPPING     Specialty Medication(s) to be Shipped:   Inflammatory Disorders: Xolair    Other medication(s) to be shipped:  sharps container     Changes to insurance: No    Patient was informed of new phone menu: No    Delivery Scheduled: Yes, Expected medication delivery date: 07/11/22.     Medication will be delivered via UPS to the confirmed prescription address in [facility].    The patient will receive a drug information handout for each medication shipped and additional FDA Medication Guides as required.  Verified that patient has previously received a Conservation officer, historic buildings and a Surveyor, mining.    The patient or caregiver noted above participated in the development of this care plan and knows that they can request review of or adjustments to the care plan at any time.      All of the patient's questions and concerns have been addressed.    [name], PharmD   [facility] Specialty Pharmacist

## 2022-07-10 MED FILL — XOLAIR 150 MG/ML SUBCUTANEOUS SYRINGE: SUBCUTANEOUS | 28 days supply | Qty: 2 | Fill #3

## 2022-07-10 MED FILL — EMPTY CONTAINER: 120 days supply | Qty: 1 | Fill #0

## 2022-08-01 NOTE — Unmapped (Signed)
[facility] Refill Coordination Note    Matthew Deleon, Matthew Deleon: [birth year]  Phone: [phone] (home)       All above HIPAA information was verified with patient.         07/31/2022    10:08 AM   Specialty Rx Medication Refill Questionnaire   Which Medications would you like refilled and shipped? Xolair 150 ml   Please list all current allergies: Day pro , levaquin.   Have you missed any doses in the last 30 days? No   Have you had any changes to your medication(s) since your last refill? No   How many days remaining of each medication do you have at home? 0   If receiving an injectable medication, next injection date is 08/11/2022   Have you experienced any side effects in the last 30 days? No   Please enter the full address (street address, city, state, zip code) where you would like your medication(s) to be delivered to. [address] [place] [address]   Please specify on which day you would like your medication(s) to arrive. Note: if you need your medication(s) within 3 days, please call the pharmacy to schedule your order at [phone]  08/08/2022   Has your insurance changed since your last refill? No   Would you like a pharmacist to call you to discuss your medication(s)? No   Do you require a signature for your package? (Note: if we are billing Medicare Part B or your order contains a controlled substance, we will require a signature) No         Completed refill call assessment today to schedule patient's medication shipment from the [facility] [phone]).  All relevant notes have been reviewed.       Confirmed patient received a Conservation officer, historic buildings and a Surveyor, mining with first shipment. The patient will receive a drug information handout for each medication shipped and additional FDA Medication Guides as required.         REFERRAL TO PHARMACIST     Referral to the pharmacist: Not needed      [facility]     Shipping address confirmed in Epic.     Delivery Scheduled: Yes, Expected medication delivery date: 08/08/22.     Medication will be delivered via UPS to the prescription address in Epic [facility].    [facility] Specialty Technician

## 2022-08-07 MED FILL — XOLAIR 150 MG/ML SUBCUTANEOUS SYRINGE: SUBCUTANEOUS | 28 days supply | Qty: 2 | Fill #4

## 2022-08-11 ENCOUNTER — Ambulatory Visit: Admit: 2022-08-11 | Discharge: 2022-08-12 | Payer: PRIVATE HEALTH INSURANCE

## 2022-08-11 DIAGNOSIS — L501 Idiopathic urticaria: Principal | ICD-10-CM

## 2022-08-11 MED ORDER — CETIRIZINE 10 MG TABLET
ORAL_TABLET | Freq: Two times a day (BID) | ORAL | 11 refills | 30 days | Status: CP
Start: 2022-08-11 — End: 2023-08-11

## 2022-08-11 NOTE — Unmapped (Addendum)
-   Stop they hydroxychloroquine (plaquenil)  - Continue cetirizine (Zyrtec) 2 pills twice daily  - Continue Xolair injections every 4 weeks; let me know if you continue to flare the week before your dose is due and we will change the prescription to every 21 days and get insurance approval for this.

## 2022-08-11 NOTE — Unmapped (Addendum)
[facility]  [address]  5th Floor, [address]  [place], [place] [address]    Assessment and Plan:   Matthew Deleon is a 62 y.o. male that was seen in follow-up for the evaluation:    Chronic urticaria  -Now well-controlled  -Continue Xolair 300 mg every 28 days.  He has responded appropriately to this with no breakthrough symptoms over the last 2 months.  However, we did discuss that if a pattern develops in which she often has flares in the week leading up to his next Xolair injection, we will change his frequency to every 21 days and send a new prescription for this.  -Now that he has achieved adequate control and we do not feel that he had any additional benefit from hydroxychloroquine, this can be discontinued.  -Continue cetirizine 20 mg twice daily for now.  If he is well-controlled next 3 months, we will discuss a taper schedule.    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  Since he was tolerating this well for 4-1/2 years since his initial diagnosis, we do not believe that elevated IgE is actually clinically relevant at this time. This likely represents sensitization alone which is not uncommon in up to 30% of the population in the [place] where tick bites are frequent.  -Given he has introduced beef and dairy without issues, he may continue to consume these foods and reintroduce pork as desired.     No orders of the defined types were placed in this encounter.    Return in about 3 months (around 11/10/2022) for Next scheduled follow up.    I personally spent over 30 minutes face-to-face and non-face-to-face in the care of this patient, which includes all pre, intra, and post visit time on the date of service.  All documented time was specific to the E/M visit and does not include any procedures that may have been performed.    Danton Sewer, M.D.   Division of Allergy and Immunology        Subjective   Last Visit: 04/11/2022    HISTORY OF PRESENT ILLNESS:  Matthew Deleon is a 62 y.o. male who presented for follow-up of chronic urticaria and angioedema. History is obtained from Matthew Deleon. A thorough review of the medical records was also performed.    Interval history:   At the previous visit, we did apply for Xolair 300 mg every 28 days.  He initiated this in December and thus far has received 4 injections with this was injection dose due today.  After the first 2 injections, he had significant improvement for 2 weeks.  However, in the latter 2 weeks prior to his next Xolair injection, he would flare with either pruritus or urticaria.  This led to 2 additional systemic steroid courses since he did visit either in urgent care and ER during these time periods of flares.  However, after the last 2 injections he has tolerated this quite well without breakthrough symptoms of pruritus or urticaria.  He remains on cetirizine 20 mg twice daily.  He also remains on hydroxychloroquine 1 tablet twice daily.  He has had minimal use of hydroxyzine as he found this to be too sedating.    While he has  tolerated some beef in his diet, he has been hesitant to liberalize his diet any further but would like to consume additional beef and pork products.  He tolerates dairy well.    Past Medical History:     Past Medical History:   Diagnosis Date    Allergies     Arthritis     Asthma     GERD (gastroesophageal reflux disease)     Gout     Hyperlipidemia     Hypertension     Shortness of breath      Medications:     Current Outpatient Medications   Medication Sig Dispense Refill    aspirin (ECOTRIN) 81 MG tablet Take 1 tablet (81 mg total) by mouth daily.      atorvastatin (LIPITOR) 80 MG tablet Take 1 tablet (80 mg total) by mouth daily.  1    buPROPion (WELLBUTRIN XL) 300 MG 24 hr tablet Take 1 tablet (300 mg total) by mouth daily.  1    cetirizine (ZYRTEC) 10 MG tablet Take 2 tablets (20 mg total) by mouth two (2) times a day. 120 tablet 11    clotrimazole (LOTRIMIN) 1 % cream Apply topically Two (2) times a day. 60 g 4    colchicine (COLCRYS) 0.6 mg tablet Take 1 tablet (0.6 mg total) by mouth once as needed.      disulfiram (ANTABUSE) 250 mg tablet Take 2 tablets (500 mg total) by mouth daily. (Patient not taking: Reported on 04/19/2022)      empty container Misc Use as directed to dispose of Xolair syringes 1 each 2    EPINEPHrine (EPIPEN) 0.3 mg/0.3 mL injection Inject 0.3 mL (0.3 mg total) into the muscle once as needed for anaphylaxis for up to 1 dose. 2 each 2    famotidine (PEPCID) 20 MG tablet Take 1 tablet (20 mg total) by mouth Two (2) times a day for 15 doses. 15 tablet 0    famotidine (PEPCID) 20 MG tablet Take 1 tablet (20 mg total) by mouth two (2) times a day for 5 days. 10 tablet 0    furosemide (LASIX) 20 MG tablet Take 1 tablet (20 mg total) by mouth.      hydroCHLOROthiazide (HYDRODIURIL) 25 MG tablet Take 1 tablet (25 mg total) by mouth two (2) times a day.  1    HYDROcodone-acetaminophen (NORCO) 5-325 mg per tablet Take 1 tablet by mouth every eight (8) hours as needed.      hydroxychloroquine (PLAQUENIL) 200 mg tablet Take 1 tablet (200 mg total) by mouth Two (2) times a day. 60 tablet 11    hydrOXYzine (ATARAX) 25 MG tablet Take 1 tablet (25 mg total) by mouth every eight (8) hours as needed for itching (hives). 90 tablet 3    LORazepam (ATIVAN) 2 MG tablet       metoprolol succinate (TOPROL-XL) 100 MG 24 hr tablet Take 1 tablet (100 mg total) by mouth daily.  1    olmesartan-hydrochlorothiazide (BENICAR HCT) 40-25 mg per tablet Take 1 tablet by mouth daily.      omalizumab (XOLAIR) 150 mg/mL syringe Inject the contents of 2 syringes (300 mg total) under the skin every twenty-eight (28) days. 2 mL 11    omeprazole (PRILOSEC) 20 MG capsule Take 1 capsule (20 mg total) by mouth daily.  3    tadalafiL (CIALIS) 5 MG tablet Take 1 tablet (5 mg total) by mouth daily as needed.       No current  facility-administered medications for this visit.     Allergies:     Allergies   Allergen Reactions    Daypro [Oxaprozin] Anaphylaxis    Hydrocodone Bitartrate Hives    Levofloxacin Hives    Seroquel [Quetiapine]      Objective:   PHYSICAL EXAM:  Vitals: BP 115/73 (BP Site: L Arm, BP Position: Sitting)  - Pulse 61  - SpO2 94%   Constitutional: Sitting comfortably in no acute distress.  Skin: No longer has annular and scaling lesions with some degree of central clearing located on his upper chest. Does have a few small hives present on scalp.   Ext: Warm and well perfused without cyanosis, clubbing, or edema.  Neuro: Awake, alert and oriented to person, place, and time.  Psych: Normal affect.

## 2022-08-28 NOTE — Unmapped (Signed)
[facility] Refill Coordination Note    Matthew Deleon, [place]: 29-Jul-1960  Phone: [phone] (home)       All above HIPAA information was verified with patient.         08/27/2022    12:51 PM   Specialty Rx Medication Refill Questionnaire   Which Medications would you like refilled and shipped? Xolair   Please list all current allergies: Daypro   Have you missed any doses in the last 30 days? No   Have you had any changes to your medication(s) since your last refill? No   How many days remaining of each medication do you have at home? None   If receiving an injectable medication, next injection date is 09/08/2022   Have you experienced any side effects in the last 30 days? No   Please enter the full address (street address, city, state, zip code) where you would like your medication(s) to be delivered to. [address] [place] [address]   Please specify on which day you would like your medication(s) to arrive. Note: if you need your medication(s) within 3 days, please call the pharmacy to schedule your order at [phone]  09/04/2022   Has your insurance changed since your last refill? No   Would you like a pharmacist to call you to discuss your medication(s)? No   Do you require a signature for your package? (Note: if we are billing Medicare Part B or your order contains a controlled substance, we will require a signature) No         Completed refill call assessment today to schedule patient's medication shipment from the [facility] [phone]).  All relevant notes have been reviewed.       Confirmed patient received a Conservation officer, historic buildings and a Surveyor, mining with first shipment. The patient will receive a drug information handout for each medication shipped and additional FDA Medication Guides as required.         REFERRAL TO PHARMACIST     Referral to the pharmacist: Not needed      [facility]     Shipping address confirmed in Epic.     Delivery Scheduled: Yes, Expected medication delivery date: 09/05/22.     Medication will be delivered via UPS to the prescription address in Epic WAM.    Matthew Deleon' W Matthew Deleon Shared [name] Specialty Technician

## 2022-09-04 MED FILL — XOLAIR 150 MG/ML SUBCUTANEOUS SYRINGE: SUBCUTANEOUS | 28 days supply | Qty: 2 | Fill #5

## 2022-09-06 ENCOUNTER — Ambulatory Visit: Admit: 2022-09-06 | Discharge: 2022-09-07 | Payer: PRIVATE HEALTH INSURANCE

## 2022-09-06 LAB — LIPID PANEL
CHOLESTEROL/HDL RATIO SCREEN: 2.4 (ref 1.0–4.5)
CHOLESTEROL: 125 mg/dL (ref <=200)
HDL CHOLESTEROL: 52 mg/dL (ref 40–60)
LDL CHOLESTEROL CALCULATED: 44 mg/dL (ref 40–100)
NON-HDL CHOLESTEROL: 73 mg/dL (ref 70–130)
TRIGLYCERIDES: 145 mg/dL (ref 0–150)
VLDL CHOLESTEROL CAL: 29 mg/dL (ref 12–42)

## 2022-09-06 NOTE — Unmapped (Signed)
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 Essential (primary) hypertension  - Remains well controlled  - Continue current medications  - Follow DASH diet    3. Dyslipidemia  - Continue Lipitor 80 mg daily  - Repeat panel today    4. Tobacco abuse  - Still smoking about 1/2 ppd  - Discussed smoking cessation, counseled again  - He has stopped before and would like to try on his own  - Already on Wellbutrin    5. Alcohol abuse  - Has but back from a 6 pack daily to about 1 or 2 daily, no hard alcohol  - Discussed cessation again    6. Morbid obesity - BMI >40  - No change in weight since last visit  - Reduce caloric intake  - Start daily exercise in form of walking and progress from there; discussed again in detail    7. OSA (obstructive sleep apnea)   - CPAP non-compliance  - Refer back to PCP/sleep medicine    8. Family history of coronary arteriosclerosis  - Mom and siblings      As above, reports no new symptoms.  We discussed starting daily exercise regimen, decreasing caloric intake and to continue with risk factor modification, stop smoking cigarettes and use his CPAP.  He voiced understanding and agreement.     Return in 12 months or sooner if needed    Subjective:      Patient ID: Matthew Deleon is a 61 y.o.male.    62 year old gentleman evaluated at request of his primary care physician in regards to dyspnea and palpitation on exertion and previously atypical chest discomfort. He has past medical history of essential hypertension, dyslipidemia, morbid obesity and obstructive sleep apnea with CPAP noncompliance, ongoing cigarette smoking 1 pack/day, alcohol abuse with at least 6 pack of beer daily ongoing for a number of years, anxiety and depression. He had developed chest discomfort early on in 2020 and evaluated in the emergency department and subsequently seen by: Cardiologist. At that time there was plan for coronary CTA however due to the pandemic this was never done. Since that he has been relatively sedentary, he lost his wife in automobile accident and appears that he has been self-medicating mostly with alcohol. He has had significant weight gain from 220 pounds now up to 268 pounds. He reports significant exertional dyspnea with ordering test such as walking or when he goes out fishing as well as exertional palpitations to where he feels like his heart is jumping out of his chest. He denies any frank chest pain or significant pressure, but again his overall activity is low due to aforementioned symptoms. He denies any significant edema, orthopnea or PND. He denies any sensation of his heart racing and only has activity related palpitation and not at rest. He denies presyncope or syncope. He states that he still has a CPAP machine however he has not been using it due to it causing him to have chest discomfort. He has been compliant with medications and tolerating well, he states that his blood pressure has been well controlled. He had quit smoking for approximately 2 years previously and would  like to try smoking cessation on his own again.    Interval history:  He reports doing okay, shortness of breath is at baseline and no edema.  He has been taking his medications as directed and tolerating well.  BP is well controlled.  He is still smoking cigarettes (about 1/2 ppd) and drinking beers daily (down to 1 to 2 beers from 6 daily).  He has not been active, no exercise or weight loss.  He is not using CPAP, reports unable to tolerate the mask.      The following portions of the patient's history were reviewed and updated as appropriate: allergies, current medications, past family history, past medical history, past social history, past surgical history and problem list.    Review of Systems   Constitutional:  Positive for fatigue.   HENT: Negative.     Eyes: Negative.    Respiratory:  Positive for shortness of breath (chronic, unchanged). Negative for chest tightness.    Cardiovascular:  Negative for chest pain, palpitations and leg swelling.   Gastrointestinal: Negative.    Endocrine: Negative.    Genitourinary: Negative.    Musculoskeletal: Negative.    Skin: Negative.    Allergic/Immunologic: Negative.    Neurological:  Negative for dizziness, tremors, seizures, syncope, facial asymmetry, speech difficulty, weakness, light-headedness, numbness and headaches.   Hematological: Negative.    Psychiatric/Behavioral:  Positive for sleep disturbance. Negative for self-injury. The patient is not nervous/anxious.           Objective:        Vitals:    09/06/22 1017   BP: 106/64   Pulse: 62   Resp: 16   Temp: 36.8 ??C (98.2 ??F)   SpO2: 96%     Physical Exam  Vitals reviewed.   Constitutional:       Appearance: Normal appearance. He is obese.   HENT:      Head: Normocephalic and atraumatic.      Mouth/Throat:      Mouth: Mucous membranes are moist.   Eyes:      Extraocular Movements: Extraocular movements intact.      Pupils: Pupils are equal, round, and reactive to light.   Cardiovascular:      Rate and Rhythm: Normal rate and regular rhythm.      Pulses: Normal pulses.      Comments: Distant heart sounds  Pulmonary:      Effort: Pulmonary effort is normal.      Breath sounds: Normal breath sounds.      Comments: Increased AP diameter, barrel chested  Abdominal:      General: Bowel sounds are normal. There is distension.      Palpations: Abdomen is soft.      Tenderness: There is no abdominal tenderness. There is no guarding or rebound.      Hernia: No hernia is present.   Musculoskeletal:         General: Normal range of motion.      Cervical back: Normal range of motion and neck supple.      Right lower leg: Edema (trace) present.      Left lower leg: Edema (trace) present.   Skin:     General: Skin is warm and dry.   Neurological:      General: No focal deficit present.      Mental Status: He is alert and oriented to person, place, and time.   Psychiatric:         Mood and Affect:  Mood normal.         Behavior: Behavior normal.            Medications:     Current Outpatient Medications   Medication Instructions    aspirin (ECOTRIN) 81 mg, Oral, Daily (standard)    atorvastatin (LIPITOR) 80 mg, Oral, Daily (standard)    buPROPion (WELLBUTRIN XL) 300 mg, Oral, Daily (standard)    cetirizine (ZYRTEC) 20 mg, Oral, 2 times a day    clotrimazole (LOTRIMIN) 1 % cream Topical, 2 times a day (standard)    colchicine (COLCRYS) 0.6 mg, Oral, Once as needed    disulfiram (ANTABUSE) 500 mg, Daily (standard)    empty container Misc Use as directed to dispose of Xolair syringes    EPINEPHrine (EPIPEN) 0.3 mg, Intramuscular, Once as needed    furosemide (LASIX) 20 mg, Oral    hydroCHLOROthiazide (HYDRODIURIL) 25 mg, Oral, 2 times a day (standard)    HYDROcodone-acetaminophen (NORCO) 5-325 mg per tablet 1 tablet, Oral, Every 8 hours PRN    hydrOXYzine (ATARAX) 25 mg, Oral, Every 8 hours PRN    LORazepam (ATIVAN) 2 MG tablet     metoPROLOL succinate (TOPROL-XL) 100 mg, Oral, Daily (standard)    olmesartan-hydrochlorothiazide (BENICAR HCT) 40-25 mg per tablet 1 tablet, Oral, Daily (standard)    omalizumab (XOLAIR) 150 mg/mL syringe Inject the contents of 2 syringes (300 mg total) under the skin every twenty-eight (28) days.    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 The left ventricle is normal in size with mildly increased wall thickness.    2. The left ventricular systolic function is normal, LVEF is visually estimated at 60-65%.    3. There is grade I diastolic dysfunction (impaired relaxation).    4. The mitral valve leaflets are mildly thickened with normal leaflet mobility.    5. Mitral annular calcification is present (mild).    6. The aortic valve is trileaflet with mildly thickened leaflets with normal excursion.    7. The left atrium is mildly dilated in size.    8. The right ventricle is normal in size, with normal systolic function.    Coronary CT: None    Stress test: PET/CT 12/2019  IMPRESSIONS:  - Normal myocardial perfusion study  - No evidence of any significant ischemia or scar  - Perfusion images demonstrate homogeneous tracer distribution throughout the myocardium with no stress induced perfusion abnormalities.  - Left ventricular systolic function is normal. Post stress the ejection fraction is > 60%.  - Coronary calcifications are noted    CMR: None    Device check: N/A    Follow up:     Return in about 1 year (around 09/06/2023).    Delara Shepheard DO [employer]  Division of Cardiology   [facility]

## 2022-09-06 NOTE — Unmapped (Signed)
Addended by: Rubye Beach on: 09/06/2022 10:59 AM     Modules accepted: Orders

## 2022-09-26 NOTE — Unmapped (Signed)
[facility] Specialty Pharmacy Refill Coordination Note    Matthew Deleon, [name]: 1961-01-14  Phone: [phone] (home)       All above HIPAA information was verified with patient.         09/25/2022     5:23 PM   Specialty Rx Medication Refill Questionnaire   Which Medications would you like refilled and shipped? Xolair 150mg /ml syringe   Please list all current allergies: Daypro   Have you missed any doses in the last 30 days? No   Have you had any changes to your medication(s) since your last refill? No   How many days remaining of each medication do you have at home? 0   If receiving an injectable medication, next injection date is 10/06/2022   Have you experienced any side effects in the last 30 days? No   Please enter the full address (street address, city, state, zip code) where you would like your medication(s) to be delivered to. [address] [place] [address]   Please specify on which day you would like your medication(s) to arrive. Note: if you need your medication(s) within 3 days, please call the pharmacy to schedule your order at [phone]  09/29/2022   Has your insurance changed since your last refill? No   Would you like a pharmacist to call you to discuss your medication(s)? No   Do you require a signature for your package? (Note: if we are billing Medicare Part B or your order contains a controlled substance, we will require a signature) No   Additional Comments: Thank you.         Completed refill call assessment today to schedule patient's medication shipment from the [facility] [phone]).  All relevant notes have been reviewed.       Confirmed patient received a Conservation officer, historic buildings and a Surveyor, mining with first shipment. The patient will receive a drug information handout for each medication shipped and additional FDA Medication Guides as required.         REFERRAL TO PHARMACIST     Referral to the pharmacist: Not needed      [facility]     Shipping address confirmed in Epic.     Delivery Scheduled: Yes, Expected medication delivery date: 09/29/22.     Medication will be delivered via UPS to the prescription address in Epic WAM.    Matthew Deleon' W Matthew Deleon Shared [facility] Specialty Technician

## 2022-09-28 MED FILL — XOLAIR 150 MG/ML SUBCUTANEOUS SYRINGE: SUBCUTANEOUS | 28 days supply | Qty: 2 | Fill #6

## 2022-10-19 NOTE — Unmapped (Signed)
[facility] Specialty Pharmacy Refill Coordination Note    Matthew Deleon, [place]: 04/18/1961  Phone: [phone] (home)       All above HIPAA information was verified with patient.         10/18/2022     7:17 PM   Specialty Rx Medication Refill Questionnaire   Which Medications would you like refilled and shipped? Xolair   Please list all current allergies: Daypro   Have you missed any doses in the last 30 days? No   Have you had any changes to your medication(s) since your last refill? No   How many days remaining of each medication do you have at home? None   If receiving an injectable medication, next injection date is 11/01/2022   Have you experienced any side effects in the last 30 days? No   Please enter the full address (street address, city, state, zip code) where you would like your medication(s) to be delivered to. [address] [place] [address]   Please specify on which day you would like your medication(s) to arrive. Note: if you need your medication(s) within 3 days, please call the pharmacy to schedule your order at [phone]  11/01/2022   Has your insurance changed since your last refill? No   Would you like a pharmacist to call you to discuss your medication(s)? No   Do you require a signature for your package? (Note: if we are billing Medicare Part B or your order contains a controlled substance, we will require a signature) No   Additional Comments: Thank you.         Completed refill call assessment today to schedule patient's medication shipment from the [facility] [phone]).  All relevant notes have been reviewed.       Confirmed patient received a Conservation officer, historic buildings and a Surveyor, mining with first shipment. The patient will receive a drug information handout for each medication shipped and additional FDA Medication Guides as required.         REFERRAL TO PHARMACIST     Referral to the pharmacist: Not needed      [facility]     Shipping address confirmed in Epic. Delivery Scheduled: Yes, Expected medication delivery date: 11/01/22.     Medication will be delivered via UPS to the prescription address in Epic WAM.    Renia Mikelson' W Danae Chen Shared [facility] Specialty Technician

## 2022-10-31 MED FILL — XOLAIR 150 MG/ML SUBCUTANEOUS SYRINGE: SUBCUTANEOUS | 28 days supply | Qty: 2 | Fill #7

## 2022-11-14 ENCOUNTER — Ambulatory Visit: Admit: 2022-11-14 | Discharge: 2022-11-15 | Payer: PRIVATE HEALTH INSURANCE

## 2022-11-14 DIAGNOSIS — L501 Idiopathic urticaria: Principal | ICD-10-CM

## 2022-11-14 NOTE — Unmapped (Signed)
[facility]  [address]  5th Floor, [address]  [place], [place] [address]    Assessment and Plan:   Matthew Deleon is a 62 y.o. male that was seen in follow-up for the evaluation:    Chronic urticaria  -Now well-controlled  -Continue Xolair 300 mg every 28 days.   -Since he is experiencing some fatigue and drowsiness which he is attributing to cetirizine, we discussed a few options.   No change to cetirizine  Recommend decreasing cetirizine to 1 tablet in the morning and 2 tablets in the evening.  If he has no increase in breakthrough symptoms of pruritus or urticaria that are intolerable, he can further decrease this to 1 tablet twice daily.  Alternatively, we did discuss fexofenadine 2 tablets twice daily if he is unable to wean cetirizine as noted above.    No orders of the defined types were placed in this encounter.    Return in about 3 months (around 02/14/2023) for Next scheduled follow up.    I personally spent over 30 minutes face-to-face and non-face-to-face in the care of this patient, which includes all pre, intra, and post visit time on the date of service.  All documented time was specific to the E/M visit and does not include any procedures that may have been performed.    Matthew Deleon, M.D.   Division of Allergy and Immunology        Subjective   Last Visit: 08/11/22    HISTORY OF PRESENT ILLNESS:  Matthew Deleon is a 62 y.o. male who presented for follow-up of chronic urticaria and angioedema. History is obtained from Matthew Deleon. A thorough review of the medical records was also performed.    Interval history:   He remains on Xolair 300 mg every 4 weeks.  This was initiated in December 2023.  While he continues to have breakthrough symptoms after the first 2 injections, since that time he has remained fairly well-controlled.  Over the last 3 months, he believes he may have seen hives on 1 or 2 occasions but much less severe than prior episodes and did not require additional treatment.  He does experience pruritus more often, occurring perhaps 2-4 times per week but occurs more often when he spends time outside.  He does describe an pins and needle sensation in his lower legs which she feels like may be related to pruritus.  He did discontinue his hydroxychloroquine after the last visit.  He does remain on cetirizine 2 tablets twice daily.  He does wonder whether high-dose of cetirizine is resulting in increased fatigue and drowsiness.    Past Medical History:     Past Medical History:   Diagnosis Date    Allergies     Arthritis     Asthma     GERD (gastroesophageal reflux disease)     Gout     Hyperlipidemia     Hypertension     Shortness of breath      Medications:     Current Outpatient Medications   Medication Sig Dispense Refill    aspirin (ECOTRIN) 81 MG tablet Take 1 tablet (81 mg total) by mouth daily.      atorvastatin (LIPITOR) 80 MG tablet Take 1 tablet (80 mg total) by mouth daily.  1    buPROPion (WELLBUTRIN XL) 300 MG 24 hr tablet Take 1 tablet (300 mg total) by mouth daily.  1    cetirizine (ZYRTEC) 10 MG tablet Take  2 tablets (20 mg total) by mouth two (2) times a day. 120 tablet 11    clotrimazole (LOTRIMIN) 1 % cream Apply topically Two (2) times a day. 60 g 4    colchicine (COLCRYS) 0.6 mg tablet Take 1 tablet (0.6 mg total) by mouth once as needed.      empty container Misc Use as directed to dispose of Xolair syringes 1 each 2    EPINEPHrine (EPIPEN) 0.3 mg/0.3 mL injection Inject 0.3 mL (0.3 mg total) into the muscle once as needed for anaphylaxis for up to 1 dose. 2 each 2    furosemide (LASIX) 20 MG tablet Take 1 tablet (20 mg total) by mouth.      hydroCHLOROthiazide (HYDRODIURIL) 25 MG tablet Take 1 tablet (25 mg total) by mouth two (2) times a day.  1    HYDROcodone-acetaminophen (NORCO) 5-325 mg per tablet Take 1 tablet by mouth every eight (8) hours as needed.      hydrOXYzine (ATARAX) 25 MG tablet Take 1 tablet (25 mg total) by mouth every eight (8) hours as needed for itching (hives). 90 tablet 3    LORazepam (ATIVAN) 2 MG tablet       metoprolol succinate (TOPROL-XL) 100 MG 24 hr tablet Take 1 tablet (100 mg total) by mouth daily.  1    olmesartan-hydrochlorothiazide (BENICAR HCT) 40-25 mg per tablet Take 1 tablet by mouth daily.      omalizumab (XOLAIR) 150 mg/mL syringe Inject the contents of 2 syringes (300 mg total) under the skin every twenty-eight (28) days. 2 mL 11    omeprazole (PRILOSEC) 20 MG capsule Take 1 capsule (20 mg total) by mouth daily.  3    tadalafiL (CIALIS) 5 MG tablet Take 1 tablet (5 mg total) by mouth daily as needed.      disulfiram (ANTABUSE) 250 mg tablet Take 2 tablets (500 mg total) by mouth daily. (Patient not taking: Reported on 04/19/2022)       No current facility-administered medications for this visit.     Allergies:     Allergies   Allergen Reactions    Daypro [Oxaprozin] Anaphylaxis    Hydrocodone Bitartrate Hives    Levofloxacin Hives    Seroquel [Quetiapine]      Objective:   PHYSICAL EXAM:  Vitals: BP 122/71 (BP Site: L Arm, BP Position: Sitting, BP Cuff Size: Large)  - Pulse 66  - SpO2 95%   Constitutional: Sitting comfortably in no acute distress.  Skin: No longer has annular and scaling lesions with some degree of central clearing located on his upper chest. Does have a few small hives present on scalp.   Ext: Warm and well perfused without cyanosis, clubbing, or edema.  Neuro: Awake, alert and oriented to person, place, and time.  Psych: Normal affect.

## 2022-11-14 NOTE — Unmapped (Signed)
-   continue Xolair every 4 weeks  - OK to reduce cetirizine to 1 tablet in the morning and 2 in the evening. If you do well from an itching and hive standpoint, then OK to reduce to 1 in the morning and 1 in the evening.

## 2022-11-17 ENCOUNTER — Ambulatory Visit
Admit: 2022-11-17 | Discharge: 2022-11-17 | Disposition: A | Discharge status: Home / Self Care | Payer: PRIVATE HEALTH INSURANCE

## 2022-11-17 ENCOUNTER — Emergency Department
Admit: 2022-11-17 | Discharge: 2022-11-17 | Disposition: A | Discharge status: Home / Self Care | Payer: PRIVATE HEALTH INSURANCE

## 2022-11-17 DIAGNOSIS — M25561 Pain in right knee: Principal | ICD-10-CM

## 2022-11-17 LAB — BASIC METABOLIC PANEL
ANION GAP: 12 mmol/L — ABNORMAL HIGH (ref 3–11)
BLOOD UREA NITROGEN: 21 mg/dL — ABNORMAL HIGH (ref 8–20)
BUN / CREAT RATIO: 15
CALCIUM: 8.8 mg/dL (ref 8.5–10.1)
CHLORIDE: 95 mmol/L — ABNORMAL LOW (ref 98–107)
CO2: 27 mmol/L (ref 21.0–32.0)
CREATININE: 1.42 mg/dL — ABNORMAL HIGH (ref 0.80–1.30)
EGFR CKD-EPI (2021) MALE: 56 mL/min/{1.73_m2} — ABNORMAL LOW (ref >=60)
GLUCOSE RANDOM: 122 mg/dL (ref 70–179)
POTASSIUM: 3.6 mmol/L (ref 3.5–5.0)
SODIUM: 134 mmol/L — ABNORMAL LOW (ref 135–145)

## 2022-11-17 LAB — CBC W/ AUTO DIFF
BASOPHILS ABSOLUTE COUNT: 0.1 10*9/L (ref 0.0–0.2)
BASOPHILS RELATIVE PERCENT: 0.5 %
EOSINOPHILS ABSOLUTE COUNT: 0.5 10*9/L — ABNORMAL HIGH (ref 0.0–0.4)
EOSINOPHILS RELATIVE PERCENT: 4.8 %
HEMATOCRIT: 41.9 % (ref 36.0–50.0)
HEMOGLOBIN: 14.8 g/dL (ref 12.5–17.0)
LYMPHOCYTES ABSOLUTE COUNT: 2 10*9/L (ref 0.7–4.5)
LYMPHOCYTES RELATIVE PERCENT: 20.1 %
MEAN CORPUSCULAR HEMOGLOBIN CONC: 35.3 g/dL (ref 32.0–36.0)
MEAN CORPUSCULAR HEMOGLOBIN: 31.8 pg (ref 27.0–34.0)
MEAN CORPUSCULAR VOLUME: 90.1 fL (ref 80.0–98.0)
MEAN PLATELET VOLUME: 9.7 fL (ref 7.4–10.4)
MONOCYTES ABSOLUTE COUNT: 0.9 10*9/L (ref 0.1–1.0)
MONOCYTES RELATIVE PERCENT: 9.1 %
NEUTROPHILS ABSOLUTE COUNT: 6.6 10*9/L (ref 1.8–7.8)
NEUTROPHILS RELATIVE PERCENT: 64.7 %
PLATELET COUNT: 166 10*9/L (ref 140–415)
RED BLOOD CELL COUNT: 4.65 10*12/L (ref 4.10–5.60)
RED CELL DISTRIBUTION WIDTH: 12.9 % (ref 11.5–14.5)
WBC ADJUSTED: 10.2 10*9/L (ref 4.0–10.5)

## 2022-11-17 LAB — SEDIMENTATION RATE: ERYTHROCYTE SEDIMENTATION RATE: 39 mm/h — ABNORMAL HIGH (ref 15–20)

## 2022-11-17 MED ORDER — DOXYCYCLINE HYCLATE 100 MG CAPSULE
ORAL_CAPSULE | Freq: Two times a day (BID) | ORAL | 0 refills | 10 days | Status: CP
Start: 2022-11-17 — End: 2022-11-27

## 2022-11-17 MED ADMIN — ondansetron (ZOFRAN) injection 4 mg: 4 mg | INTRAVENOUS | @ 20:00:00 | Stop: 2022-11-17 | NDC 71930001752

## 2022-11-17 MED ADMIN — iohexol (OMNIPAQUE) 350 mg iodine/mL solution 64 mL: 64 mL | INTRAVENOUS | @ 19:00:00 | Stop: 2022-11-17 | NDC 00407141498

## 2022-11-17 MED ADMIN — fentaNYL (PF) (SUBLIMAZE) injection 75 mcg: 75 ug | INTRAVENOUS | @ 20:00:00 | Stop: 2022-11-17 | NDC 68258302401

## 2022-11-17 MED ADMIN — doxycycline (VIBRA-TABS) tablet 100 mg: 100 mg | ORAL | @ 18:00:00 | Stop: 2022-11-17 | NDC 76420056250

## 2022-11-17 MED ADMIN — clindamycin (CLEOCIN) 900 mg/50 mL IVPB 900 mg: 900 mg | INTRAVENOUS | @ 19:00:00 | Stop: 2022-11-17 | NDC 47781062194

## 2022-11-17 NOTE — Unmapped (Addendum)
[facility]   Emergency Department Provider Note         History     Chief Complaint  Insect Bite      HPI   Matthew Deleon is a 62 y.o. male presents with being bitten on the lateral superior aspect of the above the knee on the right side of the right knee.  Patient was bitten by a tick on the superior lateral aspect above the right knee and then started swelling of the right knee with pain with movement occurred along with redness of the area as well.  Have been having increasing pain and difficulty with walking because of the pain and discomfort and swelling and redness of the right knee and just above the knee and below the knee as well.        Past Medical History:   Diagnosis Date    Allergies     Arthritis     Asthma     GERD (gastroesophageal reflux disease)     Gout     Hyperlipidemia     Hypertension     Shortness of breath        Past Surgical History:   Procedure Laterality Date    KNEE SURGERY Bilateral 08/2011    right knee/meniscus    ROTATOR CUFF REPAIR  03/07/2018       Prior to Admission medications    Medication Dose, Route, Frequency   aspirin (ECOTRIN) 81 MG tablet 81 mg, Oral, Daily (standard)   atorvastatin (LIPITOR) 80 MG tablet 80 mg, Oral, Daily (standard)   buPROPion (WELLBUTRIN XL) 300 MG 24 hr tablet 300 mg, Oral, Daily (standard)   cetirizine (ZYRTEC) 10 MG tablet 20 mg, Oral, 2 times a day   clotrimazole (LOTRIMIN) 1 % cream Topical, 2 times a day (standard)   colchicine (COLCRYS) 0.6 mg tablet 0.6 mg, Oral, Once as needed   disulfiram (ANTABUSE) 250 mg tablet 500 mg, Daily (standard)  Patient not taking: Reported on 04/19/2022   empty container Misc Use as directed to dispose of Xolair syringes   EPINEPHrine (EPIPEN) 0.3 mg/0.3 mL injection 0.3 mg, Intramuscular, Once as needed   furosemide (LASIX) 20 MG tablet 20 mg, Oral   hydroCHLOROthiazide (HYDRODIURIL) 25 MG tablet 25 mg, Oral, 2 times a day (standard)   HYDROcodone-acetaminophen (NORCO) 5-325 mg per tablet 1 tablet, Oral, Every 8 hours PRN   hydrOXYzine (ATARAX) 25 MG tablet 25 mg, Oral, Every 8 hours PRN   LORazepam (ATIVAN) 2 MG tablet    metoprolol succinate (TOPROL-XL) 100 MG 24 hr tablet 100 mg, Oral, Daily (standard)   olmesartan-hydrochlorothiazide (BENICAR HCT) 40-25 mg per tablet 1 tablet, Oral, Daily (standard)   omalizumab (XOLAIR) 150 mg/mL syringe Inject the contents of 2 syringes (300 mg total) under the skin every twenty-eight (28) days.   omeprazole (PRILOSEC) 20 MG capsule 20 mg, Oral, Daily (standard)   tadalafiL (CIALIS) 5 MG tablet 5 mg, Oral, Daily PRN       Allergies  Daypro [oxaprozin], Hydrocodone bitartrate, Levofloxacin, and Seroquel [quetiapine]      Social History     Tobacco Use    Smoking status: Every Day     Current packs/day: 0.50     Average packs/day: 0.5 packs/day for 34.6 years (17.3 ttl pk-yrs)     Types: Cigarettes     Start date: 1990     Passive exposure: Past    Smokeless tobacco: Never   Substance Use Topics  Alcohol use: Yes     Alcohol/week: 2.0 standard drinks of alcohol     Types: 2 Cans of beer per week     Comment: 3-8 weekly    Drug use: Never       Review of Systems   Musculoskeletal:  Positive for joint pain.        Left knee pain left knee pain associated with walking movement.   Skin:         Redness swelling of the right knee skin and above and below this knee   All other systems reviewed and are negative.      As in HPI, all systems reviewed and otherwise negative.    Physical Exam      Vitals:    11/17/22 1227   BP: 118/77   Pulse: 73   Resp: 18   Temp: 36.6 ??C (97.9 ??F)   TempSrc: Temporal   SpO2: 97%   Weight: (!) 127.2 kg (280 lb 8 oz)   Height: 177.8 cm (5' 10)         Physical Exam   Constitutional: Patient appears well-developed and well nourished. Non toxic in appearance.  HEENT: Unremarkable.  Head: Atraumatic.   Eyes: Normal ocular movements.  Neck: Supple with normal range of motion.   Pulmonary/Chest: Effort normal. No respiratory distress.  Abdominal: Soft and non tender abdomen.  Musculoskeletal: Extremities atraumatic.  Positive diffuse edema ballottement of the right knee associated with increased warmth on palpation and tenderness on palpation of the right knee with associated erythema.  Neurological: Alert with no focal neurological deficit. Ambulatory with a steady gait.  Skin: Warm and dry.    Nursing note and vital signs reviewed.     ED Course   Procedure note     Sent for arthrocentesis of the right knee.  Patient has given consent to proceed.    Procedure note arthrocentesis of the right knee under sterile technique with sterile gloves and sterile dressing.  Fluid from the right knee sent for evaluation by lab total of 70 mL of yellow clear fluid was aspirated during arthrocentesis.  Tolerated the procedure well    Medications ordered during this encounter   Medications    clindamycin (CLEOCIN) 900 mg/50 mL IVPB 900 mg    doxycycline (VIBRA-TABS) tablet 100 mg        Medical Decision Making     I have reviewed the vital signs and the nursing notes. Labs and radiology results that were available during my care of the patient were independently reviewed by me and considered in my medical decision making.           Medical Decision Making       Differential Diagnosis: Septic arthritis, osteomyelitis, cellulitis of the right knee            Radiology     CT Lower Extremity Right W Wo Contrast    (Results Pending)       ED Clinical Impression     Final diagnoses:   None   Right knee pain    Procedures           This record has been created using [employer]. Chart creation errors have been sought, but may not always have been located. Such creation errors do not reflect on the standard of medical care.     America Brown, MD  11/17/22 1348       America Brown, MD  11/27/22 [phone]

## 2022-11-17 NOTE — Unmapped (Incomplete Revision)
[facility]   Emergency Department Provider Note         History     Chief Complaint  Insect Bite      HPI   Matthew Deleon is a 62 y.o. male presents with being bitten on the lateral superior aspect of the above the knee on the right side of the right knee.  Patient was bitten by a tick on the superior lateral aspect above the right knee and then started swelling of the right knee with pain with movement occurred along with redness of the area as well.  Have been having increasing pain and difficulty with walking because of the pain and discomfort and swelling and redness of the right knee and just above the knee and below the knee as well.        Past Medical History:   Diagnosis Date    Allergies     Arthritis     Asthma     GERD (gastroesophageal reflux disease)     Gout     Hyperlipidemia     Hypertension     Shortness of breath        Past Surgical History:   Procedure Laterality Date    KNEE SURGERY Bilateral 08/2011    right knee/meniscus    ROTATOR CUFF REPAIR  03/07/2018       Prior to Admission medications    Medication Dose, Route, Frequency   aspirin (ECOTRIN) 81 MG tablet 81 mg, Oral, Daily (standard)   atorvastatin (LIPITOR) 80 MG tablet 80 mg, Oral, Daily (standard)   buPROPion (WELLBUTRIN XL) 300 MG 24 hr tablet 300 mg, Oral, Daily (standard)   cetirizine (ZYRTEC) 10 MG tablet 20 mg, Oral, 2 times a day   clotrimazole (LOTRIMIN) 1 % cream Topical, 2 times a day (standard)   colchicine (COLCRYS) 0.6 mg tablet 0.6 mg, Oral, Once as needed   disulfiram (ANTABUSE) 250 mg tablet 500 mg, Daily (standard)  Patient not taking: Reported on 04/19/2022   empty container Misc Use as directed to dispose of Xolair syringes   EPINEPHrine (EPIPEN) 0.3 mg/0.3 mL injection 0.3 mg, Intramuscular, Once as needed   furosemide (LASIX) 20 MG tablet 20 mg, Oral   hydroCHLOROthiazide (HYDRODIURIL) 25 MG tablet 25 mg, Oral, 2 times a day (standard)   HYDROcodone-acetaminophen (NORCO) 5-325 mg per tablet 1 tablet, Oral, Every 8 hours PRN   hydrOXYzine (ATARAX) 25 MG tablet 25 mg, Oral, Every 8 hours PRN   LORazepam (ATIVAN) 2 MG tablet    metoprolol succinate (TOPROL-XL) 100 MG 24 hr tablet 100 mg, Oral, Daily (standard)   olmesartan-hydrochlorothiazide (BENICAR HCT) 40-25 mg per tablet 1 tablet, Oral, Daily (standard)   omalizumab (XOLAIR) 150 mg/mL syringe Inject the contents of 2 syringes (300 mg total) under the skin every twenty-eight (28) days.   omeprazole (PRILOSEC) 20 MG capsule 20 mg, Oral, Daily (standard)   tadalafiL (CIALIS) 5 MG tablet 5 mg, Oral, Daily PRN       Allergies  Daypro [oxaprozin], Hydrocodone bitartrate, Levofloxacin, and Seroquel [quetiapine]      Social History     Tobacco Use    Smoking status: Every Day     Current packs/day: 0.50     Average packs/day: 0.5 packs/day for 34.6 years (17.3 ttl pk-yrs)     Types: Cigarettes     Start date: 1990     Passive exposure: Past    Smokeless tobacco: Never   Substance Use Topics  Alcohol use: Yes     Alcohol/week: 2.0 standard drinks of alcohol     Types: 2 Cans of beer per week     Comment: 3-8 weekly    Drug use: Never       Review of Systems   Musculoskeletal:  Positive for joint pain.        Left knee pain left knee pain associated with walking movement.   Skin:         Redness swelling of the right knee skin and above and below this knee   All other systems reviewed and are negative.      As in HPI, all systems reviewed and otherwise negative.    Physical Exam      Vitals:    11/17/22 1227   BP: 118/77   Pulse: 73   Resp: 18   Temp: 36.6 ??C (97.9 ??F)   TempSrc: Temporal   SpO2: 97%   Weight: (!) 127.2 kg (280 lb 8 oz)   Height: 177.8 cm (5' 10)         Physical Exam   Constitutional: Patient appears well-developed and well nourished. Non toxic in appearance.  HEENT: Unremarkable.  Head: Atraumatic.   Eyes: Normal ocular movements.  Neck: Supple with normal range of motion.   Pulmonary/Chest: Effort normal. No respiratory distress.  Abdominal: Soft and non tender abdomen.  Musculoskeletal: Extremities atraumatic.  Positive diffuse edema ballottement of the right knee associated with increased warmth on palpation and tenderness on palpation of the right knee with associated erythema.  Neurological: Alert with no focal neurological deficit. Ambulatory with a steady gait.  Skin: Warm and dry.    Nursing note and vital signs reviewed.     ED Course   Procedure note     Sent for arthrocentesis of the right knee.  Patient has given consent to proceed.    Procedure note arthrocentesis of the right knee under sterile technique with sterile gloves and sterile dressing.  Fluid from the right knee sent for evaluation by lab total of 70 mL of yellow clear fluid was aspirated during arthrocentesis.  Tolerated the procedure well    Medications ordered during this encounter   Medications    clindamycin (CLEOCIN) 900 mg/50 mL IVPB 900 mg    doxycycline (VIBRA-TABS) tablet 100 mg        Medical Decision Making     I have reviewed the vital signs and the nursing notes. Labs and radiology results that were available during my care of the patient were independently reviewed by me and considered in my medical decision making.           MDM     Differential Diagnosis: Septic arthritis, osteomyelitis, cellulitis of the right knee            Radiology     CT Lower Extremity Right W Wo Contrast    (Results Pending)       ED Clinical Impression     Final diagnoses:   None       Procedures           This record has been created using [employer]. Chart creation errors have been sought, but may not always have been located. Such creation errors do not reflect on the standard of medical care.     America Brown, MD  11/17/22 [phone]

## 2022-11-17 NOTE — Unmapped (Signed)
Patient states he had a tick bite 7 days ago, now right night is swollen, red and painful.

## 2022-11-18 NOTE — Unmapped (Signed)
ED Progress Note    I have received sign out from Dr. Sherryll Burger. We have reviewed patient's presentation, history, physical, PMH, testing so far, treatment so far, and pending testing plus disposition plan.     ED Results  Results for orders placed or performed during the hospital encounter of 11/17/22   Joint Fluid Culture    Specimen: Joint, Left Knee; Fluid, Joint   Result Value Ref Range    Gram Stain Result Direct Specimen Gram Stain     Gram Stain Result 1+ Polymorphonuclear leukocytes     Gram Stain Result No organisms seen    Crystal Examination, Body Fluid   Result Value Ref Range    Crystal Analysis Crystals present (A) No crystals seen    Crystal Type       Monosodium urate crystals present  Calcium pyrophosphate crystals present   Body fluid cell count   Result Value Ref Range    Fluid Type Synovial Fluid     Color, Fluid Yellow no reference range established    Appearance, Fluid Opaque no reference range established    WBC, Fluid 17,470 (H) no reference range established ul    RBC, Fluid 2,000 (H) no reference range established ul    Nucleated Cells, Fluid 1,780 (H) no reference range established ul    MONONUCLEAR CELLS PERCENT, BODY FLUID 12.3 no reference range established %    PMN Cells Percent, Body Fluid 87.7 no reference range established %    Mononuclear Cells Absolute, Body Fluid 0.21 no reference range established 10*3/uL    PMN Cells Absolute, Body Fluid 1.53 (H) no reference range established 10*3/uL   Basic metabolic panel   Result Value Ref Range    Sodium 134 (L) 135 - 145 mmol/L    Potassium 3.6 3.5 - 5.0 mmol/L    Chloride 95 (L) 98 - 107 mmol/L    CO2 27.0 21.0 - 32.0 mmol/L    Anion Gap 12 (H) 3 - 11 mmol/L    BUN 21 (H) 8 - 20 mg/dL    Creatinine 1.61 (H) 0.80 - 1.30 mg/dL    BUN/Creatinine Ratio 15     eGFR CKD-EPI (2021) Male 56 (L) >=60 mL/min/1.28m2    Glucose 122 70 - 179 mg/dL    Calcium 8.8 8.5 - 09.6 mg/dL   Sedimentation rate, manual   Result Value Ref Range    Sed Rate 39 (H) 15 - 20 mm/h   CBC w/ Differential   Result Value Ref Range    WBC 10.2 4.0 - 10.5 10*9/L    RBC 4.65 4.10 - 5.60 10*12/L    HGB 14.8 12.5 - 17.0 g/dL    HCT 04.5 40.9 - 81.1 %    MCV 90.1 80.0 - 98.0 fL    MCH 31.8 27.0 - 34.0 pg    MCHC 35.3 32.0 - 36.0 g/dL    RDW 91.4 78.2 - 95.6 %    MPV 9.7 7.4 - 10.4 fL    Platelet 166 140 - 415 10*9/L    Neutrophils % 64.7 %    Lymphocytes % 20.1 %    Monocytes % 9.1 %    Eosinophils % 4.8 %    Basophils % 0.5 %    Absolute Neutrophils 6.6 1.8 - 7.8 10*9/L    Absolute Lymphocytes 2.0 0.7 - 4.5 10*9/L    Absolute Monocytes 0.9 0.1 - 1.0 10*9/L    Absolute Eosinophils 0.5 (H) 0.0 - 0.4 10*9/L    Absolute  Basophils 0.1 0.0 - 0.2 10*9/L     CT Lower Extremity Right W Contrast    Result Date: 11/17/2022  CLINICAL DATA:  Right knee pain and swelling following tick bite 7 days ago EXAM: CT OF THE LOWER RIGHT EXTREMITY WITH CONTRAST TECHNIQUE: Multidetector CT imaging of the lower right extremity was performed according to the standard protocol following intravenous contrast administration. RADIATION DOSE REDUCTION: This exam was performed according to the departmental dose-optimization program which includes automated exposure control, adjustment of the mA and/or kV according to patient size and/or use of iterative reconstruction technique. CONTRAST:  64 mL Omnipaque 350 IV contrast COMPARISON:  None Available. FINDINGS: Bones/Joint/Cartilage No acute fracture. No dislocation. No erosion or periosteal elevation. Tricompartmental osteoarthritis of the right knee, moderate-severe within the medial compartment with joint space narrowing, subchondral sclerosis/cystic change, and marginal osteophyte formation. Large knee joint effusion. No fluid-fluid or fat-fluid level. Small Baker's cyst. Ligaments Suboptimally assessed by CT. Muscles and Tendons Musculotendinous structures within normal limits by CT. Soft tissues Small amount of prepatellar edema/fluid without organized or rim enhancing collection.     1. No acute osseous abnormality of the right knee. 2. Tricompartmental osteoarthritis of the right knee, moderate-severe within the medial compartment. 3. Large knee joint effusion, nonspecific but may be degenerative/reactive. If there is high suspicion for septic arthritis, arthrocentesis and joint fluid analysis should be performed. 4. Small amount of prepatellar edema/fluid without organized collection to suggest septic bursitis. Electronically Signed   By: Duanne Guess D.O.   On: 11/17/2022 16:03           Medical Decision Making:    Awaiting on arthrocentesis fluid results.  See if greater than 50 K PMNs.  If less than 50 p.m. and may discharge home with outpatient follow-up  Just with teaching ambulatory referral to orthopedist.      Diagnosis:   1. Acute pain of right knee      Condition: Stable  Disposition: Pending    This chart has been completed using [employer] civil (consulting) software, and while attempts have been made to ensure accuracy, certain words and phrases may not be transcribed as intended.

## 2022-11-21 LAB — PERIPHERAL BLOOD SMEAR, PATH REVIEW: PATHOLOGIST SMEAR INTERPRETATION: ABNORMAL — AB

## 2022-11-22 NOTE — Unmapped (Signed)
[facility] Refill Coordination Note    Matthew Deleon, [place]: 1960-08-01  Phone: [phone] (home)       All above HIPAA information was verified with patient.         11/21/2022     4:07 PM   Specialty Rx Medication Refill Questionnaire   Which Medications would you like refilled and shipped? Xolair   Please list all current allergies: Daypro   Have you missed any doses in the last 30 days? No   Have you had any changes to your medication(s) since your last refill? No   How many days remaining of each medication do you have at home? None   If receiving an injectable medication, next injection date is 11/30/2022   Have you experienced any side effects in the last 30 days? No   Please enter the full address (street address, city, state, zip code) where you would like your medication(s) to be delivered to. [address] [place] [address]   Please specify on which day you would like your medication(s) to arrive. Note: if you need your medication(s) within 3 days, please call the pharmacy to schedule your order at [phone]  11/29/2022   Has your insurance changed since your last refill? No   Would you like a pharmacist to call you to discuss your medication(s)? No   Do you require a signature for your package? (Note: if we are billing Medicare Part B or your order contains a controlled substance, we will require a signature) No         Completed refill call assessment today to schedule patient's medication shipment from the [facility] [phone]).  All relevant notes have been reviewed.       Confirmed patient received a Conservation officer, historic buildings and a Surveyor, mining with first shipment. The patient will receive a drug information handout for each medication shipped and additional FDA Medication Guides as required.         REFERRAL TO PHARMACIST     Referral to the pharmacist: Not needed      [facility]     Shipping address confirmed in Epic.     Delivery Scheduled: Yes, Expected medication delivery date: 11/29/22.     Medication will be delivered via UPS to the prescription address in Epic WAM.    Reshma Hoey' W Danae Chen Shared [facility] Specialty Technician

## 2022-11-28 MED FILL — XOLAIR 150 MG/ML SUBCUTANEOUS SYRINGE: SUBCUTANEOUS | 28 days supply | Qty: 2 | Fill #8

## 2022-12-20 NOTE — Unmapped (Signed)
[facility] Refill Coordination Note    Virginia Harleman, [place]: May 17, 1960  Phone: [phone] (home)       All above HIPAA information was verified with patient.         12/19/2022    11:01 AM   Specialty Rx Medication Refill Questionnaire   Which Medications would you like refilled and shipped? Xolair   Please list all current allergies: Daypro   Have you missed any doses in the last 30 days? No   Have you had any changes to your medication(s) since your last refill? No   How many days remaining of each medication do you have at home? None   If receiving an injectable medication, next injection date is 12/29/2022   Have you experienced any side effects in the last 30 days? No   Please enter the full address (street address, city, state, zip code) where you would like your medication(s) to be delivered to. [address] [place] [address]   Please specify on which day you would like your medication(s) to arrive. Note: if you need your medication(s) within 3 days, please call the pharmacy to schedule your order at [phone]  12/29/2022   Has your insurance changed since your last refill? No   Would you like a pharmacist to call you to discuss your medication(s)? No   Do you require a signature for your package? (Note: if we are billing Medicare Part B or your order contains a controlled substance, we will require a signature) No         Completed refill call assessment today to schedule patient's medication shipment from the [facility] [phone]).  All relevant notes have been reviewed.       Confirmed patient received a Conservation officer, historic buildings and a Surveyor, mining with first shipment. The patient will receive a drug information handout for each medication shipped and additional FDA Medication Guides as required.         REFERRAL TO PHARMACIST     Referral to the pharmacist: Not needed      [facility]     Shipping address confirmed in Epic.     Delivery Scheduled: Yes, Expected medication delivery date: 12/28/22.     Medication will be delivered via UPS to the prescription address in Epic WAM.    Ani Deoliveira' W Danae Chen Shared [facility] Specialty Technician

## 2022-12-27 MED FILL — XOLAIR 150 MG/ML SUBCUTANEOUS SYRINGE: SUBCUTANEOUS | 28 days supply | Qty: 2 | Fill #9

## 2023-01-18 NOTE — Unmapped (Signed)
[facility] Delivery Pharm[facility] Clinical Assessment & Refill Coordination Note    Matthew Deleon, DOB: 02-13-1961  Phone: [phone] (home)     All above HIPAA information was verified with patient.     Was a Nurse, learning disability used for this call? No    Specialty Medication(s):   Inflammatory Disorders: Xolair     Current Outpatient Medications   Medication Sig Dispense Refill    aspirin (ECOTRIN) 81 MG tablet Take 1 tablet (81 mg total) by mouth daily.      atorvastatin (LIPITOR) 80 MG tablet Take 1 tablet (80 mg total) by mouth daily.  1    buPROPion (WELLBUTRIN XL) 300 MG 24 hr tablet Take 1 tablet (300 mg total) by mouth daily.  1    cetirizine (ZYRTEC) 10 MG tablet Take 2 tablets (20 mg total) by mouth two (2) times a day. 120 tablet 11    colchicine (COLCRYS) 0.6 mg tablet Take 1 tablet (0.6 mg total) by mouth once as needed.      disulfiram (ANTABUSE) 250 mg tablet Take 2 tablets (500 mg total) by mouth daily. (Patient not taking: Reported on 04/19/2022)      empty container Misc Use as directed to dispose of Xolair syringes 1 each 2    EPINEPHrine (EPIPEN) 0.3 mg/0.3 mL injection Inject 0.3 mL (0.3 mg total) into the muscle once as needed for anaphylaxis for up to 1 dose. 2 each 2    furosemide (LASIX) 20 MG tablet Take 1 tablet (20 mg total) by mouth.      hydroCHLOROthiazide (HYDRODIURIL) 25 MG tablet Take 1 tablet (25 mg total) by mouth two (2) times a day.  1    HYDROcodone-acetaminophen (NORCO) 5-325 mg per tablet Take 1 tablet by mouth every eight (8) hours as needed.      hydrOXYzine (ATARAX) 25 MG tablet Take 1 tablet (25 mg total) by mouth every eight (8) hours as needed for itching (hives). 90 tablet 3    LORazepam (ATIVAN) 2 MG tablet       metoprolol succinate (TOPROL-XL) 100 MG 24 hr tablet Take 1 tablet (100 mg total) by mouth daily.  1    olmesartan-hydrochlorothiazide (BENICAR HCT) 40-25 mg per tablet Take 1 tablet by mouth daily.      omalizumab (XOLAIR) 150 mg/mL syringe Inject the contents of 2 syringes (300 mg total) under the skin every twenty-eight (28) days. 2 mL 11    omeprazole (PRILOSEC) 20 MG capsule Take 1 capsule (20 mg total) by mouth daily.  3    tadalafiL (CIALIS) 5 MG tablet Take 1 tablet (5 mg total) by mouth daily as needed.       No current facility-administered medications for this visit.        Changes to medications: Fawaz reports no changes at this time.    Allergies   Allergen Reactions    Daypro [Oxaprozin] Anaphylaxis    Hydrocodone Bitartrate Hives    Levofloxacin Hives    Seroquel [Quetiapine]        Changes to allergies: No    SPECIALTY MEDICATION ADHERENCE     Xolair 150 mg/ml: 0 doses of medicine on hand     Medication Adherence    Patient reported X missed doses in the last month: 0  Specialty Medication: Xolair 150 mg/mL - inject 300mg  every 28 days  Patient is on additional specialty medications: No  Patient is on more than two specialty medications: No  Any gaps in refill  history greater than 2 weeks in the last 3 months: no  Demonstrates understanding of importance of adherence: yes  Informant: patient          Specialty medication(s) dose(s) confirmed: Regimen is correct and unchanged.     Are there any concerns with adherence? No    Adherence counseling provided? Not needed    CLINICAL MANAGEMENT AND INTERVENTION      Clinical Benefit Assessment:    Do you feel the medicine is effective or helping your condition? Yes    Clinical Benefit counseling provided? Not needed    Adverse Effects Assessment:    Are you experiencing any side effects? No    Are you experiencing difficulty administering your medicine? No    Quality of Life Assessment:    Quality of Life    Rheumatology  Oncology  Dermatology  Cystic Fibrosis          How many days over the past month did your chronic urticaria  keep you from your normal activities? For example, brushing your teeth or getting up in the morning. Patient declined to answer    Have you discussed this with your provider? Not needed    Acute Infection Status:    Acute infections noted within Epic:  No active infections  Patient reported infection: None    Therapy Appropriateness:    Is therapy appropriate based on current medication list, adverse reactions, adherence, clinical benefit and progress toward achieving therapeutic goals? Yes, therapy is appropriate and should be continued     DISEASE/MEDICATION-SPECIFIC INFORMATION      For patients on injectable medications: Patient currently has 0 doses left.  Next injection is scheduled for 10/3.    Chronic Inflammatory Diseases: Have you experienced any flares in the last month? No  Has this been reported to your provider? Not applicable    PATIENT SPECIFIC NEEDS     Does the patient have any physical, cognitive, or cultural barriers? No    Is the patient high risk? No    Did the patient require a clinical intervention? No    Does the patient require physician intervention or other additional services (i.e., nutrition, smoking cessation, social work)? No    SOCIAL DETERMINANTS OF HEALTH     At the [facility], we have learned that life circumstances - like trouble affording food, housing, utilities, or transportation can affect the health of many of our patients.   That is why we wanted to ask: are you currently experiencing any life circumstances that are negatively impacting your health and/or quality of life? Patient declined to answer    Social Determinants of Health     Food Insecurity: Not on file   Internet Connectivity: Not on file   Housing/Utilities: Not on file   Tobacco Use: High Risk (11/17/2022)    Patient History     Smoking Tobacco Use: Every Day     Smokeless Tobacco Use: Never     Passive Exposure: Past   Transportation Needs: Not on file   Alcohol Use: Not on file   Interpersonal Safety: Unknown (01/18/2023)    Interpersonal Safety     Unsafe Where You Currently Live: Not on file     Physically Hurt by Anyone: Not on file     Abused by Anyone: Not on file   Physical Activity: Not on file   Intimate Partner Violence: Not on file   Stress: Not on file   Substance Use: Not on file  Social Connections: Not on file   Financial Resource Strain: Not on file   Depression: Not at risk (09/09/2020)    PHQ-2     PHQ-2 Score: 0   Health Literacy: Low Risk  (09/13/2021)    Health Literacy     : Never       Would you be willing to receive help with any of the needs that you have identified today? Not applicable       SHIPPING     Specialty Medication(s) to be Shipped:   Inflammatory Disorders: Xolair    Other medication(s) to be shipped: No additional medications requested for fill at this time     Changes to insurance: No    Delivery Scheduled: Yes, Expected medication delivery date: 01/25/23.     Medication will be delivered via UPS to the confirmed prescription address in [facility].    The patient will receive a drug information handout for each medication shipped and additional FDA Medication Guides as required.  Verified that patient has previously received a Conservation officer, historic buildings and a Surveyor, mining.    The patient or caregiver noted above participated in the development of this care plan and knows that they can request review of or adjustments to the care plan at any time.      All of the patient's questions and concerns have been addressed.    [name], PharmD   [facility] Specialty Pharmacist

## 2023-01-25 MED FILL — XOLAIR 150 MG/ML SUBCUTANEOUS SYRINGE: SUBCUTANEOUS | 28 days supply | Qty: 2 | Fill #10

## 2023-02-14 NOTE — Unmapped (Signed)
[facility] Delivery Pharm[facility] Refill Coordination Note    Matthew Deleon, [place]: February 04, 1961  Phone: [phone] (home)       All above HIPAA information was verified with patient.         02/13/2023     3:46 PM   Specialty Rx Medication Refill Questionnaire   Which Medications would you like refilled and shipped? Xolair 150 mg.  None on hand.   Please list all current allergies: Daypro   Have you missed any doses in the last 30 days? No   Have you had any changes to your medication(s) since your last refill? No   How many days remaining of each medication do you have at home? None   If receiving an injectable medication, next injection date is 02/20/2023   Have you experienced any side effects in the last 30 days? No   Please enter the full address (street address, city, state, zip code) where you would like your medication(s) to be delivered to. [address] [place] [address]   Please specify on which day you would like your medication(s) to arrive. Note: if you need your medication(s) within 3 days, please call the pharmacy to schedule your order at [phone]  02/20/2023   Has your insurance changed since your last refill? No   Would you like a pharmacist to call you to discuss your medication(s)? No   Do you require a signature for your package? (Note: if we are billing Medicare Part B or your order contains a controlled substance, we will require a signature) No   Additional Comments: Thank you         Completed refill call assessment today to schedule patient's medication shipment from the [facility] [phone]).  All relevant notes have been reviewed.       Confirmed patient received a Conservation officer, historic buildings and a Surveyor, mining with first shipment. The patient will receive a drug information handout for each medication shipped and additional FDA Medication Guides as required.         REFERRAL TO PHARMACIST     Referral to the pharmacist: Not needed      [facility]     Shipping address confirmed in Epic.     Delivery Scheduled: Yes, Expected medication delivery date: 02/20/23.     Medication will be delivered via UPS to the prescription address in Epic WAM.    Reine Bristow' W Danae Chen [facility] Specialty Technician

## 2023-02-20 ENCOUNTER — Ambulatory Visit: Admit: 2023-02-20 | Discharge: 2023-02-21 | Payer: PRIVATE HEALTH INSURANCE

## 2023-02-20 DIAGNOSIS — L501 Idiopathic urticaria: Principal | ICD-10-CM

## 2023-02-20 MED ORDER — FLUTICASONE PROPIONATE 50 MCG/ACTUATION NASAL SPRAY,SUSPENSION
Freq: Two times a day (BID) | NASAL | 11 refills | 30 days | Status: CP
Start: 2023-02-20 — End: 2024-02-20

## 2023-02-20 MED ORDER — AZELASTINE 137 MCG (0.1 %) NASAL SPRAY
Freq: Two times a day (BID) | NASAL | 11 refills | 55 days | Status: CP
Start: 2023-02-20 — End: 2024-02-20

## 2023-02-20 MED ORDER — XOLAIR 300 MG/2 ML SUBCUTANEOUS AUTO-INJECTOR
SUBCUTANEOUS | 11 refills | 0 days | Status: CP
Start: 2023-02-20 — End: ?
  Filled 2023-02-19: qty 2, 28d supply, fill #11
  Filled 2023-03-26: qty 2, 28d supply, fill #0

## 2023-02-20 NOTE — Unmapped (Signed)
-   Start sinus rinses twice daily followed by flonase and azelastine 1-2 sprays to each nostril twice daily  - continue cetirizine 1 tablet daily

## 2023-02-20 NOTE — Unmapped (Signed)
[facility]  [address]  [address], [address]  [place], [place] [address]    Assessment and Plan:   Matthew Deleon is a 62 y.o. male that was seen in follow-up for the evaluation:    Chronic urticaria  -Now well-controlled  -Continue Xolair 300 mg every 28 days.   -Continue Zyrtec 1 tablet daily.  Since his drowsiness significantly improved, we will try to limit use of cetirizine since he is now well-controlled with Xolair and previously failed high-dose antihistamine therapy up to 4 tablets of Zyrtec in 24 hours.    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  However, also discussed lower airway inflammation as well.  We will first focus on treating upper airway inflammation with nasal saline irrigations twice daily followed by Flonase and azelastine 2 sprays each nostril twice daily.  -At the next visit, I would like him to undergo spirometry and a trial of ICS/LABA.  Given his smoking history, would also consider COPD and emphysema as well.    Orders Placed This Encounter   Procedures    INFLUENZA VACCINE IIV3(IM)(PF)6 MOS UP     Return in about 3 months (around 05/23/2023) for Next scheduled follow up.    I personally spent over 30 minutes face-to-face and non-face-to-face in the care of this patient, which includes all pre, intra, and post visit time on the date of service.  All documented time was specific to the E/M visit and does not include any procedures that may have been performed.    Danton Sewer, M.D.   Division of Allergy and Immunology    Subjective   Last Visit: 11/14/22    HISTORY OF PRESENT ILLNESS:  Matthew Deleon is a 62 y.o. male who presented for follow-up of chronic urticaria and angioedema. History is obtained from Mr. Fee. A thorough review of the medical records was also performed.    Interval history: He remains on Xolair 300 mg every 4 weeks.  This was initiated in December 2023.  He now feels that he has had an excellent response with Xolair with minimal breakthrough symptoms of pruritus and urticaria.  At the last visit, he did feel that he had significant drowsiness associated with high doses of antihistamine.  So, following our discussion after the last visit he did opt to taper his cetirizine.  He has been able to taper to 1 tablet daily and continues to maintain excellent control of pruritus and urticaria.  Of note, he is currently receiving to 150 mg injections of Xolair and would like to switch to the 300 mg autoinjector.    He also tells me that he has had increasing amounts of congestion, postnasal drainage, throat clearing and productive cough.  He does smoke approximately half pack of cigarettes per day.  He does have an appointment with his primary care physician to discuss smoking cessation as he is amenable to this.  He does intermittently experience some chest tightness but has no prior history of asthma or need for albuterol.  He is currently working with a fair amount of drywall at work and is not utilizing a mask so does feel that he has increase exposures related to work as well.      Past Medical History:     Past Medical History:   Diagnosis Date  Allergies     Arthritis     Asthma     GERD (gastroesophageal reflux disease)     Gout     Hyperlipidemia     Hypertension     Shortness of breath      Medications:     Current Outpatient Medications   Medication Sig Dispense Refill    aspirin (ECOTRIN) 81 MG tablet Take 1 tablet (81 mg total) by mouth daily.      atorvastatin (LIPITOR) 80 MG tablet Take 1 tablet (80 mg total) by mouth daily.  1    azelastine (ASTELIN) 137 mcg (0.1 %) nasal spray 2 sprays into each nostril two (2) times a day. Use in each nostril as directed 30 mL 11    buPROPion (WELLBUTRIN XL) 300 MG 24 hr tablet Take 1 tablet (300 mg total) by mouth daily.  1    cetirizine (ZYRTEC) 10 MG tablet Take 2 tablets (20 mg total) by mouth two (2) times a day. 120 tablet 11    colchicine (COLCRYS) 0.6 mg tablet Take 1 tablet (0.6 mg total) by mouth once as needed.      disulfiram (ANTABUSE) 250 mg tablet Take 2 tablets (500 mg total) by mouth daily. (Patient not taking: Reported on 04/19/2022)      empty container Misc Use as directed to dispose of Xolair syringes 1 each 2    EPINEPHrine (EPIPEN) 0.3 mg/0.3 mL injection Inject 0.3 mL (0.3 mg total) into the muscle once as needed for anaphylaxis for up to 1 dose. 2 each 2    fluticasone propionate (FLONASE) 50 mcg/actuation nasal spray 2 sprays into each nostril two (2) times a day. 16 g 11    furosemide (LASIX) 20 MG tablet Take 1 tablet (20 mg total) by mouth.      hydroCHLOROthiazide (HYDRODIURIL) 25 MG tablet Take 1 tablet (25 mg total) by mouth two (2) times a day.  1    HYDROcodone-acetaminophen (NORCO) 5-325 mg per tablet Take 1 tablet by mouth every eight (8) hours as needed.      hydrOXYzine (ATARAX) 25 MG tablet Take 1 tablet (25 mg total) by mouth every eight (8) hours as needed for itching (hives). 90 tablet 3    LORazepam (ATIVAN) 2 MG tablet       metoprolol succinate (TOPROL-XL) 100 MG 24 hr tablet Take 1 tablet (100 mg total) by mouth daily.  1    olmesartan-hydrochlorothiazide (BENICAR HCT) 40-25 mg per tablet Take 1 tablet by mouth daily.      omalizumab (XOLAIR) 300 mg/2 mL auto-injector Inject 300 mg under the skin every twenty-eight (28) days. 2 mL 11    omeprazole (PRILOSEC) 20 MG capsule Take 1 capsule (20 mg total) by mouth daily.  3    tadalafiL (CIALIS) 5 MG tablet Take 1 tablet (5 mg total) by mouth daily as needed.       No current facility-administered medications for this visit.     Allergies:     Allergies   Allergen Reactions    Daypro [Oxaprozin] Anaphylaxis    Hydrocodone Bitartrate Hives    Levofloxacin Hives    Seroquel [Quetiapine]      Objective:   PHYSICAL EXAM:  Vitals: BP 121/73 (BP Site: L Arm, BP Position: Sitting)  - Pulse 63  - SpO2 94%   Constitutional: Sitting comfortably in no acute distress.  Skin: No longer has annular and scaling lesions with some degree of central clearing located on his upper chest.  Does have a few small hives present on scalp.   Ext: Warm and well perfused without cyanosis, clubbing, or edema.  Neuro: Awake, alert and oriented to person, place, and time.  Psych: Normal affect.

## 2023-02-22 DIAGNOSIS — L501 Idiopathic urticaria: Principal | ICD-10-CM

## 2023-02-22 NOTE — Unmapped (Signed)
Clinic received PA request for Azelastine/Fluticasone combo nasal spray, however Rx's that were sent were for each individual drug.  Called pharmacy to clarify.  Pharmacy stated they had 2 different Rx and the individual meds went through with no charge.  No PA needed.

## 2023-03-13 ENCOUNTER — Ambulatory Visit
Admit: 2023-03-13 | Discharge: 2023-03-13 | Disposition: A | Discharge status: Home / Self Care | Payer: BLUE CROSS/BLUE SHIELD

## 2023-03-13 DIAGNOSIS — M109 Gout, unspecified: Principal | ICD-10-CM

## 2023-03-13 MED ORDER — COLCHICINE 0.6 MG TABLET
ORAL_TABLET | Freq: Every day | ORAL | 0 refills | 7 days | Status: CP
Start: 2023-03-13 — End: 2023-03-20

## 2023-03-13 MED ORDER — TRAMADOL 50 MG TABLET
ORAL_TABLET | Freq: Four times a day (QID) | ORAL | 0 refills | 3 days | Status: CP | PRN
Start: 2023-03-13 — End: 2023-03-18

## 2023-03-13 MED ORDER — METHYLPREDNISOLONE 4 MG TABLETS IN A DOSE PACK
0 refills | 0 days | Status: CP
Start: 2023-03-13 — End: ?

## 2023-03-13 MED ADMIN — colchicine (COLCRYS) tablet 0.6 mg: .6 mg | ORAL | @ 13:00:00 | Stop: 2023-03-13

## 2023-03-13 MED ADMIN — traMADol (ULTRAM) tablet 50 mg: 50 mg | ORAL | @ 13:00:00 | Stop: 2023-03-13

## 2023-03-13 MED ADMIN — predniSONE (DELTASONE) tablet 60 mg: 60 mg | ORAL | @ 13:00:00 | Stop: 2023-03-13

## 2023-03-13 NOTE — Unmapped (Signed)
Patient here for evaluation of bilateral foot pain, worsening in the right, for the past few days. Patient states he has a longstanding history with gout and was recently prescribed a new medication as treatment but has been unable to get this medication. Patient reports that he is not a diabetic

## 2023-03-13 NOTE — Unmapped (Signed)
Emergency Department Provider Note        ED Clinical Impression     Final diagnoses:   Acute gouty arthritis (Primary)       ED Assessment/Plan       Condition: Stable  Disposition: Discharge    This chart has been completed using Dragon Medical Dictation software, and while attempts have been made to ensure accuracy, certain words and phrases may not be transcribed as intended.     History     Chief Complaint   Patient presents with    Gout    Foot Pain     HPI    Matthew Deleon is a 62 y.o. male  who presents today to the  emergency department complaining of right foot pain secondary to gout.  Patient states the gout flareup.  He states he has been drinking some beer lately.  He does take medication but he states that his pain got worse over the past few days so he came for help.  He denies any trauma.  Describes his symptoms as moderate.  Patient notes that he has some slight pain in the left foot pain that is not significant.        Allergies: is allergic to daypro [oxaprozin], hydrocodone bitartrate, levofloxacin, and seroquel [quetiapine].  Medications: has a current medication list which includes the following long-term medication(s): aspirin, atorvastatin, azelastine, epinephrine, fluticasone propionate, furosemide, hydrochlorothiazide, metoprolol succinate, and olmesartan-hydrochlorothiazide.  PMHx:  has a past medical history of Allergies, Arthritis, Asthma, GERD (gastroesophageal reflux disease), Gout, Hyperlipidemia, Hypertension, and Shortness of breath.  PSHx:  has a past surgical history that includes Knee surgery (Bilateral, 08/2011) and Rotator cuff repair (03/07/2018).  SocHx:  reports that he has been smoking cigarettes. He started smoking about 34 years ago. He has a 17.4 pack-year smoking history. He has been exposed to tobacco smoke. He has never used smokeless tobacco. He reports current alcohol use of about 2.0 standard drinks of alcohol per week. He reports that he does not use drugs.  Allergies, Medications, Medical, Surgical, and Social History were reviewed as documented above.      Social Drivers of Health with Concerns     Food Insecurity: Not on file   Internet Connectivity: Not on file   Housing/Utilities: Not on file   Tobacco Use: High Risk (11/17/2022)    Patient History     Smoking Tobacco Use: Every Day     Smokeless Tobacco Use: Never     Passive Exposure: Past   Transportation Needs: Not on file   Alcohol Use: Not on file   Interpersonal Safety: Not on file   Physical Activity: Not on file   Intimate Partner Violence: Not on file   Stress: Not on file   Substance Use: Not on file (02/28/2023)   Social Connections: Not on file   Financial Resource Strain: Not on file         Review Of Systems    Review of Systems   Constitutional:  Negative for fever.   HENT:  Negative for congestion.    Respiratory:  Negative for chest tightness and shortness of breath.    Cardiovascular:  Negative for chest pain.   Gastrointestinal:  Negative for abdominal pain.   Musculoskeletal:         Right foot pain   Skin:  Negative for color change.   Psychiatric/Behavioral:  Negative for behavioral problems.    All other systems reviewed and are negative.  Physical Exam     BP 127/94  - Pulse 80  - Temp 36.8 ??C (98.2 ??F) (Oral)  - Resp 18  - Ht 172.7 cm (5' 8)  - Wt (!) 125.7 kg (277 lb 3.2 oz)  - SpO2 96%  - BMI 42.15 kg/m??     Physical Exam  Vitals and nursing note reviewed.   Constitutional:       General: He is not in acute distress.  HENT:      Head: Normocephalic.   Eyes:      Conjunctiva/sclera: Conjunctivae normal.   Cardiovascular:      Rate and Rhythm: Regular rhythm.      Pulses: Normal pulses.      Heart sounds: Normal heart sounds.   Pulmonary:      Effort: No respiratory distress.      Breath sounds: Normal breath sounds.   Abdominal:      General: There is no distension.   Musculoskeletal: General: No deformity.      Comments: Tenderness of the right big toe and right pinky toe.  No warmth or erythema to suggest infection.   Skin:     General: Skin is warm.      Capillary Refill: Capillary refill takes 2 to 3 seconds.      Comments: Normal cap refill.   Neurological:      General: No focal deficit present.   Psychiatric:         Mood and Affect: Mood normal.         ED Course   Medical Decision Making  Clinical picture is consistent with gouty arthritis.Marland Kitchen    8:23 AM  Patient reevaluated.  Patient is doing better.  He stable for discharge    I have reviewed my clinical findings  and my clinical impression with the patient. The patient has expressed understanding that at this time there is no evidence for a more malignant underlying process, but the patient also understands that early in the process of a condition such as this, an initial workup can be falsely reassuring. I have counseled the patient and discussed follow-up with the patient, stressing the importance of appropriate follow-up. I have also counseled the patient to return if worse or any concerns. Routine discharge counseling was given to the patient and the patient understands that worsening, changing or persistent symptoms should prompt an immediate call or follow up with their primary physician or return to the emergency department for reevaluation. Patient has expressed understanding.          Risk  Prescription drug management.  Decision regarding hospitalization.         Procedures     No results found for this visit on 03/13/23 (from the past 4464 hours).      ED Results  No results found for any visits on 03/13/23.  No results found.    Medications Administered:   Medications   colchicine (COLCRYS) tablet 0.6 mg (0.6 mg Oral Given 03/13/23 0755)   predniSONE (DELTASONE) tablet 60 mg (60 mg Oral Given 03/13/23 0754)   traMADol (ULTRAM) tablet 50 mg (50 mg Oral Given 03/13/23 0755)       Discharge Medications (Medications Prescribed during this  ED visit and Patient's Home Medications) :      Your Medication List        START taking these medications      methylPREDNISolone 4 mg tablet  Commonly known as: MEDROL DOSEPACK  follow package directions  traMADol 50 mg tablet  Commonly known as: ULTRAM  Take 1 tablet (50 mg total) by mouth every six (6) hours as needed for pain for up to 5 days.            CHANGE how you take these medications      colchicine 0.6 mg tablet  Commonly known as: COLCRYS  Take 1 tablet (0.6 mg total) by mouth once as needed.  What changed: Another medication with the same name was added. Make sure you understand how and when to take each.     colchicine 0.6 mg tablet  Commonly known as: COLCRYS  Take 1 tablet (0.6 mg total) by mouth daily for 7 days.  What changed: You were already taking a medication with the same name, and this prescription was added. Make sure you understand how and when to take each.            ASK your doctor about these medications      aspirin 81 MG tablet  Commonly known as: ECOTRIN  Take 1 tablet (81 mg total) by mouth daily.     atorvastatin 80 MG tablet  Commonly known as: LIPITOR  Take 1 tablet (80 mg total) by mouth daily.     azelastine 137 mcg (0.1 %) nasal spray  Commonly known as: ASTELIN  2 sprays into each nostril two (2) times a day. Use in each nostril as directed     buPROPion 300 MG 24 hr tablet  Commonly known as: Wellbutrin XL  Take 1 tablet (300 mg total) by mouth daily.     cetirizine 10 MG tablet  Commonly known as: ZYRTEC  Take 2 tablets (20 mg total) by mouth two (2) times a day.     disulfiram 250 mg tablet  Commonly known as: ANTABUSE  Take 2 tablets (500 mg total) by mouth daily.     empty container Misc  Use as directed to dispose of Xolair syringes     EPINEPHrine 0.3 mg/0.3 mL injection  Commonly known as: EPIPEN  Inject 0.3 mL (0.3 mg total) into the muscle once as needed for anaphylaxis for up to 1 dose.     fluticasone propionate 50 mcg/actuation nasal spray  Commonly known as: FLONASE  2 sprays into each nostril two (2) times a day.     furosemide 20 MG tablet  Commonly known as: LASIX  Take 1 tablet (20 mg total) by mouth.     hydroCHLOROthiazide 25 MG tablet  Commonly known as: HYDRODIURIL  Take 1 tablet (25 mg total) by mouth two (2) times a day.     HYDROcodone-acetaminophen 5-325 mg per tablet  Commonly known as: NORCO  Take 1 tablet by mouth every eight (8) hours as needed.     hydrOXYzine 25 MG tablet  Commonly known as: ATARAX  Take 1 tablet (25 mg total) by mouth every eight (8) hours as needed for itching (hives).     LORazepam 2 MG tablet  Commonly known as: ATIVAN     metoPROLOL succinate 100 MG 24 hr tablet  Commonly known as: Toprol-XL  Take 1 tablet (100 mg total) by mouth daily.     olmesartan-hydroCHLOROthiazide 40-25 mg per tablet  Commonly known as: BENICAR HCT  Take 1 tablet by mouth daily.     omeprazole 20 MG capsule  Commonly known as: PriLOSEC  Take 1 capsule (20 mg total) by mouth daily.     tadalafil 5 MG tablet  Commonly known as:  CIALIS  Take 1 tablet (5 mg total) by mouth daily as needed.     XOLAIR 300 mg/2 mL auto-injector  Generic drug: omalizumab  Inject the contents of 1 pen (300 mg) under the skin every twenty-eight (28) days.                 Bernerd Pho, MD  03/13/23 [phone]

## 2023-03-19 NOTE — Unmapped (Signed)
[facility] Clinical Intervention    Type of intervention: Medication administration    Medication involved: Xolair    Problem identified: We received a new prescription for Xolair 300mg  pen instead of Xolair 150mg  prefilled syringes. Dose remains the same of 300mg  every 28 days.    Intervention performed: We discussed administration differences of using autoinjector pen versus prefilled syringe (see notes below). We also discussed only administering one injection given Xolair pen is 300mg  instead of 150mg . He verbalized understanding and all questions were answered.     Follow-up needed: n/a    Approximate time spent: 5-10 minutes    Clinical evidence used to support intervention: Drug information resource    Result of the intervention:  Medication Optimization     [name], PharmD   [facility] Specialty Pharmacist      Xolair Auto-injector Pen:  Before you use Xolair autoinjector for the first time, make sure your healthcare provider shows you the right way to use it  Gather supplies - Xolair autoinjector, alcohol swab, sterile cotton ball or gauze, small bandage and sharps disposal container   Look at the medication label - look for correct medication, correct dose and check the expiration date. Take note whether your dose requires more than 1 injection   Take the carton out of the refrigerator then lay on the flat surface and allow it to warm up to room temperature for at least 30-45 minutes   Prepare injection site - wash your hands with soap and water and remove the autoinjector out of the cartoon by holding the middle part, being sure not turn the carton upside down, hold the autoinjector by the cap or remove the cap before you are ready to inject    Look at the medication - the liquid in the viewing window should appear clear and colorless to pale brownish-yellow  Choose where to inject - if you are giving yourself the injection: front of the thigh or stomach area (abdomen); if a caregiver is giving the injection: additional option of the outer area of the upper arm. Do not inject within 2-inches directly around your belly button   Clean the injection site - wipe site with alcohol swab in a circular motion and let it dry for 10 seconds, do not touch injection site again before giving injection   Remove the cap - pull cap straight off and throw away in a regular household trash. Do not clean or touch the needle guard   Give the injection - hold the autoinjector at a 90-degree angle and press straight down while holding the autoinjector firmly against the skin   Monitor the injection - keep the autoinjector in the skin when you hear the 1st click (indicates the injection has started) and monitor by watching the green indictor within the viewing window   Complete injection - listen for the 2nd click (indicates the injection is almost complete) and continue holding the autoinjector in the same position until the green indicator has stopped moving and completing fills the viewing window   Remove - lift the autoinjector straight from the skin and the needle guard will automatically extend and lock over the needle   Disposal - place the used autoinjectors in an FDA sharps disposal or use an appropriate household container (e.g closed and puncture resistant)   Care for the injection - if you see any blood at the injection site, press a cotton ball or gauze  on the site and maintain pressure until the bleeding stops, do not rub the injection site  If your prescribed dose requires more than 1 injection - repeat the steps above for the next injection using a new autoinjector, make sure each injection is at least 1 inch apart from each other

## 2023-03-19 NOTE — Unmapped (Signed)
[facility] Refill Coordination Note    Specialty Medication(s) to be Shipped:   Inflammatory Disorders: Xolair    Other medication(s) to be shipped: No additional medications requested for fill at this time     Matthew Deleon, DOB: 04-Jan-1961  Phone: [phone] (home)       All above HIPAA information was verified with patient.     Was a Nurse, learning disability used for this call? No    Completed refill call assessment today to schedule patient's medication shipment from the [facility]  [phone]).  All relevant notes have been reviewed.     Specialty medication(s) and dose(s) confirmed: Regimen is correct and unchanged.   Changes to medications: [name] reports starting the following medications: febuxostat  Changes to insurance: No  New side effects reported not previously addressed with a pharmacist or physician: None reported  Questions for the pharmacist: No    Confirmed patient received a Conservation officer, historic buildings and a Surveyor, mining with first shipment. The patient will receive a drug information handout for each medication shipped and additional FDA Medication Guides as required.       DISEASE/MEDICATION-SPECIFIC INFORMATION        For patients on injectable medications: Patient currently has 0 doses left.  Next injection is scheduled for 11/29 but will take once received on 12/3.    SPECIALTY MEDICATION ADHERENCE     Medication Adherence    Patient reported X missed doses in the last month: 0  Specialty Medication: Xolair 150 mg/mL - injet 300mg  Q28d  Patient is on additional specialty medications: No  Patient is on more than two specialty medications: No  Any gaps in refill history greater than 2 weeks in the last 3 months: no  Demonstrates understanding of importance of adherence: yes  Informant: patient          Were doses missed due to medication being on hold? No    Xolair 300  mg/29mL : 0 days of medicine on hand     REFERRAL TO PHARMACIST Referral to the pharmacist: Not needed      [facility]     Shipping address confirmed in Epic.       Delivery Scheduled: Yes, Expected medication delivery date: 03/27/23.     Medication will be delivered via UPS to the prescription address in Epic WAM.    [name], PharmD   [facility]  Specialty Pharmacist

## 2023-04-11 NOTE — Unmapped (Signed)
[facility] Refill Coordination Note    Matthew Deleon, [place]: [birth year]  Phone: [phone] (home)       All above HIPAA information was verified with patient.         04/11/2023    10:56 AM   Specialty Rx Medication Refill Questionnaire   Which Medications would you like refilled and shipped? Xolair.  None on hand.   Please list all current allergies: Daypro   Have you missed any doses in the last 30 days? No   Have you had any changes to your medication(s) since your last refill? No   How many days remaining of each medication do you have at home? None   If receiving an injectable medication, next injection date is 04/24/2023   Have you experienced any side effects in the last 30 days? No   Please enter the full address (street address, city, state, zip code) where you would like your medication(s) to be delivered to. [address] [place] [address]   Please specify on which day you would like your medication(s) to arrive. Note: if you need your medication(s) within 3 days, please call the pharmacy to schedule your order at [phone]  04/20/2023   Has your insurance changed since your last refill? No   Would you like a pharmacist to call you to discuss your medication(s)? No   Do you require a signature for your package? (Note: if we are billing Medicare Part B or your order contains a controlled substance, we will require a signature) No   Additional Comments: Thank you, Merry Christmas.         Completed refill call assessment today to schedule patient's medication shipment from the [facility] [phone]).  All relevant notes have been reviewed.       Confirmed patient received a Conservation officer, historic buildings and a Surveyor, mining with first shipment. The patient will receive a drug information handout for each medication shipped and additional FDA Medication Guides as required.         REFERRAL TO PHARMACIST     Referral to the pharmacist: Not needed      [facility]     Shipping address confirmed in Epic.     Delivery Scheduled: Yes, Expected medication delivery date: 04/20/23.     Medication will be delivered via UPS to the prescription address in Epic WAM.    Ricci Barker   [facility] Specialty Technician

## 2023-04-19 MED FILL — XOLAIR 300 MG/2 ML SUBCUTANEOUS AUTO-INJECTOR: SUBCUTANEOUS | 28 days supply | Qty: 2 | Fill #1

## 2023-05-10 NOTE — Unmapped (Addendum)
[facility] Refill Coordination Note    Matthew Deleon, Matthew Deleon: 1960-09-23  Phone: [phone] (home)       All above HIPAA information was verified with patient.         05/08/2023    12:41 PM   Specialty Rx Medication Refill Questionnaire   Which Medications would you like refilled and shipped? Xolair 300 mg   Please list all current allergies: Daypro   Have you missed any doses in the last 30 days? No   Have you had any changes to your medication(s) since your last refill? No   How many days remaining of each medication do you have at home? None   If receiving an injectable medication, next injection date is 05/18/2023   Have you experienced any side effects in the last 30 days? No   Please enter the full address (street address, city, state, zip code) where you would like your medication(s) to be delivered to. [address] [place] [address]   Please specify on which day you would like your medication(s) to arrive. Note: if you need your medication(s) within 3 days, please call the pharmacy to schedule your order at [phone]  05/18/2023   Has your insurance changed since your last refill? No   Would you like a pharmacist to call you to discuss your medication(s)? No   Do you require a signature for your package? (Note: if we are billing Medicare Part B or your order contains a controlled substance, we will require a signature) No   Additional Comments: Thank you         Completed refill call assessment today to schedule patient's medication shipment from the [facility] [phone]).  All relevant notes have been reviewed.       Confirmed patient received a Conservation officer, historic buildings and a Surveyor, mining with first shipment. The patient will receive a drug information handout for each medication shipped and additional FDA Medication Guides as required.         REFERRAL TO PHARMACIST     Referral to the pharmacist: Not needed      [facility] Shipping address confirmed in Epic.     Delivery Scheduled: Yes, Expected medication delivery date: 05/18/2023.     Medication will be delivered via UPS to the prescription address in Epic WAM.    Matthew Deleon   [facility] Specialty Technician

## 2023-05-17 MED FILL — XOLAIR 300 MG/2 ML SUBCUTANEOUS AUTO-INJECTOR: SUBCUTANEOUS | 28 days supply | Qty: 2 | Fill #2

## 2023-06-05 ENCOUNTER — Ambulatory Visit: Admit: 2023-06-05 | Discharge: 2023-06-06 | Payer: BLUE CROSS/BLUE SHIELD

## 2023-06-05 DIAGNOSIS — J449 Chronic obstructive pulmonary disease, unspecified: Principal | ICD-10-CM

## 2023-06-05 MED ORDER — BUDESONIDE-FORMOTEROL HFA 160 MCG-4.5 MCG/ACTUATION AEROSOL INHALER
Freq: Two times a day (BID) | RESPIRATORY_TRACT | 11 refills | 31.00 days | Status: CP
Start: 2023-06-05 — End: 2023-06-05

## 2023-06-05 MED ORDER — ALBUTEROL SULFATE HFA 90 MCG/ACTUATION AEROSOL INHALER
RESPIRATORY_TRACT | 10 refills | 0.00 days | Status: CP | PRN
Start: 2023-06-05 — End: ?

## 2023-06-05 NOTE — Unmapped (Signed)
-   Continue Xolair every 4 weeks  - Continue cetirizine 1 pill daily; OK to take an additional tablet on days that you have breakthrough hives  - Continue sinus rinses and Azelastine 2 sprays to each nostril twice daily  - For cough and COPD, I sent a referral to the Pulmonology clinic.   - Recommend using albuterol as needed until your PCP or Pulmonologist makes additional recommendations.

## 2023-06-05 NOTE — Unmapped (Signed)
[facility]  [address]  [address] Floor, [address]  [place], [place] [address]    Assessment and Plan:   Matthew Deleon is a 63 y.o. male that was seen in follow-up for the evaluation:    Chronic urticaria  -Now well-controlled  -Continue Xolair 300 mg every 28 days.   -Continue Zyrtec 1 tablet daily.  I do recommend an additional tablet on days with breakthrough symptoms.  He would like to minimize how much antihistamines that he utilizes secondary to drowsiness but is amenable to utilizing an additional tablet just on days with breakthrough symptoms.    Chronic rhinitis  -This is now well-controlled with nasal saline irrigations twice daily and azelastine 2 sprays each nostril twice daily.  He does find Flonase quite irritating to discontinue this but symptoms have remained well-controlled without it.  -Since he is now on Xolair, allergy testing would not be valid at this time.  However, we discussed that his symptoms are likely nonallergic in nature and related to chronic smoke exposure.    Chronic cough, suspected COPD with lack of reversibility noted on spirometry with short acting bronchodilator:  -Given noted obstruction on exam, dyspnea with exertion, chronic cough, and intermittent episodes of chest tightness and wheezing, I do recommend evaluation by pulmonology for treatment of COPD and/or airway clearance measures.  -Counseled on smoking cessation so he will discuss this with his PCP  -We did symptomatically improved with a short acting bronchodilator during clinic today, so prescription sent until he can be further evaluated by pulmonology.    Orders Placed This Encounter   Procedures    Ambulatory referral to Pulmonology     Return in about 6 months (around 12/03/2023) for Next scheduled follow up.    I personally spent over 45 minutes face-to-face and non-face-to-face in the care of this patient, which includes all pre, intra, and post visit time on the date of service.  All documented time was specific to the E/M visit and does not include any procedures that may have been performed.    Danton Sewer, M.D.   Division of Allergy and Immunology    Subjective   Last Visit: 02/20/23    HISTORY OF PRESENT ILLNESS:  Matthew Deleon is a 63 y.o. male who presented for follow-up of chronic urticaria and angioedema. History is obtained from Matthew Deleon. A thorough review of the medical records was also performed.    Interval history:   He remains on Xolair 300 mg every 4 weeks.  This was initiated in December 2023.  He now feels that he has had an excellent response with Xolair with minimal breakthrough symptoms of pruritus and urticaria.  He is experiencing more breakthrough symptoms lately, and he thinks that this is likely related to increased exposure to a cat.  Breakthrough symptoms resolved in less than 24 hours and he has not utilized additional antihistamine.  He remains on Zyrtec 1 tablet daily.  He has found significant drowsiness with higher doses of antihistamines without any benefit in his urticaria so he prefers to stay at this dosing but is willing to utilize additional Zyrtec for breakthrough symptoms if needed.     Following his last visit, he also initiated nasal saline irrigations twice daily along with Flonase and azelastine 2 sprays each nostril twice daily.  He did find the Flonase to be quite irritating so discontinued Flonase and actually feels that his nasal congestion and postnasal drainage are fairly well-controlled with  nasal saline irrigations and azelastine alone.    Unfortunately, he has not found that this improved his productive cough and does feel that this is related more to lower airway inflammation.  He finds that his coughing and wheezing is worse in the evening but once he is able to cough up thick and deep mucus, his symptoms improved.  He does not have an albuterol inhaler on hand and has not required nebulized treatments in quite some time.  He does continue to smoke approximately half a pack of cigarettes per day and has done so for the last 25 years.  He did quit smoking for a few years but reinitiated approximately 4 to 5 years ago.  He has not yet talked to his primary care physician about smoking cessation.    Past Medical History:     Past Medical History:   Diagnosis Date    Allergies     Arthritis     Asthma     GERD (gastroesophageal reflux disease)     Gout     Hyperlipidemia     Hypertension     Shortness of breath      Medications:     Current Outpatient Medications   Medication Sig Dispense Refill    albuterol HFA 90 mcg/actuation inhaler Inhale 2 puffs every four (4) hours as needed for wheezing. 8.5 g 10    aspirin (ECOTRIN) 81 MG tablet Take 1 tablet (81 mg total) by mouth daily.      atorvastatin (LIPITOR) 80 MG tablet Take 1 tablet (80 mg total) by mouth daily.  1    azelastine (ASTELIN) 137 mcg (0.1 %) nasal spray 2 sprays into each nostril two (2) times a day. Use in each nostril as directed 30 mL 11    buPROPion (WELLBUTRIN XL) 300 MG 24 hr tablet Take 1 tablet (300 mg total) by mouth daily.  1    cetirizine (ZYRTEC) 10 MG tablet Take 2 tablets (20 mg total) by mouth two (2) times a day. 120 tablet 11    colchicine (COLCRYS) 0.6 mg tablet Take 1 tablet (0.6 mg total) by mouth once as needed.      disulfiram (ANTABUSE) 250 mg tablet Take 2 tablets (500 mg total) by mouth daily. (Patient not taking: Reported on 04/19/2022)      empty container Misc Use as directed to dispose of Xolair syringes 1 each 2    EPINEPHrine (EPIPEN) 0.3 mg/0.3 mL injection Inject 0.3 mL (0.3 mg total) into the muscle once as needed for anaphylaxis for up to 1 dose. 2 each 2    febuxostat (ULORIC) 40 mg tablet Take 1 tablet (40 mg total) by mouth at noon.      fluticasone propionate (FLONASE) 50 mcg/actuation nasal spray 2 sprays into each nostril two (2) times a day. 16 g 11    furosemide (LASIX) 20 MG tablet Take 1 tablet (20 mg total) by mouth. hydroCHLOROthiazide (HYDRODIURIL) 25 MG tablet Take 1 tablet (25 mg total) by mouth two (2) times a day.  1    HYDROcodone-acetaminophen (NORCO) 5-325 mg per tablet Take 1 tablet by mouth every eight (8) hours as needed.      hydrOXYzine (ATARAX) 25 MG tablet Take 1 tablet (25 mg total) by mouth every eight (8) hours as needed for itching (hives). 90 tablet 3    LORazepam (ATIVAN) 2 MG tablet       methylPREDNISolone (MEDROL DOSEPACK) 4 mg tablet follow package directions 1 each 0  metoprolol succinate (TOPROL-XL) 100 MG 24 hr tablet Take 1 tablet (100 mg total) by mouth daily.  1    olmesartan-hydrochlorothiazide (BENICAR HCT) 40-25 mg per tablet Take 1 tablet by mouth daily.      omalizumab (XOLAIR) 300 mg/2 mL auto-injector Inject the contents of 1 pen (300 mg) under the skin every twenty-eight (28) days. 2 mL 11    omeprazole (PRILOSEC) 20 MG capsule Take 1 capsule (20 mg total) by mouth daily.  3    tadalafiL (CIALIS) 5 MG tablet Take 1 tablet (5 mg total) by mouth daily as needed.       No current facility-administered medications for this visit.     Allergies:     Allergies   Allergen Reactions    Daypro [Oxaprozin] Anaphylaxis    Hydrocodone Bitartrate Hives    Levofloxacin Hives    Seroquel [Quetiapine]      Objective:   PHYSICAL EXAM:  Vitals: BP 116/70 (BP Site: L Arm, BP Position: Sitting)  - Pulse 61  - SpO2 97%   General : No apparent distress. Awake, alert, well appearing.  HEENT: Normocephalic, atraumatic. Mucous membranes are moist. No periorbital edema. Facial muscles move symmetrically.  Neck: Neck is symmetrical with trachea midline.  Eyes: PERRL. Eyelids and conjunctiva normal bilaterally.   Respiratory: Breathing is unlabored, no tachypnea.  Cardiovascular: No edema, no pallor, no cyanosis.  Abdomen: Non-distended.  Skin: No concerning rash or lesions noted on exposed skin.  Extremities: Normal range of motion observed. No peripheral edema.  Neuro: Mood and behavior appropriate for age.  Musculoskeletal: Symmetric and appropriate movements of extremities.    Testing done in clinic today:   -Spirometry with FVC 90.5% and FEV1 of 72% with ratio of 62% with evidence of obstruction.   -Repeat spirometry post 4 puffs albuterol: No change with no evidence of reversibility

## 2023-06-05 NOTE — Unmapped (Signed)
Pre-appointment testing orders placed in EPIC

## 2023-06-05 NOTE — Unmapped (Signed)
Addended by: Teodoro Kil on: 06/05/2023 01:16 PM     Modules accepted: Orders

## 2023-06-08 NOTE — Unmapped (Signed)
 [facility] Refill Coordination Note    Matthew Deleon, Matthew Deleon: October 23, 1960  Phone: [phone] (home)       All above HIPAA information was verified with patient.         06/07/2023    12:00 PM   Specialty Rx Medication Refill Questionnaire   Which Medications would you like refilled and shipped? xolair 300mg    Please list all current allergies: daypro   Have you missed any doses in the last 30 days? No   Have you had any changes to your medication(s) since your last refill? No   How many days remaining of each medication do you have at home? none   If receiving an injectable medication, next injection date is 06/22/2023   Have you experienced any side effects in the last 30 days? No   Please enter the full address (street address, city, state, zip code) where you would like your medication(s) to be delivered to. [address] [place] [address]   Please specify on which day you would like your medication(s) to arrive. Note: if you need your medication(s) within 3 days, please call the pharmacy to schedule your order at [phone]  06/21/2023   Has your insurance changed since your last refill? No   Would you like a pharmacist to call you to discuss your medication(s)? No   Do you require a signature for your package? (Note: if we are billing Medicare Part B or your order contains a controlled substance, we will require a signature) No         Completed refill call assessment today to schedule patient's medication shipment from the [facility] [phone]).  All relevant notes have been reviewed.       Confirmed patient received a Conservation officer, historic buildings and a Surveyor, mining with first shipment. The patient will receive a drug information handout for each medication shipped and additional FDA Medication Guides as required.         REFERRAL TO PHARMACIST     Referral to the pharmacist: Not needed      [facility]     Shipping address confirmed in Epic. Delivery Scheduled: Yes, Expected medication delivery date: 06/21/23.     Medication will be delivered via UPS to the prescription address in Epic WAM.    Matthew Deleon   [facility] Specialty Technician

## 2023-06-20 MED FILL — XOLAIR 300 MG/2 ML SUBCUTANEOUS AUTO-INJECTOR: SUBCUTANEOUS | 28 days supply | Qty: 2 | Fill #3

## 2023-07-11 ENCOUNTER — Inpatient Hospital Stay: Admit: 2023-07-11 | Discharge: 2023-07-11 | Payer: BLUE CROSS/BLUE SHIELD

## 2023-07-11 ENCOUNTER — Ambulatory Visit: Admit: 2023-07-11 | Discharge: 2023-07-11 | Payer: BLUE CROSS/BLUE SHIELD

## 2023-07-11 DIAGNOSIS — J4489 Asthma-COPD overlap syndrome (CMS-HCC): Principal | ICD-10-CM

## 2023-07-11 DIAGNOSIS — F17210 Nicotine dependence, cigarettes, uncomplicated: Principal | ICD-10-CM

## 2023-07-11 DIAGNOSIS — Z122 Encounter for screening for malignant neoplasm of respiratory organs: Principal | ICD-10-CM

## 2023-07-11 DIAGNOSIS — J449 Chronic obstructive pulmonary disease, unspecified: Principal | ICD-10-CM

## 2023-07-11 LAB — COMPREHENSIVE METABOLIC PANEL
ALBUMIN: 3.9 g/dL (ref 3.4–5.0)
ALKALINE PHOSPHATASE: 58 U/L (ref 46–116)
ALT (SGPT): 72 U/L — ABNORMAL HIGH (ref 10–49)
ANION GAP: 11 mmol/L (ref 5–14)
AST (SGOT): 58 U/L — ABNORMAL HIGH (ref <=34)
BILIRUBIN TOTAL: 0.8 mg/dL (ref 0.3–1.2)
BLOOD UREA NITROGEN: 24 mg/dL — ABNORMAL HIGH (ref 9–23)
BUN / CREAT RATIO: 19
CALCIUM: 9.7 mg/dL (ref 8.7–10.4)
CHLORIDE: 98 mmol/L (ref 98–107)
CO2: 28.3 mmol/L (ref 20.0–31.0)
CREATININE: 1.28 mg/dL — ABNORMAL HIGH (ref 0.73–1.18)
EGFR CKD-EPI (2021) MALE: 64 mL/min/{1.73_m2} (ref >=60)
GLUCOSE RANDOM: 120 mg/dL (ref 70–179)
POTASSIUM: 4.1 mmol/L (ref 3.4–4.8)
PROTEIN TOTAL: 7.1 g/dL (ref 5.7–8.2)
SODIUM: 137 mmol/L (ref 135–145)

## 2023-07-11 LAB — CBC W/ AUTO DIFF
BASOPHILS ABSOLUTE COUNT: 0.1 10*9/L (ref 0.0–0.1)
BASOPHILS RELATIVE PERCENT: 0.9 %
EOSINOPHILS ABSOLUTE COUNT: 0.4 10*9/L (ref 0.0–0.5)
EOSINOPHILS RELATIVE PERCENT: 5.2 %
HEMATOCRIT: 44.9 % (ref 39.0–48.0)
HEMOGLOBIN: 15.4 g/dL (ref 12.9–16.5)
LYMPHOCYTES ABSOLUTE COUNT: 1.9 10*9/L (ref 1.1–3.6)
LYMPHOCYTES RELATIVE PERCENT: 24.4 %
MEAN CORPUSCULAR HEMOGLOBIN CONC: 34.4 g/dL (ref 32.0–36.0)
MEAN CORPUSCULAR HEMOGLOBIN: 31.7 pg (ref 25.9–32.4)
MEAN CORPUSCULAR VOLUME: 92.1 fL (ref 77.6–95.7)
MEAN PLATELET VOLUME: 8.7 fL (ref 6.8–10.7)
MONOCYTES ABSOLUTE COUNT: 0.8 10*9/L (ref 0.3–0.8)
MONOCYTES RELATIVE PERCENT: 9.9 %
NEUTROPHILS ABSOLUTE COUNT: 4.7 10*9/L (ref 1.8–7.8)
NEUTROPHILS RELATIVE PERCENT: 59.6 %
PLATELET COUNT: 148 10*9/L — ABNORMAL LOW (ref 150–450)
RED BLOOD CELL COUNT: 4.88 10*12/L (ref 4.26–5.60)
RED CELL DISTRIBUTION WIDTH: 13.4 % (ref 12.2–15.2)
WBC ADJUSTED: 7.9 10*9/L (ref 3.6–11.2)

## 2023-07-11 MED ORDER — BUDESONIDE 160 MCG-GLYCOPYR 9 MCG-FORMOT 4.8 MCG/ACTUATION HFA INHALER
Freq: Two times a day (BID) | RESPIRATORY_TRACT | 3 refills | 0.00 days | Status: CP
Start: 2023-07-11 — End: ?

## 2023-07-11 NOTE — Unmapped (Signed)
 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 We would encourage you to use MyChart.  Please call the clinic if you have any further questions or call 911 for emergency.  Thank you for giving me the opportunity to manage your health.    If a sleep study was ordered call [facility] [phone]     If sleep clinic referral was made, call [facility]  [phone] to schedule this appointment.    If any imaging studies were ordered, if you are not contacted by the schedulers in 7-10 days, I would recommend calling [phone] to reach the imaging schedulers.    [facility] [phone]  or [phone]    [facility] specialists: [phone]     Between appointments, you can reach [place] at these numbers:    For appointments or the Pulmonary Nurse: Call Selinda Eon, COPD Clinical nurse: [phone])     [phone]  Fax: [phone] / [phone]    For nurse line call [phone] and tell them you are my patient.    For urgent issues after hours:    Hospital Operator: [phone], ask for Pulmonary Fellow on call        I don't have a MyChart. Why should I get one?   - It's encrypted, so your information is secure  - It's a quick, easy way to contact the care team, manage appointments, see test results, and more!    How do I sign-up for MyChart?   - Download the MyChart app from the [employer] or [employer] and sign-up in the app  - Sign-up online at [email or web address]        Please note that if your next visit is scheduled after October 23, 2023, we may need to contact you to adjust your visit date/time if the doctor's schedule changes. We will do this as far in advance as possible to avoid any inconvenience on your schedule.      Your COPD Action Plan      Usual activity and exercise level. I sometimes have trouble breathing that improves with my medications (rescue inhaler or nebulizer). Usual amounts of cough and phlegm/mucus. Sleeping well at night.    Action:   Take below medications  Avoid cigarette smoke, inhaled irritants, and sick contacts  Use oxygen (if prescribed)  Continue regular exercise/diet plan    Your scheduled COPD medication is:  Other: Breztri     Your COPD medication to take as needed if you become short of breath is:  Albuterol MDI (ProAir, Proventil, Ventolin, Xopenex)         I have any of the following complaints:  My cough is more frequent or severe  I am more short of breath than usual  I have more or thicker/greener mucus    Action:  Call Harriett Sine in pulmonary office [phone]) to discuss possible treatment with  antibiotics or steroids.    If your breathing is not improved after 2-3 days or your symptoms worsen, call Harriett Sine for a reassessment. If it the weekend or holiday, call the hospital operator [phone]) and ask for the Pulmonary Fellow On Call        I have any of the following:   Very short of breath even at rest  Confusion or difficulty staying awake  Coughing up blood  Chest pain  New or worsening leg swelling   Unable to eat or take care of myself    Action:   CALL 911 or have someone drive you to the closest emergency department.  Bring your medicines with you  Use your rescue inhaler while seeking help every 2 hours if needed  Use oxygen (if prescribed)         Chronic Obstructive Pulmonary Disease (COPD): Care Instructions  Your Care Instructions     Chronic obstructive pulmonary disease (COPD) is a general term for a group of lung diseases, including emphysema and chronic bronchitis. People with COPD have decreased airflow in and out of the lungs, which makes it hard to breathe. The airways also can get clogged with thick mucus. Cigarette smoking is a major cause of COPD.  Although there is no cure for COPD, you can slow its progress. Following your treatment plan and taking care of yourself can help you feel better and live longer.  Follow-up care is a key part of your treatment and safety. Be sure to make and go to all appointments, and call your doctor if you are having problems. It???s also a good idea to know your test results and keep a list of the medicines you take.  How can you care for yourself at home?  Staying healthy  Do not smoke. This is the most important step you can take to prevent more damage to your lungs. If you need help quitting, talk to your doctor about stop-smoking programs and medicines. These can increase your chances of quitting for good.  Avoid colds and flu. Get a pneumococcal vaccine shot. If you have had one before, ask your doctor whether you need a second dose. Get the flu vaccine every fall. If you must be around people with colds or the flu, wash your hands often.  Avoid secondhand smoke, air pollution, and high altitudes. Also avoid cold, dry air and hot, humid air. Stay at home with your windows closed when air pollution is bad.  Medicines and oxygen therapy  Take your medicines exactly as prescribed. Call your doctor if you think you are having a problem with your medicine.  You may be taking medicines such as:  Bronchodilators. These help open your airways and make breathing easier. Bronchodilators are either short-acting (work for 6 to 9 hours) or long-acting (work for 24 hours). You inhale most bronchodilators, so they start to act quickly. Always carry your quick-relief inhaler with you in case you need it while you are away from home.  Corticosteroids (prednisone, budesonide). These reduce airway inflammation. They come in pill or inhaled form. You must take these medicines every day for them to work well.  A spacer may help you get more inhaled medicine to your lungs. Ask your doctor or pharmacist if a spacer is right for you. If it is, ask how to use it properly.  Do not take any vitamins, over-the-counter medicine, or herbal products without talking to your doctor first.  If your doctor prescribed antibiotics, take them as  directed. Do not stop taking them just because you feel better. You need to take the full course of antibiotics.  Oxygen therapy boosts the amount of oxygen in your blood and helps you breathe easier. Use the flow rate your doctor has recommended, and do not change it without talking to your doctor first.  Activity  Get regular exercise. Walking is an easy way to get exercise. Start out slowly, and walk a little more each day.  Pay attention to your breathing. You are exercising too hard if you cannot talk while you are exercising.  Take short rest breaks when doing household chores and other activities.  Learn breathing methods--such as breathing through pursed lips--to help you become less short of breath.  If your doctor has not set you up with a pulmonary rehabilitation program, talk to him or her about whether rehab is right for you. Rehab includes exercise programs, education about your disease and how to manage it, help with diet and other changes, and emotional support.  Diet  Eat regular, healthy meals. Use bronchodilators about 1 hour before you eat to make it easier to eat. Eat several small meals instead of three large ones. Drink beverages at the end of the meal. Avoid foods that are hard to chew.  Eat foods that contain fat and protein so that you do not lose weight and muscle mass. These foods include ice cream, pudding, cheese, eggs, and peanut butter.  Use less salt. Too much salt can cause you to retain fluids, which makes it harder to breathe. Do not add salt while you are cooking or at the table. Eat fewer processed foods and foods from restaurants, including fast foods. Use fresh or frozen foods instead of canned foods.  Mental health  Talk to your family, friends, or a therapist about your feelings. It is normal to feel frightened, angry, hopeless, helpless, and even guilty. Talking openly about bad feelings can help you cope. If these feelings last, talk to your doctor.  When should you call for help?  Call 911 anytime you think you may need emergency care. For example, call if:  You have severe trouble breathing.  Call your doctor now or seek immediate medical care if:  You have new or worse trouble breathing.  You cough up blood.  You have a fever.  Watch closely for changes in your health, and be sure to contact your doctor if:  You cough more deeply or more often, especially if you notice more mucus or a change in the color of your mucus.  You have new or worse swelling in your legs or belly.  You are not getting better as expected.  Where can you learn more?  Go to [employer]com.pt  ?? 2006-2016 [employer]. Care instructions adapted under license by [facility]. This care instruction is for use with your licensed healthcare professional. If you have questions about a medical condition or this instruction, always ask your healthcare professional. [employer] disclaims any warranty or liability for your use of this information.  Content Version: 10.9.538570; Current as of: December 12, 2013     COPD Exacerbation Plan: Care Instructions     Your Care Instructions   If you have chronic obstructive pulmonary disease (COPD), your usual shortness of breath could suddenly get worse. You may start coughing more and have more mucus. This flare-up is called a COPD exacerbation (say [id number]).   A lung infection or air pollution could set off an exacerbation. Sometimes  it can happen after a quick change in temperature or being around chemicals.   Work with your doctor to make a plan for dealing with an exacerbation. You can better manage it if you plan ahead.     Follow-up care is a key part of your treatment and safety. Be sure to make and go to all appointments, and call your doctor if you are having problems. It's also a good idea to know your test results and keep a list of the medicines you take.     How can you care for yourself at home?   During an exacerbation   Do not panic if you start to have one. Quick treatment at home may help you prevent serious breathing problems. If you have a COPD exacerbation plan that you developed with your doctor, follow it.   Take your medicines exactly as your doctor tells you.   Use your inhaler as directed by your doctor. If your symptoms do not get better after you use your medicine, have someone take you to the emergency room. Call an ambulance if necessary.   With inhaled medicines, a spacer or a nebulizer may help you get more medicine to your lungs. Ask your doctor or pharmacist how to use them properly. Practice using the spacer in front of a mirror before you have an exacerbation. This may help you get the medicine into your lungs quickly.   If your doctor has given you steroid pills, take them as directed.   Your doctor may have given you a prescription for antibiotics, which you are to fill if you need it. Call your doctor if you use the prescription.   Talk to your doctor if you have any problems with your medicine.    Preventing an exacerbation   Do not smoke. This is the most important step you can take to prevent more damage to your lungs and prevent problems. If you already smoke, it is never too late to stop. If you need help quitting, talk to your doctor about stop-smoking programs and medicines. These can increase your chances of quitting for good.   Take your daily medicines as prescribed.   Avoid colds and flu.   Get a pneumococcal vaccine.   Get a flu vaccine each year, as soon as it is available. Ask those you live or work with to do the same, so they will not get the flu and infect you.   Try to stay away from people with colds or the flu.   Wash your hands often.  Avoid secondhand smoke; air pollution; cold, dry air; hot, humid air; and high altitudes. Stay at home with your windows closed when air pollution is bad.   Learn breathing techniques for COPD, such as breathing through pursed lips. These techniques can help you breathe easier during an exacerbation.    When should you call for help?   Call 911 anytime you think you may need emergency care. For example, call if:   You have severe trouble breathing.   You have severe chest pain.  Call your doctor now or seek immediate medical care if:   You have new or worse shortness of breath.   You develop new chest pain.   You are coughing more deeply or more often, especially if you notice more mucus or a change in the color of your mucus.   You cough up blood.   You have new or increased swelling in your legs or belly.  You have a fever.  Watch closely for changes in your health, and be sure to contact your doctor if:   You use your antibiotic prescription.   Your symptoms are getting worse.    Where can you learn more?   Go to https://myuncchart.org   Enter [phone] in the search box to learn more about COPD Exacerbation Plan: Care Instructions.   ?? 2006-2016 [employer]. Care instructions adapted under license by [facility]. This care instruction is for use with your licensed healthcare professional. If you have questions about a medical condition or this instruction, always ask your healthcare professional. [employer] disclaims any warranty or liability for your use of this information.   Content Version: 10.9.538570; Current as of: December 12, 2013

## 2023-07-11 NOTE — Unmapped (Signed)
 Pulmonary Clinic - Initial Visit    Referring Physician :  Dario Ave  PCP:     Yisroel Ramming, PA  Reason for Consult:   Shortness of breath/cough with mucus    HISTORY:     History of Present Illness:  [name] is a 63 y.o. male with a history of CAD with stable angina, HTN, HLD, tobacco use, alcohol use, obesity, OSA, chronic urticaria, chronic rhinitis,  whom we are seeing in consultation requested by Dario Ave for evaluation of Shortness of Breath.    Patient is a very pleasant 63 year old male, with above-mentioned history, with history of childhood asthma, chronic urticaria on Xolair, obesity, sleep apnea comes in for evaluation and management of his shortness of breath.    Patient mentions that he had childhood asthma, which was quite severe, at some point requiring nebulization and visits to the urgent care in the emergency rooms.  However he notes not remembering being admitted or requiring ventilation for the same.  He notes that he grew out of it over time.  However he started noticing that he had a lot of allergies to environmental allergens such as ragweed, cat dander to the point that it was getting uncontrolled with chest medicines.  At 1 point he was doing Zyrtec morning and evening.  Eventually he was switched to hydroxychloroquine and then to Xolair shots in December 2023 since then his symptoms have been very well-controlled.  He was also put on Symbicort for his asthma 2 puffs twice a day however notes that his shortness of breath is not very well-controlled.  He can walk a block but then has to stop afterwards, he used to be able to climb many flights of stairs but now has to take a break after 1 or 2 flights.  He notes mucus and cough during bedtime and then in the morning which is mucoid in nature.  He tried the [employer] but that did not help.  He takes omeprazole regularly every day as he he has had heartburn and reflux disease for many years.    He denies any active wheezing but notes that when his shortness of breath is uncontrolled, he has chest tightness and wheezing.    He was also diagnosed with sleep apnea many years ago and used to see a sleep doctor but he was extremely uncomfortable using the machine and does not want to use it anymore.    History from chart review:  Sees allergist regularly - on xolair, since dec 2023, pruritus and urticaria well controlled on it.       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    Allergy: Mostly in the fall season with ragweed, cat dander    Exposure history: Asbestos as noted above, smoking history.  Lives in a trailer that was  built in 1993.    Pets/birds: Used to have a dog before, otherwise currently no pets or birds.    Social history: Used to be an avid traveler with cruises however reduced since wife expired in 2019, started traveling again now.  Also uses alcohol, sixpack a day, 16 ounces each which is cut down from whole case which had 12 beers every day.  Does not drink hard liquor.    Family History: Has 2 daughters, 1 Works as a Sport and exercise psychologist and the other 1 is a Scientist, water quality.  No significant lung disease in the family.  Father had some asbestos related lung disease however he is not sure what it was    Sleep history: Diagnosed with sleep apnea in 2013, report below currently, does not note central headaches or waking up in the middle of the night due to apnea events but has to wake up in the middle of the night due to urinary urgency.    Past Medical History:  Past Medical History:   Diagnosis Date    Allergies     Arthritis     Asthma     GERD (gastroesophageal reflux disease)     Gout     Hyperlipidemia     Hypertension     Shortness of breath      Past Surgical History:   Procedure Laterality Date    KNEE SURGERY Bilateral 08/2011    right knee/meniscus    ROTATOR CUFF REPAIR  03/07/2018       Other History:  The social history and family history were personally reviewed and updated in the patient's electronic medical record.    Family History   Problem Relation Age of Onset    Heart disease Mother     Breast cancer Mother     Pulmonary fibrosis Father     Coronary artery disease Brother     Heart attack Brother     Esophageal cancer Neg Hx     Colon cancer Neg Hx     Glaucoma Neg Hx     Macular degeneration Neg Hx     Retinal detachment Neg Hx      Social History     Socioeconomic History    Marital status: Widowed   Tobacco Use    Smoking status: Every Day     Current packs/day: 0.50     Average packs/day: 0.5 packs/day for 35.2 years (17.6 ttl pk-yrs)     Types: Cigarettes     Start date: 1990     Passive exposure: Past    Smokeless tobacco: Never   Substance and Sexual Activity    Alcohol use: Yes     Alcohol/week: 2.0 standard drinks of alcohol     Types: 2 Cans of beer per week     Comment: 3-8 weekly    Drug use: Never   Social History Narrative    He lives in [place], [place]. He is a widower. His daughter and grandson live with him. He is an Journalist, newspaper.  He smokes 1/2 a pack daily x 5 years. He smoked 1-2 packs a day for about 30 years and then quit for 10 years.  He drinks about 6 beers daily. He denies illicit drug use.       Home Medications:  Current Outpatient Medications on File Prior to Visit   Medication Sig Dispense Refill    albuterol HFA 90 mcg/actuation inhaler Inhale 2 puffs every four (4) hours as needed for  wheezing. 8.5 g 10    aspirin (ECOTRIN) 81 MG tablet Take 1 tablet (81 mg total) by mouth daily.      atorvastatin (LIPITOR) 80 MG tablet Take 1 tablet (80 mg total) by mouth daily.  1    azelastine (ASTELIN) 137 mcg (0.1 %) nasal spray 2 sprays into each nostril two (2) times a day. Use in each nostril as directed 30 mL 11    buPROPion (WELLBUTRIN XL) 300 MG 24 hr tablet Take 1 tablet (300 mg total) by mouth daily.  1    cetirizine (ZYRTEC) 10 MG tablet Take 2 tablets (20 mg total) by mouth two (2) times a day. 120 tablet 11    colchicine (COLCRYS) 0.6 mg tablet Take 1 tablet (0.6 mg total) by mouth once as needed.      disulfiram (ANTABUSE) 250 mg tablet Take 2 tablets (500 mg total) by mouth daily. (Patient not taking: Reported on 04/19/2022)      empty container Misc Use as directed to dispose of Xolair syringes 1 each 2    EPINEPHrine (EPIPEN) 0.3 mg/0.3 mL injection Inject 0.3 mL (0.3 mg total) into the muscle once as needed for anaphylaxis for up to 1 dose. 2 each 2    febuxostat (ULORIC) 40 mg tablet Take 1 tablet (40 mg total) by mouth at noon.      fluticasone propionate (FLONASE) 50 mcg/actuation nasal spray 2 sprays into each nostril two (2) times a day. 16 g 11    furosemide (LASIX) 20 MG tablet Take 1 tablet (20 mg total) by mouth.      hydroCHLOROthiazide (HYDRODIURIL) 25 MG tablet Take 1 tablet (25 mg total) by mouth two (2) times a day.  1    HYDROcodone-acetaminophen (NORCO) 5-325 mg per tablet Take 1 tablet by mouth every eight (8) hours as needed.      hydrOXYzine (ATARAX) 25 MG tablet Take 1 tablet (25 mg total) by mouth every eight (8) hours as needed for itching (hives). 90 tablet 3    LORazepam (ATIVAN) 2 MG tablet       methylPREDNISolone (MEDROL DOSEPACK) 4 mg tablet follow package directions 1 each 0    metoprolol succinate (TOPROL-XL) 100 MG 24 hr tablet Take 1 tablet (100 mg total) by mouth daily.  1    olmesartan-hydrochlorothiazide (BENICAR HCT) 40-25 mg per tablet Take 1 tablet by mouth daily.      omalizumab (XOLAIR) 300 mg/2 mL auto-injector Inject the contents of 1 pen (300 mg) under the skin every twenty-eight (28) days. 2 mL 11    omeprazole (PRILOSEC) 20 MG capsule Take 1 capsule (20 mg total) by mouth daily.  3    tadalafiL (CIALIS) 5 MG tablet Take 1 tablet (5 mg total) by mouth daily as needed.       No current facility-administered medications on file prior to visit.       Allergies:  Allergies as of 07/11/2023 - Reviewed 06/05/2023   Allergen Reaction Noted    Daypro [oxaprozin] Anaphylaxis 09/24/2019    Hydrocodone bitartrate Hives 06/03/2014    Levofloxacin Hives 01/30/2011    Seroquel [quetiapine]  01/08/2020       Review of Systems:  A comprehensive review of systems was completed and negative except as noted in HPI.    PHYSICAL EXAM:   BP 115/76 (BP Site: L Arm, BP Position: Sitting, BP Cuff Size: Large)  - Pulse 64  - Temp 36.2 ??C (97.2 ??F) (Temporal)  - Ht 172.7 cm (  5' 7.99)  - Wt (!) 125.2 kg (276 lb)  - SpO2 95%  - BMI 41.98 kg/m??     Gen: Patient is a pleasant male, morbidly obese, awake, alert, oriented to time place and person, no pallor, icterus,  cyanosis or clubbing.  Eyes: Pupils equal round and reacting light bilaterally  Head neck ENT: No JVD, no lymphadenopathy-cervical or supraclavicular, no thyromegaly, moist mucosa, normal oropharynx  CVS: S1-S2 heard normally, no murmurs appreciated, no rubs or gallops  Respiratory: Air entry equal bilaterally without any crackles or wheeze  Abdomen: Soft, nontender, nondistended, no organomegaly, bowel sounds present  Neuro: Nonfocal neurological exam, no sensory deficits, cranial nerves grossly intact  Skin extremities: No rash, no pedal edema     LABORATORY and RADIOLOGY DATA:     Pulmonary Function Tests/Interpretation:    07/11/2023          06/05/2023        Pertinent Laboratory Data:    Lab Results   Component Value Date    WBC 10.2 11/17/2022    HGB 14.8 11/17/2022    HCT 41.9 11/17/2022    PLT 166 11/17/2022       Lab Results   Component Value Date    NA 134 (L) 11/17/2022    K 3.6 11/17/2022    CL 95 (L) 11/17/2022    CO2 27.0 11/17/2022    BUN 21 (H) 11/17/2022    CREATININE 1.42 (H) 11/17/2022    GLU 122 11/17/2022    CALCIUM 8.8 11/17/2022    MG 1.3 (L) 06/06/2022       Lab Results   Component Value Date    BILITOT 0.6 10/21/2021    PROT 7.7 10/21/2021    ALBUMIN 3.8 10/21/2021    ALT 123 (H) 10/21/2021    AST 78 (H) 10/21/2021    ALKPHOS 57 10/21/2021      Latest Reference Range & Units 06/06/22 08:51 11/17/22 12:45   Absolute Eosinophils 0.0 - 0.4 10*9/L 0.3 0.5 (H)   (H): Data is abnormally high     Latest Reference Range & Units 10/21/21 12:29   Alpha-gal IgE <0.1 kUA/L 10.9 (H)   (H): Data is abnormally high        Pertinent Imaging Data:  ECG: Sinus rhythm with right bundle branch block; unchanged    Echo: 12/2019  Summary    1. The left ventricle is normal in size with mildly increased wall thickness.    2. The left ventricular systolic function is normal, LVEF is visually estimated at 60-65%.    3. There is grade I diastolic dysfunction (impaired relaxation).    4. The mitral valve leaflets are mildly thickened with normal leaflet mobility.    5. Mitral annular calcification is present (mild).    6. The aortic valve is trileaflet with mildly thickened leaflets with normal excursion.    7. The left atrium is mildly dilated in size.    8. The right ventricle is normal in size, with normal systolic function.    Stress test: PET/CT 12/2019  IMPRESSIONS:  - Normal myocardial perfusion study  - No evidence of any significant ischemia or scar  - Perfusion images demonstrate homogeneous tracer distribution throughout the myocardium with no stress induced perfusion abnormalities.  - Left ventricular systolic function is normal. Post stress the ejection fraction is > 60%.  - Coronary calcifications are noted    Sleep Study 2013: OXYGEN DATA: The desaturation index was 17 events per hour. The lowest  desaturation was 82%. He spent 1.6 minutes with a saturation less than  88%. Moderate obstructive sleep apnea with predominant hypopneas causing  sleep fragmentation and oxygen desaturation. This seems to be corrected by a CPAP of 12 cm with a full-face  mask. Titration was optimal.       ASSESSMENT and PLAN     Bray Vickerman is a 63 y.o. male with morbid obesity, childhood asthma, chronic urticaria on Xolair, tobacco use, comes in for evaluation of his shortness of breath despite being on Symbicort as well as mucoid expectoration.    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    Patient's asthma is highly likely allergic phenotype in nature.  We will get some basic allergy labs including ABPA panel and IgE for evaluation.  Despite being on Xolair, if his IgE is still elevated this might be something I can discuss with his allergist to see if there can be other biologic options that can be used he did have high eosinophils 1 year ago, therefore I would like to phenotype and look for those as well.    Patient COPD is currently Gold 1E, given FEV1 is 86% and his eosinophils are more than 300.  His mMRC is grade 2 and on worst days grade 3.  I discussed at length with him about his COPD, given his DLCO was normal I do not anticipate hypoxemia on exertion however I do anticipate hypoxemia at night given his sleep apnea.  Since he does not want to use his CPAP machine at all, we discussed using oxygen at nighttime if he desaturates below 88% as it can or a long time lead to pulmonary hypertension and difficult to control systemic hypertension as well.  I would also like to do an alpha-1 antitrypsin test on him given his COPD.  We discussed at length regarding tobacco cessation and he was very much willing to get referred to the tobacco cessation clinic today.  We also did a lung cancer screening discussion and he would be willing to start that as well.    Asthma-COPD overlap  -Phenotype asthma  -Gold COPD 1 E  - potential for future use of Dupixent if necessary however if this will have to be coordinated with allergist given his chronic urticaria is well-controlled on Xolair  -Will switch to ICS/LABA LAMA therapy from ICS/LABA therapy given uncontrolled symptoms on Symbicort as well as need for LAMA additional to the LABA for his COPD.  Will start with Breztri 2 puffs twice a day, spacer given, gargling instructions given.  Patient will let us know if this is cost prohibitive.      Oxygen Requirement  The patient is not on home oxygen therapy. at rest and is not on home oxygen therapy. with exercise.  DLCO is more than 100%.  I do not anticipate exertional hypoxemia however given his sleep apnea which is not controlled, I will get him to do an overnight oximetry and he is willing to use oxygen at night if positive.    Chronic NIV/chronic hypercapnia:  Given DLCO is not low, I do not anticipate hypercapnia, prior bicarb levels are nonelevated as well.  Will hold off on ABG testing at this stage    Chronic  macrolide and steroid use:  Currently not indicated    Chronic mucus at night and in the morning:  -Tried Flonase in the past however not worked.  Reflux is also very well treated at this stage.  This could probably be secondary to his smoking which she did mention that this mucus expectoration had completely gone away when he had quit smoking.  I would like to investigate by doing a sputum culture and have given him cups to deliver at [facility].      Health Maintenance:  Notes that he was up-to-date with flu vaccine.  Did not want to take the COVID-vaccine today.  We will give him the PCV 21 shot today.  Most Recent Immunizations   Administered Date(s) Administered    COVID-19 VACCINE,MRNA(MODERNA)(PF) 04/13/2020    INFLUENZA VACCINE IIV3(IM)(PF)6 MOS UP 02/20/2023    PCV21 (Capvaxive) (Pneumococcal 21-valent Conjugate Vaccine) 07/11/2023       Smoking Cessation:  The patient is currently a smoker. The patient has been instructed to discontinue using tobacco products, and counseled on benefits of smoking cessation. Information regarding tobacco cessation programs has been made available to the patient.  Ambulatory referral to tobacco cessation program has been made.  Patient was willing to start this.    Pulmonary rehab:  Currently not indicated.     Lung Cancer Screening:  The USPSTF recommends annual screening for lung cancer with low-dose computed tomography in adults ages 36 to 3 years who have a 20 pack-year smoking history and currently smoke or have quit within the past 15 years. Screening should be discontinued once a person has not smoked for 15 years or develops a health problem that substantially limits life expectancy or the ability or willingness to have curative lung surgery.    Mr.Tomko does meet criteria for lung cancer screening according to the USPSTF guidelines.     We conducted a shared decision-making process using a decision aid. We reviewed benefits and harms of screening, including false positives and potential need for additional diagnostic testing, the possibility of over diagnosis, and total radiation exposure.      We discussed the importance of adhering to annual LDCT screening. We also discussed the impact of comorbidities on the patient???s the ability or willingness to undergo diagnostic procedure(s) and treatment     We discussed the importance of achieving/maintaining abstinence from cigarette smoking.     Based on our discussion, we have decided to begin annual lung cancer screening starting now        Diagnoses and all orders for this visit:    Encounter for screening for lung cancer  -     CT Lung Cancer Screening Baseline or Annual Low Dose; Future    Chronic obstructive pulmonary disease, unspecified COPD type (CMS-HCC)  -     Cancel: Overnight Pulse Oximetry; Future  -     IgE Total; Future  -     Aspergillus Specific Precipitins; Future  -     CBC w/ Differential; Future  -     Comprehensive Metabolic Panel; Future  -     Comprehensive Environmental Panel; Future  -     Ambulatory referral to Pulmonology  -     CT Lung Cancer Screening Baseline or Annual Low Dose; Future  -     Overnight Pulse Oximetry; Future  -     budesonide-glycopyrrolate-formoterol (BREZTRI) 160-9-4.8 mcg/inh; Inhale 2 puffs two (2) times a day. Use a spacer and gargle after each use  -  Comprehensive Environmental Panel  -     Comprehensive Metabolic Panel  -     CBC w/ Differential  -     Aspergillus Specific Precipitins  -     IgE Total    Cigarette nicotine dependence without complication  -     CT Lung Cancer Screening Baseline or Annual Low Dose; Future  -     Ambulatory referral to Smoking Cessation Program; Future    Asthma-COPD overlap syndrome (CMS-HCC)  -     Cancel: Overnight Pulse Oximetry; Future  -     IgE Total; Future  -     Aspergillus Specific Precipitins; Future  -     CBC w/ Differential; Future  -     Comprehensive Metabolic Panel; Future  -     Comprehensive Environmental Panel; Future  -     Overnight Pulse Oximetry; Future  -     budesonide-glycopyrrolate-formoterol (BREZTRI) 160-9-4.8 mcg/inh; Inhale 2 puffs two (2) times a day. Use a spacer and gargle after each use  -     Ambulatory referral to Smoking Cessation Program; Future  -     Sputum Culture; Future  -     Sputum Culture  -     Comprehensive Environmental Panel  -     Comprehensive Metabolic Panel  -     CBC w/ Differential  -     Aspergillus Specific Precipitins  -     IgE Total    Other orders  -     PCV-21 Capvaxive      Plan of care was discussed with the patient who acknowledged understanding and is in agreement.    Patient will return to clinic in 3 months or sooner if needed.    [id number] Jamse Mead, Terie Purser [place], [place]    Maia Plan, MD  Assistant Professor, Pulmonary and Critical Care  Pager: [phone]  July 11, 2023 5:51 AM      I am located on-site and the patient is located on-site for this visit.

## 2023-07-12 NOTE — Unmapped (Signed)
 [facility] Refill Coordination Note    Specialty Medication(s) to be Shipped:   CF/Pulmonary/Asthma: Xolair    Other medication(s) to be shipped: No additional medications requested for fill at this time     Matthew Deleon, DOB: 1961/02/02  Phone: [phone] (home)       All above HIPAA information was verified with patient.     Was a Nurse, learning disability used for this call? No    Completed refill call assessment today to schedule patient's medication shipment from the [facility]  [phone]).  All relevant notes have been reviewed.     Specialty medication(s) and dose(s) confirmed: Regimen is correct and unchanged.   Changes to medications: Matthew Deleon reports starting the following medications: Started  Clinical cytogeneticist  and   stopped  Symbicort    Changes to insurance: No  New side effects reported not previously addressed with a pharmacist or physician: None reported  Questions for the pharmacist: No    Confirmed patient received a Conservation officer, historic buildings and a Surveyor, mining with first shipment. The patient will receive a drug information handout for each medication shipped and additional FDA Medication Guides as required.       DISEASE/MEDICATION-SPECIFIC INFORMATION        For patients on injectable medications: Patient currently has 0  doses left.  Next injection is scheduled for 07/23/23 .    SPECIALTY MEDICATION ADHERENCE     Medication Adherence    Patient reported X missed doses in the last month: 0  Specialty Medication: omalizumab: XOLAIR 300 mg/2 mL auto-injector  Patient is on additional specialty medications: No  Patient is on more than two specialty medications: No  Any gaps in refill history greater than 2 weeks in the last 3 months: no  Demonstrates understanding of importance of adherence: yes              Were doses missed due to medication being on hold? No      XOLAIR 300 mg/2 mL auto-injector: 0  doses of medicine on hand        REFERRAL TO PHARMACIST     Referral to the pharmacist: Not needed      SHIPPING     Shipping address confirmed in Epic.     Cost and Payment: Patient has a $0 copay, payment information is not required.    Delivery Scheduled: Yes, Expected medication delivery date: 07/18/23 .     Medication will be delivered via UPS to the prescription address in Epic WAM.    Ricci Barker   [facility]  Specialty Technician

## 2023-07-13 NOTE — Unmapped (Signed)
 Order for overnight oximetry sent to [facility]    Patient scheduled for LDCT on 08/10/2023 at [facility]

## 2023-07-16 LAB — COMPREHENSIVE ENVIRONMENTAL PANEL
ALTERNARIA ALTERNATA IGE: <0.35 kU/L (ref <0.35)
ASPERGILLUS FUMIGATUS IGE: <0.35 kU/L (ref <0.35)
ASPERGILLUS NIGER IGE: <0.35 kU/L (ref <0.35)
BAHIA GRASS IGE: <0.35 kU/L (ref <0.35)
BEECH (AMERICAN) TREE IGE: <0.35 kU/L (ref <0.35)
BERMUDA GRASS IGE: <0.35 kU/L (ref <0.35)
BIRCH (COMMON SILVER) TREE IGE: <0.35 kU/L (ref <0.35)
BOX ELDER TREE IGE: <0.35 kU/L (ref <0.35)
CANDIDA ALBICANS IGE: <0.35 kU/L (ref <0.35)
CAT DANDER IGE: 7 kU/L — ABNORMAL HIGH (ref <0.35)
CLADOSPORIUM HERBARUM IGE: <0.35 kU/L (ref <0.35)
COCKLEBUR IGE: <0.35 kU/L (ref <0.35)
COTTONWOOD (WHITE POPLAR) TREE IGE: <0.35 kU/L (ref <0.35)
D. FARINAE IGE: 9.69 kU/L — ABNORMAL HIGH (ref <0.35)
D. PTERONYSSINUS IGE: 6.84 kU/L — ABNORMAL HIGH (ref <0.35)
DOG DANDER IGE: 5.59 kU/L — ABNORMAL HIGH (ref <0.35)
ELM TREE IGE: <0.35 kU/L (ref <0.35)
ENGLISH PLANTAIN IGE: <0.35 kU/L (ref <0.35)
EPICOCCUM PURPURASCENS IGE: <0.35 kU/L (ref <0.35)
FUSARIUM PROLIFERATUM IGE: <0.35 kU/L (ref <0.35)
GERMAN COCKROACH IGE: <0.35 kU/L (ref <0.35)
GIANT RAGWEED IGE: 0.49 kU/L — ABNORMAL HIGH (ref <0.35)
GOOSEFOOT (LAMB'S QUARTERS) IGE: <0.35 kU/L (ref <0.35)
JOHNSON GRASS IGE: <0.35 kU/L (ref <0.35)
MAPLE LEAF SYCAMORE IGE: <0.35 kU/L (ref <0.35)
MOUSE IGE: <0.35 kU/L (ref <0.35)
MUCOR RACEMOSUS IGE: <0.35 kU/L (ref <0.35)
MUGWORT IGE: <0.35 kU/L (ref <0.35)
P CHRYSOGENUM (P NOTATUM) IGE: <0.35 kU/L (ref <0.35)
PECAN (HICKORY) IGE: <0.35 kU/L (ref <0.35)
PIGWEED, COMMON IGE: <0.35 kU/L (ref <0.35)
RAGWEED, SHORT (COMMON) IGE: 1.54 kU/L — ABNORMAL HIGH (ref <0.35)
RHIZOPUS NIGRICAN IGE: <0.35 kU/L (ref <0.35)
SETOMELANOMMA ROSTRATA (H. HALODES) IGE: <0.35 kU/L (ref <0.35)
SHEEP SORREL IGE: <0.35 kU/L (ref <0.35)
TIMOTHY GRASS IGE: <0.35 kU/L (ref <0.35)
TRICHOPHYTON RUBRUM IGE: <0.35 kU/L (ref <0.35)
WALNUT TREE IGE: <0.35 kU/L (ref <0.35)
WHITE ASH TREE IGE: <0.35 kU/L (ref <0.35)
WHITE OAK TREE IGE: <0.35 kU/L (ref <0.35)
WILLOW TREE IGE: <0.35 kU/L (ref <0.35)

## 2023-07-16 LAB — IGE: TOTAL IGE: 2435 [IU]/mL — ABNORMAL HIGH (ref 2–214)

## 2023-07-17 MED FILL — XOLAIR 300 MG/2 ML SUBCUTANEOUS AUTO-INJECTOR: SUBCUTANEOUS | 28 days supply | Qty: 2 | Fill #4

## 2023-07-20 LAB — ASPERGILLUS SPECIFIC PRECIPITINS
A. FUM IGE CLASS: 0
A. FUMIGATUS IGE: <0.1 kU/L
A. FUMIGATUS MGD: NEGATIVE
TOTAL IGE (ASP. PRECIP): 1678 [IU]/mL — ABNORMAL HIGH

## 2023-07-24 LAB — PI TYPING

## 2023-08-10 ENCOUNTER — Inpatient Hospital Stay: Admit: 2023-08-10 | Discharge: 2023-08-11 | Payer: BLUE CROSS/BLUE SHIELD

## 2023-08-13 NOTE — Unmapped (Signed)
 [facility] and Home Delivery Pharmacy Refill Coordination Note    Matthew Deleon, [place]: [birth year]  Phone: [phone] (home)       All above HIPAA information was verified with patient.         08/09/2023     2:35 PM   Specialty Rx Medication Refill Questionnaire   Which Medications would you like refilled and shipped? xolair    Please list all current allergies: dapro   Have you missed any doses in the last 30 days? No   Have you had any changes to your medication(s) since your last refill? No   How many days remaining of each medication do you have at home? none   If receiving an injectable medication, next injection date is 08/19/2023   Have you experienced any side effects in the last 30 days? No   Please enter the full address (street address, city, state, zip code) where you would like your medication(s) to be delivered to. [address] [place] [address]   Please specify on which day you would like your medication(s) to arrive. Note: if you need your medication(s) within 3 days, please call the pharmacy to schedule your order at [phone]  08/17/2023   Has your insurance changed since your last refill? No   Would you like a pharmacist to call you to discuss your medication(s)? No   Do you require a signature for your package? (Note: if we are billing Medicare Part B or your order contains a controlled substance, we will require a signature) No   I have been provided my out of pocket cost for my medication and approve the pharmacy to charge the amount to my credit card on file. Yes         Completed refill call assessment today to schedule patient's medication shipment from the [facility] Delivery [facility] [phone]).  All relevant notes have been reviewed.       Confirmed patient received a Conservation officer, historic buildings and a Surveyor, mining with first shipment. The patient will receive a drug information handout for each medication shipped and additional FDA Medication Guides as required. REFERRAL TO PHARMACIST     Referral to the pharmacist: Not needed      [facility]     Shipping address confirmed in Epic.     Delivery Scheduled: Yes, Expected medication delivery date: 08/17/23.     Medication will be delivered via UPS to the prescription address in Epic WAM.    Jacub Waiters   [facility] Specialty Technician

## 2023-08-16 MED FILL — XOLAIR 300 MG/2 ML SUBCUTANEOUS AUTO-INJECTOR: SUBCUTANEOUS | 28 days supply | Qty: 2 | Fill #5

## 2023-09-07 NOTE — Unmapped (Signed)
 [facility] Delivery Pharmacy Refill Coordination Note    Matthew Deleon, [place]: 07-19-1960  Phone: [phone] (home)       All above HIPAA information was verified with patient.         09/07/2023    11:42 AM   Specialty Rx Medication Refill Questionnaire   Which Medications would you like refilled and shipped? xolair   Please list all current allergies: daypro   Have you missed any doses in the last 30 days? No   Have you had any changes to your medication(s) since your last refill? No   How many days remaining of each medication do you have at home? none   If receiving an injectable medication, next injection date is 09/16/2023   Have you experienced any side effects in the last 30 days? No   Please enter the full address (street address, city, state, zip code) where you would like your medication(s) to be delivered to. [address] [place]  [address]   Please specify on which day you would like your medication(s) to arrive. Note: if you need your medication(s) within 3 days, please call the pharmacy to schedule your order at [phone]  09/14/2023   Has your insurance changed since your last refill? No   Would you like a pharmacist to call you to discuss your medication(s)? No   Do you require a signature for your package? (Note: if we are billing Medicare Part B or your order contains a controlled substance, we will require a signature) No   I have been provided my out of pocket cost for my medication and approve the pharmacy to charge the amount to my credit card on file. Yes   Additional Comments: thank you         Completed refill call assessment today to schedule patient's medication shipment from the [facility] Pharm[facility] [phone]).  All relevant notes have been reviewed.       Confirmed patient received a Conservation officer, historic buildings and a Surveyor, mining with first shipment. The patient will receive a drug information handout for each medication shipped and additional FDA Medication Guides as required.         REFERRAL TO PHARMACIST     Referral to the pharmacist: Not needed      [facility]     Shipping address confirmed in Epic.     Delivery Scheduled: Yes, Expected medication delivery date: 09/14/23.     Medication will be delivered via UPS to the prescription address in Epic WAM.    Stephen Ehrlich   [facility] Delivery Pharm[facility] Specialty Technician

## 2023-09-10 ENCOUNTER — Ambulatory Visit: Admit: 2023-09-10 | Discharge: 2023-09-11 | Payer: BLUE CROSS/BLUE SHIELD

## 2023-09-10 DIAGNOSIS — I10 Essential (primary) hypertension: Principal | ICD-10-CM

## 2023-09-10 NOTE — Unmapped (Signed)
 DIVISION OF CARDIOLOGY  [facility] Cardiology Clinic Note  Date of Service: 09/14/23        H&P: Matthew Deleon is a 63 y.o. year-old male w/ a PMHx significant for CAD w/ stable angina (PET/CT w/ CAC but normal perfusion), HTN, HLD, tobacco use, EtOH use, obesity, OSA not on CPAP, who presents for follow-up. The patient last saw Dr. Garlon Jupiter 09/06/2022.     Subjective/Interval Events:  - taking aspirin 81, atorvastatin 80 daily, lasix 20 daily, hydrochlorothiazide 25 bid, and metoprolol succinte 100 mg once daily  - coronary calcifications on CT scan done recently for lung cancer screening  - tired, concerned he is out of shape  - climbed 7 flights of stairs on a cruise ship carrying heavy items in September of last year; had to catch his breath at the top of the stairs, felt a little lightheaded; tight chest but no other symptoms  - has dx of alpha gal recently although he reports he eats lots of meat and has no issues with eating it  - lasix has helped with leg swelling; taking it in the morning, has not had any leg swelling since starting lasix and continues to take it routinely       ROS   Negative aside from that described above       HOME MEDICATIONS     Current Outpatient Medications   Medication Instructions    albuterol HFA 90 mcg/actuation inhaler 2 puffs, Inhalation, Every 4 hours PRN    aspirin (ECOTRIN) 81 mg, Daily (standard)    atorvastatin (LIPITOR) 80 mg, Daily (standard)    azelastine (ASTELIN) 137 mcg (0.1 %) nasal spray 2 sprays, Each Nare, 2 times a day (standard), Use in each nostril as directed    budesonide-glycopyrrolate-formoterol (BREZTRI) 160-9-4.8 mcg/inh 2 puffs, Inhalation, 2 times a day (standard), Use a spacer and gargle after each use    buPROPion (WELLBUTRIN XL) 300 mg, Daily (standard)    cetirizine (ZYRTEC) 20 mg, Oral, 2 times a day    colchicine (COLCRYS) 0.6 mg, Oral, Once as needed    disulfiram (ANTABUSE) 500 mg, Daily (standard)    empty container Misc Use as directed to dispose of Xolair syringes    EPINEPHrine (EPIPEN) 0.3 mg, Intramuscular, Once as needed    febuxostat (ULORIC) 40 mg, Daily (1100)    fluticasone propionate (FLONASE) 50 mcg/actuation nasal spray 2 sprays, Each Nare, 2 times a day    furosemide (LASIX) 20 mg    hydroCHLOROthiazide (HYDRODIURIL) 25 mg, 2 times a day (standard)    HYDROcodone-acetaminophen (NORCO) 5-325 mg per tablet 1 tablet, Oral, Every 8 hours PRN    hydrOXYzine (ATARAX) 25 mg, Oral, Every 8 hours PRN    LORazepam (ATIVAN) 2 MG tablet     methylPREDNISolone (MEDROL DOSEPACK) 4 mg tablet follow package directions    metoPROLOL succinate (TOPROL-XL) 100 mg, Daily (standard)    olmesartan-hydrochlorothiazide (BENICAR HCT) 40-25 mg per tablet 1 tablet, Daily (standard)    omalizumab (XOLAIR) 300 mg/2 mL auto-injector Inject the contents of 1 pen (300 mg) under the skin every  twenty-eight (28) days.    omeprazole (PRILOSEC) 20 mg, Daily (standard)    predniSONE (DELTASONE) 10 mg tablet pack Daily PRN    tadalafil (CIALIS) 5 mg, Daily PRN        ALLERGIES:  Allergies   Allergen Reactions    Daypro [Oxaprozin] Anaphylaxis    Hydrocodone Bitartrate Hives    Levofloxacin Hives    Seroquel [Quetiapine]        PHYSICAL EXAM:  Vitals:    09/10/23 1034   BP: 121/77   Pulse: 65   Resp: 16   Temp: 36.7 ??C (98 ??F)   SpO2: 95%   GEN: Awake, alert, NAD  CV: RRR, normal S1 and S2, no obvious MRG  PULM: CTAB anteriorly and posteriorly, no increased WOB  EXTREMITIES: WWP, no lower extremity edema, distal pulses intact  SKIN: No obvious lower extremity rashes or ulcers.  NEUROLOGIC: Alert, interactive, and appropriate, grossly moving all 4 extremities.       OBJECTIVE DATA     Lab Results   Component Value Date    WBC 7.9 07/11/2023    HGB 15.4 07/11/2023    HCT 44.9 07/11/2023    PLT 148 (L) 07/11/2023       Lab Results   Component Value Date    NA 137 07/11/2023    K 4.1 07/11/2023    CL 98 07/11/2023    CO2 28.3 07/11/2023    BUN 24 (H) 07/11/2023    CREATININE 1.28 (H) 07/11/2023    GLU 120 07/11/2023    CALCIUM 9.7 07/11/2023    MG 1.3 (L) 06/06/2022       Lab Results   Component Value Date    BILITOT 0.8 07/11/2023    PROT 7.1 07/11/2023    ALBUMIN 3.9 07/11/2023    ALT 72 (H) 07/11/2023    AST 58 (H) 07/11/2023    ALKPHOS 58 07/11/2023        CARDIODIAGNOSTICS   ECG 09/06/22:           TTE 01/22/2020:    Summary  1. The left ventricle is normal in size with mildly increased wall thickness.  2. The left ventricular systolic function is normal, LVEF is visually estimated at 60-65%.  3. There is grade I diastolic dysfunction (impaired relaxation).  4. The mitral valve leaflets are mildly thickened with normal leaflet mobility.  5. Mitral annular calcification is present (mild).  6. The aortic valve is trileaflet with mildly thickened leaflets with normal excursion.  7. The left atrium is mildly dilated in size.  8. The right ventricle is normal in size, with normal systolic function.     Pharmacologic Pet CT 01/15/2020:   IMPRESSIONS:   __________________________________________________________________      - Normal myocardial perfusion study   - No evidence of any significant ischemia or scar   - Perfusion images demonstrate homogeneous tracer distribution throughout   the myocardium with no stress induced perfusion abnormalities.   - Left ventricular systolic function is normal. Post stress the ejection   fraction is > 60%.   - Coronary calcifications are noted        ASSESSMENT   Matthew Deleon is a 63 y.o. year-old male w/ a PMHx significant for CAD w/ stable angina (PET/CT w/ CAC but normal perfusion), HTN, HLD, tobacco use, EtOH use, obesity, OSA not on CPAP, who presents for follow-up. The patient last saw Dr. Garlon Jupiter 09/06/2022.   The patient is doing well. He had an episode  of lightheadedness and some chest tightness after climbing several flights of stairs while carrying heavy items on a cruise ship last year but otherwise has been largely asymptomatic Given his previously normal PET/CT and his lack of other anginal symptoms with routine and even strenuous daily activity, this episode may represent deconditioning more than angina. I discussed return precautions with the patient and recommended he perform symptom limited exercise more regularly, both to help lose weight as well as to help assess if he can increase his exertional capacity. I instructed him to inform me if he develops more regular symptoms as he increases his exertion but otherwise to continue his current medications for secondary prevention of CAD and HTN mgmt.     RECOMMENDATIONS   1. CAD with stable angina pectoris / DOE - PET/CT revealed coronary calcium (CAD) but normal perfusion in 2021; TTE is largely unremarkable; has symptoms more consistent with deconditioning at this time rather than angina; continues to smoke  - recommended symptom-limited exercise   - return precautions provided  - recommended regular CPAP use for OSA dx  - continue aspirin 81 mg once daily  - continue atorvastatin 80 mg once daily for known CAC     2. Essential (primary) hypertension - Remains well controlled on current therapy  - Continue hydrochlorothiazide 25 mg once daily   - continue lasix 20 mg once daily  - continue metoprolol succinate 100 mg once daily  - continue olmesartan-hydrochlorothiazide 40-25 mg once daily (pt taking additional 25 mg hydrochlorothiazide as described above)     3. Dyslipidemia  - Continue Lipitor 80 mg daily       Follow-up in 6 months       Please contact us  for any questions.         Kelli Pates, MD    I personally spent 30 minutes face-to-face and non-face-to-face in the care of this patient, which includes all pre, intra, and post visit time on the date of service.  All documented time was specific to the E/M visit and does not include any procedures that may have been performed.

## 2023-09-13 MED FILL — XOLAIR 300 MG/2 ML SUBCUTANEOUS AUTO-INJECTOR: SUBCUTANEOUS | 28 days supply | Qty: 2 | Fill #6

## 2023-10-05 NOTE — Unmapped (Signed)
 [facility] Special[facility] and Home Delivery Pharmacy Refill Coordination Note    Matthew Deleon, Matthew Deleon: [birth year]  Phone: [phone] (home)       All above HIPAA information was verified with patient.         10/05/2023     1:12 PM   Specialty Rx Medication Refill Questionnaire   Which Medications would you like refilled and shipped? xolair   Please list all current allergies: daypro   Have you missed any doses in the last 30 days? No   Have you had any changes to your medication(s) since your last refill? No   How many days remaining of each medication do you have at home? none   If receiving an injectable medication, next injection date is 10/12/2023   Have you experienced any side effects in the last 30 days? No   Please enter the full address (street address, city, state, zip code) where you would like your medication(s) to be delivered to. [address] [place] [address]   Please specify on which day you would like your medication(s) to arrive. Note: if you need your medication(s) within 3 days, please call the pharmacy to schedule your order at [phone]  10/11/2023   Has your insurance changed since your last refill? No   Would you like a pharmacist to call you to discuss your medication(s)? No   Do you require a signature for your package? (Note: if we are billing Medicare Part B or your order contains a controlled substance, we will require a signature) No   I have been provided my out of pocket cost for my medication and approve the pharmacy to charge the amount to my credit card on file. Yes   Additional Comments: thank you!         Completed refill call assessment today to schedule patient's medication shipment from the [facility] and [facility] Delivery Pharmacy [phone]).  All relevant notes have been reviewed.       Confirmed patient received a Conservation officer, historic buildings and a Surveyor, mining with first shipment. The patient will receive a drug information handout for each medication shipped and additional FDA Medication Guides as required.         REFERRAL TO PHARMACIST     Referral to the pharmacist: Not needed      [facility]     Shipping address confirmed in Epic.     Delivery Scheduled: Yes, Expected medication delivery date: 10/11/23.     Medication will be delivered via UPS to the prescription address in Epic WAM.    Kyra Myron   [facility] Special[facility] and Home Delivery Pharmacy Specialty Technician

## 2023-10-10 MED FILL — XOLAIR 300 MG/2 ML SUBCUTANEOUS AUTO-INJECTOR: SUBCUTANEOUS | 28 days supply | Qty: 2 | Fill #7

## 2023-10-28 DIAGNOSIS — J4489 Asthma-COPD overlap syndrome: Principal | ICD-10-CM

## 2023-10-28 DIAGNOSIS — J449 Chronic obstructive pulmonary disease, unspecified: Principal | ICD-10-CM

## 2023-10-28 MED ORDER — BREZTRI AEROSPHERE 160 MCG-9MCG-4.8MCG/ACTUATION HFA AEROSOL INHALER
0 refills | 0.00000 days
Start: 2023-10-28 — End: ?

## 2023-10-29 MED ORDER — BREZTRI AEROSPHERE 160 MCG-9MCG-4.8MCG/ACTUATION HFA AEROSOL INHALER
Freq: Two times a day (BID) | RESPIRATORY_TRACT | 5 refills | 0.00000 days | Status: CP
Start: 2023-10-29 — End: ?

## 2023-10-29 NOTE — Unmapped (Signed)
 Pharmacy requesting refill    Last visit: 07/11/2023  Next visit: 01/23/2024    RX routed to Dr. Phares for review    Requested Prescriptions     Pending Prescriptions Disp Refills    budesonide -glycopyr-formoterol  (BREZTRI  AEROSPHERE) 160-9-4.8 mcg/actuation inhaler [Pharmacy Med Name: BREZTRI  160-9-4.8 MCG/ACT  AER] 10.7 g 5     Sig: Inhale 2 puffs in the morning and 2 puffs in the evening.

## 2023-11-02 NOTE — Unmapped (Signed)
 [facility] Special[facility] and Home Delivery Pharmacy Refill Coordination Note    Matthew Deleon, [place]: 1960-05-14  Phone: [phone] (home)       All above HIPAA information was verified with patient.         11/01/2023     5:08 PM   Specialty Rx Medication Refill Questionnaire   Which Medications would you like refilled and shipped? Xolair    Please list all current allergies: daypro   Have you missed any doses in the last 30 days? No   Have you had any changes to your medication(s) since your last refill? No   How much of each medication do you have remaining at home? (eg. number of tablets, injections, etc.) none   If receiving an injectable medication, next injection date is 11/15/2023   Have you experienced any side effects in the last 30 days? No   Please enter the full address (street address, city, state, zip code) where you would like your medication(s) to be delivered to. [address] [place] [address]   Please specify on which day you would like your medication(s) to arrive. Note: if you need your medication(s) within 3 days, please call the pharmacy to schedule your order at [phone]  11/13/2023   Has your insurance changed since your last refill? No   Would you like a pharmacist to call you to discuss your medication(s)? No   Do you require a signature for your package? (Note: if we are billing Medicare Part B or your order contains a controlled substance, we will require a signature) No   I have been provided my out of pocket cost for my medication and approve the pharmacy to charge the amount to my credit card on file. Yes   Additional Comments: thank you         Completed refill call assessment today to schedule patient's medication shipment from the [facility] and [facility] Delivery Pharmacy [phone]).  All relevant notes have been reviewed.       Confirmed patient received a Conservation officer, historic buildings and a Surveyor, mining with first shipment. The patient will receive a drug information handout for each medication shipped and additional FDA Medication Guides as required.         REFERRAL TO PHARMACIST     Referral to the pharmacist: Not needed      [facility]     Shipping address confirmed in Epic.     Delivery Scheduled: Yes, Expected medication delivery date: 11/13/23.     Medication will be delivered via UPS to the prescription address in Epic WAM.    Matthew Deleon   [facility] Specialty and Home Delivery Pharmacy Specialty Technician

## 2023-11-12 MED FILL — XOLAIR 300 MG/2 ML SUBCUTANEOUS AUTO-INJECTOR: SUBCUTANEOUS | 28 days supply | Qty: 2 | Fill #8

## 2023-12-04 DIAGNOSIS — J4489 Asthma-COPD overlap syndrome: Principal | ICD-10-CM

## 2023-12-04 DIAGNOSIS — L501 Idiopathic urticaria: Principal | ICD-10-CM

## 2023-12-04 NOTE — Unmapped (Addendum)
 VISIT SUMMARY:  Today, you came in for a follow-up on your respiratory and allergic conditions, including asthma, COPD, and chronic hives. We discussed your current treatments and how they are working for you.    YOUR PLAN:  -CHRONIC URTICARIA: Chronic urticaria, also known as chronic hives, is a condition where you have itchy welts on your skin. Your condition is well-controlled with Xolair , which you receive every four weeks. You should continue taking Zyrtec  daily and add an extra dose if you experience more itching, especially after heat exposure. We may consider switching you to Dupixent, which is also approved for chronic hives, asthma, and COPD, depending on your pulmonologist's recommendation.    -CHRONIC RHINITIS: Chronic rhinitis is a condition where your nasal passages are inflamed, causing symptoms like congestion and postnasal drip. Your symptoms have improved, and you should continue using sinus rinses as needed. You can also use Azelastine  nasal spray if necessary.    -ASTHMA-COPD OVERLAP SYNDROME: Asthma-COPD overlap syndrome is a condition where you have symptoms of both asthma and chronic obstructive pulmonary disease (COPD). Your symptoms have improved with Breztri , which you take as two puffs twice a day. We will discuss the potential switch to Dupixent with your pulmonologist during your follow-up in October.

## 2023-12-04 NOTE — Unmapped (Signed)
 [facility]  [address]  5th Floor, [address]  [place], [place] [address]    Assessment and Plan:   Matthew Deleon is a 63 y.o. male that was seen in follow-up for the evaluation:  Assessment & Plan  Chronic urticaria  Chronic urticaria is well-controlled on Xolair  300 mg every four weeks. Occasional itching occurs after heat exposure, managed with additional Zyrtec  and showering. Discussed that should his pulmonologist desire to siwtch to Dupixent, this is now approved for chronic spontaneous urticaria and angioedema along with asthma and COPD.   - Continue Xolair  300 mg every four weeks.  - Take an additional Zyrtec  on days with increased itching.    Chronic rhinitis  Chronic rhinitis symptoms have improved with reduced nasal congestion and postnasal drip. He has stopped regular nasal spray use and occasionally uses sinus rinses.  - Continue sinus rinses as needed.  - Use Azelastine  nasal spray as needed.    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 Managed with Breztri , significantly improving symptoms, including reduced cough. IgE continues to be elevated despite Xolair  therapy along with sensitization to perennial and seasonal allergens on comprehensive environmental panel.    - Continue Breztri  two puffs twice a day.  - Follow up with pulmonologist in October for treatment of both asthma COPD overlap as well as obstructive sleep apnea  - In regards to biologic therapy, certainly Xolair  could be increased to asthma dosing of 375 mg every 2 weeks.  Alternatively, given elevated eosinophils and approval for both COPD and asthma, Dupixent would also be an option and is also now approved for chronic spontaneous urticaria and angioedema.    No orders of the defined types were placed in this encounter.    Return in about 4 months (around 04/04/2024) for Next scheduled follow up.    I personally spent over 30 minutes face-to-face and non-face-to-face in the care of this patient, which includes all pre, intra, and post visit time on the date of service.  All documented time was specific to the E/M visit and does not include any procedures that may have been performed.    Matthew Deleon, M.D.   Division of Allergy and Immunology    Subjective   Last Visit: 06/05/23    HISTORY OF PRESENT ILLNESS:  Matthew Deleon is a 63 y.o. male who presented for follow-up of chronic urticaria and angioedema. History is obtained from Matthew Deleon. A thorough review of the medical records was also performed.    History of Present Illness  Matthew Deleon is a 63 year old male with asthma, COPD, and chronic spontaneous urticaria and angioedema who presents for follow-up.    His chronic hives are well controlled with Xolair , administered every four weeks. He continues to take Zyrtec  daily and adds an extra dose for breakthrough symptoms. Heat exacerbates his itching, especially after mowing the grass, but relief is found with showers and additional Zyrtec . He experiences fatigue when taking multiple doses of Zyrtec  in a day.    He was previously referred to pulmonology, where he was diagnosed with asthma and COPD overlap. A CT scan of his chest was performed, and he was switched from Symbicort  to Breztri , which he takes as two puffs twice daily. He notes that he does not have as much sputum and is not experiencing nasal congestion or postnasal drip. He also states that Breztri  has helped with his cough. He has stopped using nasal sprays  but occasionally uses sinus rinses.    He has a history of obstructive sleep apnea and has not yet completed a sleep study. He finds CPAP machines uncomfortable, having tried both full masks and nasal ones, and reports poor sleep quality, often only sleeping well for two hours. He uses a watch to track his oxygen levels and sleep patterns.    Of note, he did recently start a GLP1 agonist which has resulted in a fair amount of weight loss, which is provided an overall sense of wellbeing.  He has gained some weight back due to his most recent gout exacerbation requiring systemic steroids.    Past Medical History:     Past Medical History:   Diagnosis Date    Allergies     Arthritis     Asthma (HHS-HCC)     GERD (gastroesophageal reflux disease)     Gout     Hyperlipidemia     Hypertension     Shortness of breath      Medications:     Current Outpatient Medications   Medication Sig Dispense Refill    albuterol  HFA 90 mcg/actuation inhaler Inhale 2 puffs every four (4) hours as needed for wheezing. 8.5 g 10    aspirin (ECOTRIN) 81 MG tablet Take 1 tablet (81 mg total) by mouth daily.      atorvastatin (LIPITOR) 80 MG tablet Take 1 tablet (80 mg total) by mouth daily.  1    azelastine  (ASTELIN ) 137 mcg (0.1 %) nasal spray 2 sprays into each nostril two (2) times a day. Use in each nostril as directed 30 mL 11    budesonide -glycopyr-formoterol  (BREZTRI  AEROSPHERE) 160-9-4.8 mcg/actuation inhaler Inhale 2 puffs in the morning and 2 puffs in the evening. 10.7 g 5    buPROPion (WELLBUTRIN XL) 300 MG 24 hr tablet Take 1 tablet (300 mg total) by mouth daily. (Patient not taking: Reported on 12/04/2023)  1    cetirizine  (ZYRTEC ) 10 MG tablet Take 2 tablets (20 mg total) by mouth two (2) times a day. (Patient taking differently: Take 2 tablets (20 mg total) by mouth daily.) 120 tablet 11    empty container Misc Use as directed to dispose of Xolair  syringes 1 each 2    EPINEPHrine  (EPIPEN ) 0.3 mg/0.3 mL injection Inject 0.3 mL (0.3 mg total) into the muscle once as needed for anaphylaxis for up to 1 dose. 2 each 2    febuxostat (ULORIC) 40 mg tablet Take 1 tablet (40 mg total) by mouth at noon. (Patient not taking: Reported on 12/04/2023)      furosemide (LASIX) 20 MG tablet Take 1 tablet (20 mg total) by mouth.      hydroCHLOROthiazide (HYDRODIURIL) 25 MG tablet Take 1 tablet (25 mg total) by mouth two (2) times a day. (Patient not taking: Reported on 12/04/2023)  1    LORazepam (ATIVAN) 2 MG tablet       metoprolol succinate (TOPROL-XL) 100 MG 24 hr tablet Take 1 tablet (100 mg total) by mouth daily.  1    olmesartan-hydrochlorothiazide (BENICAR HCT) 40-25 mg per tablet Take 1 tablet by mouth daily.      omalizumab  (XOLAIR ) 300 mg/2 mL auto-injector Inject the contents of 1 pen (300 mg) under the skin every twenty-eight (28) days. 2 mL 11    omeprazole (PRILOSEC) 20 MG capsule Take 1 capsule (20 mg total) by mouth daily.  3    predniSONE  (DELTASONE ) 10 mg tablet pack Take by mouth daily as needed.  tadalafiL (CIALIS) 5 MG tablet Take 1 tablet (5 mg total) by mouth daily as needed.       No current facility-administered medications for this visit.     Allergies:     Allergies   Allergen Reactions    Daypro [Oxaprozin] Anaphylaxis    Hydrocodone Bitartrate Hives    Levofloxacin Hives    Seroquel [Quetiapine]      Objective:   PHYSICAL EXAM:  Vitals: BP 95/69 (BP Site: L Arm, BP Position: Sitting)  - Pulse 76  - SpO2 91%   General : No apparent distress. Awake, alert, well appearing.  HEENT: Normocephalic, atraumatic. Mucous membranes are moist. No periorbital edema. Facial muscles move symmetrically.  Neck: Neck is symmetrical with trachea midline.  Eyes: PERRL. Eyelids and conjunctiva normal bilaterally.   Respiratory: Breathing is unlabored, no tachypnea.  Cardiovascular: No edema, no pallor, no cyanosis.  Abdomen: Non-distended.  Skin: No concerning rash or lesions noted on exposed skin.  Extremities: Normal range of motion observed. No peripheral edema.  Neuro: Mood and behavior appropriate for age.  Musculoskeletal: Symmetric and appropriate movements of extremities.

## 2023-12-06 NOTE — Unmapped (Signed)
 [facility] Clinical Assessment & Refill Coordination Note    Leston Schueller, DOB: [birth year]  Phone: [phone] (home)     All above HIPAA information was verified with patient.     Was a Nurse, learning disability used for this call? No    Specialty Medication(s):   Inflammatory Disorders: Xolair      Current Medications[1]     Changes to medications: Larue reports starting the following medications: semaglutide and colchicine  prn. Stopped hydrochlorothiazide, lasix, and febuxostat    Medication list has been reviewed and updated in Epic: Yes    Allergies[2]    Changes to allergies: No    Allergies have been reviewed and updated in Epic: Yes    SPECIALTY MEDICATION ADHERENCE     Xolair  300 mg/91mL: 0 doses of medicine on hand     Medication Adherence    Patient reported X missed doses in the last month: 0  Specialty Medication: Xolair  300 mg/2mL Q28d  Patient is on additional specialty medications: No  Patient is on more than two specialty medications: No  Any gaps in refill history greater than 2 weeks in the last 3 months: no  Demonstrates understanding of importance of adherence: yes  Informant: patient          Specialty medication(s) dose(s) confirmed: Regimen is correct and unchanged.     Are there any concerns with adherence? No    Adherence counseling provided? Not needed    CLINICAL MANAGEMENT AND INTERVENTION      Clinical Benefit Assessment:    Do you feel the medicine is effective or helping your condition? Yes    Clinical Benefit counseling provided? Progress note from 8/12 shows evidence of clinical benefit    Adverse Effects Assessment:    Are you experiencing any side effects? No    Are you experiencing difficulty administering your medicine? No    Quality of Life Assessment:    Quality of Life    Rheumatology  Oncology  Dermatology  Cystic Fibrosis          How many days over the past month did your idiopathic urticaria  keep you from your normal activities? For example, brushing your teeth or getting up in the morning. High heat sometimes triggers urticaria which limits outdoor exposure during high temp days    Have you discussed this with your provider? Yes    Acute Infection Status:    Acute infections noted within Epic:  No active infections    Patient reported infection: None    Therapy Appropriateness:    Is therapy appropriate based on current medication list, adverse reactions, adherence, clinical benefit and progress toward achieving therapeutic goals? Yes, therapy is appropriate and should be continued     Clinical Intervention:    Was an intervention completed as part of this clinical assessment? No    DISEASE/MEDICATION-SPECIFIC INFORMATION      For patients on injectable medications: Patient currently has 0 doses left.  Next injection is scheduled for 8/21.    Chronic Inflammatory Diseases: Have you experienced any flares in the last month? Yes, minor flares with high heat  Has this been reported to your provider? Yes, takes cetirizine  and takes a shower to reduce hives/flare    PATIENT SPECIFIC NEEDS     Does the patient have any physical, cognitive, or cultural barriers? No    Is the patient high risk? No    Does the patient require physician intervention or other additional services (i.e., nutrition,  smoking cessation, social work)? No    Does the patient have an additional or emergency contact listed in their chart? Yes    SOCIAL DETERMINANTS OF HEALTH     At the [facility], we have learned that life circumstances - like trouble affording food, housing, utilities, or transportation can affect the health of many of our patients.   That is why we wanted to ask: are you currently experiencing any life circumstances that are negatively impacting your health and/or quality of life? Patient declined to answer    Social Drivers of Health     Food Insecurity: Not on file   Tobacco Use: High Risk (12/04/2023)    Patient History     Smoking Tobacco Use: Every Day     Smokeless Tobacco Use: Never     Passive Exposure: Past   Transportation Needs: Not on file   Alcohol Use: Not on file   Housing: Not on file   Physical Activity: Not on file   Utilities: Not on file   Stress: Not on file   Interpersonal Safety: Not At Risk (07/11/2023)    Interpersonal Safety     Unsafe Where You Currently Live: No     Physically Hurt by Anyone: No     Abused by Anyone: No   Substance Use: Not on file (02/28/2023)   Intimate Partner Violence: Not At Risk (07/11/2023)    Humiliation, Afraid, Rape, and Kick questionnaire     Fear of Current or Ex-Partner: No     Emotionally Abused: No     Physically Abused: No     Sexually Abused: No   Social Connections: Not on file   Financial Resource Strain: Not on file   Health Literacy: Low Risk  (09/13/2021)    Health Literacy     : Never   Internet Connectivity: Not on file       Would you be willing to receive help with any of the needs that you have identified today? Not applicable       SHIPPING     Specialty Medication(s) to be Shipped:   Inflammatory Disorders: Xolair     Other medication(s) to be shipped: No additional medications requested for fill at this time    Specialty Medications not needed at this time: N/A     Changes to insurance: No    Cost and Payment: Patient has a $0 copay, payment information is not required.    Delivery Scheduled: Yes, Expected medication delivery date: 12/11/23.     Medication will be delivered via UPS to the confirmed prescription address in [facility].    The patient will receive a drug information handout for each medication shipped and additional FDA Medication Guides as required.  Verified that patient has previously received a Conservation officer, historic buildings and a Surveyor, mining.    The patient or caregiver noted above participated in the development of this care plan and knows that they can request review of or adjustments to the care plan at any time.      All of the patient's questions and concerns have been addressed.    Shelba DELENA Hummer, PharmD   [facility] Specialty Pharmacist       [1]   Current Outpatient Medications   Medication Sig Dispense Refill    albuterol  HFA 90 mcg/actuation inhaler Inhale 2 puffs every four (4) hours as needed for wheezing. 8.5 g 10    aspirin (ECOTRIN) 81 MG tablet  Take 1 tablet (81 mg total) by mouth daily.      atorvastatin (LIPITOR) 80 MG tablet Take 1 tablet (80 mg total) by mouth daily.  1    azelastine  (ASTELIN ) 137 mcg (0.1 %) nasal spray 2 sprays into each nostril two (2) times a day. Use in each nostril as directed 30 mL 11    budesonide -glycopyr-formoterol  (BREZTRI  AEROSPHERE) 160-9-4.8 mcg/actuation inhaler Inhale 2 puffs in the morning and 2 puffs in the evening. 10.7 g 5    colchicine  (COLCRYS ) 0.6 mg tablet Take 1 tablet (0.6 mg total) by mouth daily as needed (gout flares).      LORazepam (ATIVAN) 2 MG tablet       metoprolol succinate (TOPROL-XL) 100 MG 24 hr tablet Take 1 tablet (100 mg total) by mouth daily.  1    olmesartan-hydrochlorothiazide (BENICAR HCT) 40-25 mg per tablet Take 1 tablet by mouth daily.      omeprazole (PRILOSEC) 20 MG capsule Take 1 capsule (20 mg total) by mouth daily.  3    semaglutide, weight loss, (WEGOVY) 2.4 mg/0.75 mL injection pen Inject 0.75 mL (2.4 mg total) under the skin every seven (7) days.      buPROPion (WELLBUTRIN XL) 300 MG 24 hr tablet Take 1 tablet (300 mg total) by mouth daily. (Patient not taking: Reported on 12/04/2023)  1    cetirizine  (ZYRTEC ) 10 MG tablet Take 2 tablets (20 mg total) by mouth two (2) times a day. (Patient taking differently: Take 2 tablets (20 mg total) by mouth daily.) 120 tablet 11    empty container Misc Use as directed to dispose of Xolair  syringes 1 each 2    EPINEPHrine  (EPIPEN ) 0.3 mg/0.3 mL injection Inject 0.3 mL (0.3 mg total) into the muscle once as needed for anaphylaxis for up to 1 dose. 2 each 2    febuxostat (ULORIC) 40 mg tablet Take 1 tablet (40 mg total) by mouth at noon. (Patient not taking: Reported on 12/04/2023)      furosemide (LASIX) 20 MG tablet Take 1 tablet (20 mg total) by mouth. (Patient not taking: Reported on 12/06/2023)      hydroCHLOROthiazide (HYDRODIURIL) 25 MG tablet Take 1 tablet (25 mg total) by mouth two (2) times a day. (Patient not taking: Reported on 12/04/2023)  1    omalizumab  (XOLAIR ) 300 mg/2 mL auto-injector Inject the contents of 1 pen (300 mg) under the skin every twenty-eight (28) days. 2 mL 11    predniSONE  (DELTASONE ) 10 mg tablet pack Take by mouth daily as needed.      tadalafiL (CIALIS) 5 MG tablet Take 1 tablet (5 mg total) by mouth daily as needed.       No current facility-administered medications for this visit.   [2]   Allergies  Allergen Reactions    Daypro [Oxaprozin] Anaphylaxis    Hydrocodone Bitartrate Hives    Levofloxacin Hives    Seroquel [Quetiapine]

## 2023-12-10 MED FILL — XOLAIR 300 MG/2 ML SUBCUTANEOUS AUTO-INJECTOR: SUBCUTANEOUS | 28 days supply | Qty: 2 | Fill #9

## 2024-01-02 NOTE — Unmapped (Signed)
 [facility] Refill Coordination Note    Matthew Deleon, [name]: [birth year]  Phone: [phone] (home)       All above HIPAA information was verified with patient.         01/02/2024    11:21 AM   Specialty Rx Medication Refill Questionnaire   Which Medications would you like refilled and shipped? xolair    Please list all current allergies: daypro   Have you missed any doses in the last 30 days? No   Have you had any changes to your medication(s) since your last refill? No   How much of each medication do you have remaining at home? (eg. number of tablets, injections, etc.) none   If receiving an injectable medication, next injection date is 01/11/2024   Have you experienced any side effects in the last 30 days? No   Please enter the full address (street address, city, state, zip code) where you would like your medication(s) to be delivered to. [address] [place] [address]   Please specify on which day you would like your medication(s) to arrive. Note: if you need your medication(s) within 3 days, please call the pharmacy to schedule your order at [phone]  01/11/2024   Has your insurance changed since your last refill? No   Would you like a pharmacist to call you to discuss your medication(s)? No   Do you require a signature for your package? (Note: if we are billing Medicare Part B or your order contains a controlled substance, we will require a signature) No   I have been provided my out of pocket cost for my medication and approve the pharmacy to charge the amount to my credit card on file. Yes   Additional Comments: can you send me a box of bandaids.         Completed refill call assessment today to schedule patient's medication shipment from the [facility] [phone]).  All relevant notes have been reviewed.       Confirmed patient received a Conservation officer, historic buildings and a Surveyor, mining with first shipment. The patient will receive a drug information handout for each medication shipped and additional FDA Medication Guides as required.         REFERRAL TO PHARMACIST     Referral to the pharmacist: Not needed      Matthew Deleon     Shipping address confirmed in Epic.     Delivery Scheduled: Yes, Expected medication delivery date: 01/11/24.     Medication will be delivered via UPS to the prescription address in Epic [facility].    Matthew Deleon   [facility] Specialty Technician

## 2024-01-10 MED FILL — XOLAIR 300 MG/2 ML SUBCUTANEOUS AUTO-INJECTOR: SUBCUTANEOUS | 28 days supply | Qty: 2 | Fill #10

## 2024-01-22 NOTE — Unmapped (Signed)
 Pulmonary Clinic -follow-up visit    Referring Physician :  Blane Vincente Allan  PCP:     Leonce Lucie Amble, PA  Reason for Consult:   Shortness of breath/cough with mucus    HISTORY:     History of Present Illness:  Mr. Bjorkman is a 63 y.o. male with a history of CAD with stable angina, HTN, HLD, tobacco use, alcohol use, obesity, OSA, chronic urticaria, chronic rhinitis,  whom we are seeing in consultation requested by Blane Vincente Allan for evaluation of Shortness of Breath.    Patient is a very pleasant 63 year old male, with above-mentioned history, with history of childhood asthma, chronic urticaria on Xolair , obesity, sleep apnea comes in for evaluation and management of his shortness of breath.    Patient mentions that he had childhood asthma, which was quite severe, at some point requiring nebulization and visits to the urgent care in the emergency rooms.  However he notes not remembering being admitted or requiring ventilation for the same.  He notes that he grew out of it over time.  However he started noticing that he had a lot of allergies to environmental allergens such as ragweed, cat dander to the point that it was getting uncontrolled with chest medicines.  At 1 point he was doing Zyrtec  morning and evening.  Eventually he was switched to hydroxychloroquine  and then to Xolair  shots in December 2023 since then his symptoms have been very well-controlled.  He was also put on Symbicort  for his asthma 2 puffs twice a day however notes that his shortness of breath is not very well-controlled.  He can walk a block but then has to stop afterwards, he used to be able to climb many flights of stairs but now has to take a break after 1 or 2 flights.  He notes mucus and cough during bedtime and then in the morning which is mucoid in nature.  He tried the Flonase  but that did not help.  He takes omeprazole regularly every day as he he has had heartburn and reflux disease for many years.    He denies any active wheezing but notes that when his shortness of breath is uncontrolled, he has chest tightness and wheezing.    He was also diagnosed with sleep apnea many years ago and used to see a sleep doctor but he was extremely uncomfortable using the machine and does not want to use it anymore.    History from chart review:  Sees allergist regularly - on xolair , since dec 2023, pruritus and urticaria well controlled on it.       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    Allergy: Mostly in the fall season with ragweed, cat dander    Exposure history: Asbestos as noted above, smoking history.  Lives in a trailer that was built  in 1993.    Pets/birds: Used to have a dog before, otherwise currently no pets or birds.    Social history: Used to be an avid traveler with cruises however reduced since wife expired in 2019, started traveling again now.  Also uses alcohol, sixpack a day, 16 ounces each which is cut down from whole case which had 12 beers every day.  Does not drink hard liquor.    Family History: Has 2 daughters, 1 Works as a Sport and exercise psychologist and the other 1 is a Scientist, water quality.  No significant lung disease in the family.  Father had some asbestos related lung disease however he is not sure what it was    Sleep history: Diagnosed with sleep apnea in 2013, report below currently, does not note central headaches or waking up in the middle of the night due to apnea events but has to wake up in the middle of the night due to urinary urgency.    Interval history 01/23/2024:  - Absolute eos 0.4  - Allergy profile positive for cat dander, dust mites, dog dander, total IgE very high more than 2000  - Low-dose CT in April 2025 with no suspicious pulmonary nodules, smoking-related lung disease with emphysema noted  - Overnight oximetry noted oxygen levels below 88% for more than 3 hours.  MyChart message sent regarding use of oxygen versus sleep study however patient did not respond  -Patient saw allergy in August 2025: Reported to continue on Xolair  for now.  - Recently started on GLP-1 agonist and has resulted in further amount of weight loss  -  Patient comes in today for follow-up.  Overall doing well.  Last close to 25 pounds weight.  Is now down to a low-dose blood pressure pill compared to 3 to 4 pills before.  Notes that his breathing is also doing well.  He occasionally has gout flares for which he gets prednisone  and he feels much more energy than however currently, continues to do Breztri  2 puffs twice a day.  Not had any flares or nebulization needs or urgent care visits or ER visits in the last 4 to 5 months since I have last known him.  Notes that he continues to do things he can, mows grass for himself, does not cook much but continues to have a protein shake every day.  He has a watch that records his oxygen numbers during the daytime and nighttime as well.  Still continues to smoke however is now down to half pack a day.      Past Medical History:  Past Medical History:   Diagnosis Date    Allergies     Arthritis     Asthma (HHS-HCC)     GERD (gastroesophageal reflux disease)     Gout     Hyperlipidemia     Hypertension     Shortness of breath      Past Surgical History:   Procedure Laterality Date    KNEE SURGERY Bilateral 08/2011    right knee/meniscus    ROTATOR CUFF REPAIR  03/07/2018 Other History:  The social history and family history were personally reviewed and updated in the patient's electronic medical record.    Family History   Problem Relation Age of Onset    Heart disease Mother     Breast cancer Mother     Pulmonary fibrosis Father     Coronary artery disease Brother     Heart attack Brother     Esophageal  cancer Neg Hx     Colon cancer Neg Hx     Glaucoma Neg Hx     Macular degeneration Neg Hx     Retinal detachment Neg Hx      Social History     Socioeconomic History    Marital status: Widowed   Tobacco Use    Smoking status: Every Day     Current packs/day: 0.50     Average packs/day: 0.5 packs/day for 35.7 years (17.9 ttl pk-yrs)     Types: Cigarettes     Start date: 1990     Passive exposure: Past    Smokeless tobacco: Never   Substance and Sexual Activity    Alcohol use: Yes     Alcohol/week: 2.0 standard drinks of alcohol     Types: 2 Cans of beer per week     Comment: 3-8 weekly    Drug use: Never   Social History Narrative    He lives in [place], [place]. He is a widower. His daughter and grandson live with him. He is an Journalist, newspaper.  He smokes 1/2 a pack daily x 5 years. He smoked 1-2 packs a day for about 30 years and then quit for 10 years.  He drinks about 6 beers daily. He denies illicit drug use.       Home Medications:  Current Outpatient Medications on File Prior to Visit   Medication Sig Dispense Refill    albuterol  HFA 90 mcg/actuation inhaler Inhale 2 puffs every four (4) hours as needed for wheezing. 8.5 g 10    aspirin (ECOTRIN) 81 MG tablet Take 1 tablet (81 mg total) by mouth daily.      atorvastatin (LIPITOR) 80 MG tablet Take 1 tablet (80 mg total) by mouth daily.  1    azelastine  (ASTELIN ) 137 mcg (0.1 %) nasal spray 2 sprays into each nostril two (2) times a day. Use in each nostril as directed 30 mL 11    budesonide -glycopyr-formoterol  (BREZTRI  AEROSPHERE) 160-9-4.8 mcg/actuation inhaler Inhale 2 puffs in the morning and 2 puffs in the evening. 10.7 g 5 buPROPion (WELLBUTRIN XL) 300 MG 24 hr tablet Take 1 tablet (300 mg total) by mouth daily. (Patient not taking: Reported on 12/04/2023)  1    cetirizine  (ZYRTEC ) 10 MG tablet Take 2 tablets (20 mg total) by mouth two (2) times a day. (Patient taking differently: Take 2 tablets (20 mg total) by mouth daily.) 120 tablet 11    colchicine  (COLCRYS ) 0.6 mg tablet Take 1 tablet (0.6 mg total) by mouth daily as needed (gout flares).      empty container Misc Use as directed to dispose of Xolair  syringes 1 each 2    EPINEPHrine  (EPIPEN ) 0.3 mg/0.3 mL injection Inject 0.3 mL (0.3 mg total) into the muscle once as needed for anaphylaxis for up to 1 dose. 2 each 2    febuxostat (ULORIC) 40 mg tablet Take 1 tablet (40 mg total) by mouth at noon. (Patient not taking: Reported on 12/04/2023)      furosemide (LASIX) 20 MG tablet Take 1 tablet (20 mg total) by mouth. (Patient not taking: Reported on 12/06/2023)      hydroCHLOROthiazide (HYDRODIURIL) 25 MG tablet Take 1 tablet (25 mg total) by mouth two (2) times a day. (Patient not taking: Reported on 12/04/2023)  1    LORazepam (ATIVAN) 2 MG tablet       metoprolol succinate (TOPROL-XL) 100 MG 24 hr tablet Take 1 tablet (100 mg total)  by mouth daily.  1    olmesartan-hydrochlorothiazide (BENICAR HCT) 40-25 mg per tablet Take 1 tablet by mouth daily.      omalizumab  (XOLAIR ) 300 mg/2 mL auto-injector Inject the contents of 1 pen (300 mg) under the skin every twenty-eight (28) days. 2 mL 11    omeprazole (PRILOSEC) 20 MG capsule Take 1 capsule (20 mg total) by mouth daily.  3    predniSONE  (DELTASONE ) 10 mg tablet pack Take by mouth daily as needed.      semaglutide, weight loss, (WEGOVY) 2.4 mg/0.75 mL injection pen Inject 0.75 mL (2.4 mg total) under the skin every seven (7) days.      tadalafiL (CIALIS) 5 MG tablet Take 1 tablet (5 mg total) by mouth daily as needed.       No current facility-administered medications on file prior to visit.       Allergies:  Allergies as of 01/23/2024 - Reviewed 12/06/2023   Allergen Reaction Noted    Daypro [oxaprozin] Anaphylaxis 09/24/2019    Hydrocodone bitartrate Hives 06/03/2014    Levofloxacin Hives 01/30/2011    Seroquel [quetiapine]  01/08/2020       Review of Systems:  A comprehensive review of systems was completed and negative except as noted in HPI.    PHYSICAL EXAM:   BP 99/68 (BP Site: L Arm, BP Position: Sitting, BP Cuff Size: Large)  - Pulse 92  - Temp 36.6 ??C (97.9 ??F) (Temporal)  - Wt (!) 114.8 kg (253 lb)  - SpO2 96%  - BMI 39.63 kg/m??     Gen: Patient is awake, alert, oriented to time place and person, no pallor, icterus,  cyanosis or clubbing.  Eyes: Pupils equal round and reacting light bilaterally  Head neck ENT: No JVD, no lymphadenopathy-cervical or supraclavicular, no thyromegaly, moist mucosa, normal oropharynx  CVS: S1-S2 heard normally, no murmurs appreciated, no rubs or gallops  Respiratory: Air entry equal bilaterally without any crackles or wheeze  Abdomen: Soft, nontender, nondistended, no organomegaly, bowel sounds present  Neuro: Nonfocal neurological exam, no sensory deficits, cranial nerves grossly intact  Skin extremities: No rash, no pedal edema     LABORATORY and RADIOLOGY DATA:     Pulmonary Function Tests/Interpretation:    07/11/2023          06/05/2023        Pertinent Laboratory Data:    Lab Results   Component Value Date    WBC 7.9 07/11/2023    HGB 15.4 07/11/2023    HCT 44.9 07/11/2023    PLT 148 (L) 07/11/2023       Lab Results   Component Value Date    NA 137 07/11/2023    K 4.1 07/11/2023    CL 98 07/11/2023    CO2 28.3 07/11/2023    BUN 24 (H) 07/11/2023    CREATININE 1.28 (H) 07/11/2023    GLU 120 07/11/2023    CALCIUM 9.7 07/11/2023    MG 1.3 (L) 06/06/2022       Lab Results   Component Value Date    BILITOT 0.8 07/11/2023    PROT 7.1 07/11/2023    ALBUMIN 3.9 07/11/2023    ALT 72 (H) 07/11/2023    AST 58 (H) 07/11/2023    ALKPHOS 58 07/11/2023      Latest Reference Range & Units 06/06/22 08:51 11/17/22 12:45   Absolute Eosinophils 0.0 - 0.4 10*9/L 0.3 0.5 (H)   (H): Data is abnormally high   Latest Reference Range &  Units 07/11/23 11:31   Absolute Eosinophils 0.0 - 0.5 10*9/L 0.4        Latest Reference Range & Units 10/21/21 12:29   Alpha-gal IgE <0.1 kUA/L 10.9 (H)   (H): Data is abnormally high     Latest Reference Range & Units 07/11/23 11:31   Absolute Eosinophils 0.0 - 0.5 10*9/L 0.4   Cat dander IgE <0.35 kUA/L 7.00 (H)   D. farinae IgE <0.35 kUA/L 9.69 (H)   D. pteronyssinus IgE <0.35 kUA/L 6.84 (H)   Dog Dander IgE <0.35 kUA/L 5.59 (H)   Giant Ragweed IgE <0.35 kUA/L 0.49 (H)   Ragweed, short (common) IgE <0.35 kUA/L 1.54 (H)   Total IgE 3 - 48 IU/mL 1678 (H)   IgE, Total 2-214 IU/mL IU/mL 2,435 (H)   (H): Data is abnormally high      Pertinent Imaging Data:    Low-dose CT April 2025:  LUNGS/AIRWAYS/PLEURA: Trachea and large airways are patent. Mild centrilobular and mild paraseptal emphysema. Lungs are clear. No suspicious pulmonary nodules. No pleural effusion or pneumothorax.    ECG: Sinus rhythm with right bundle branch block; unchanged    Echo: 12/2019  Summary    1. The left ventricle is normal in size with mildly increased wall thickness.    2. The left ventricular systolic function is normal, LVEF is visually estimated at 60-65%.    3. There is grade I diastolic dysfunction (impaired relaxation).    4. The mitral valve leaflets are mildly thickened with normal leaflet mobility.    5. Mitral annular calcification is present (mild).    6. The aortic valve is trileaflet with mildly thickened leaflets with normal excursion.    7. The left atrium is mildly dilated in size.    8. The right ventricle is normal in size, with normal systolic function.    Stress test: PET/CT 12/2019  IMPRESSIONS:  - Normal myocardial perfusion study  - No evidence of any significant ischemia or scar  - Perfusion images demonstrate homogeneous tracer distribution throughout the myocardium with no stress induced perfusion abnormalities.  - Left ventricular systolic function is normal. Post stress the ejection fraction is > 60%.  - Coronary calcifications are noted    Sleep Study 2013: OXYGEN DATA: The desaturation index was 17 events per hour. The lowest  desaturation was 82%. He spent 1.6 minutes with a saturation less than  88%. Moderate obstructive sleep apnea with predominant hypopneas causing  sleep fragmentation and oxygen desaturation. This seems to be corrected by a CPAP of 12 cm with a full-face  mask. Titration was optimal.       ASSESSMENT and PLAN     Jaxzen Vanhorn is a 63 y.o. male with morbid obesity, childhood asthma, chronic urticaria on Xolair , tobacco use, comes in for evaluation of his shortness of breath despite being on Symbicort  as well as mucoid expectoration.    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    Patient's asthma is highly likely allergic phenotype in nature.  We will get some basic allergy labs including ABPA panel and IgE for evaluation.  Despite being on Xolair , if his IgE is still elevated this might be something I can discuss with his allergist to see if  there can be other biologic options that can be used he did have high eosinophils 1 year ago, therefore I would like to phenotype and look for those as well.    Patient COPD is currently Gold 1E, given FEV1 is 86% and his eosinophils are more than 300.  His mMRC is grade 2 and on worst days grade 3.    Asthma-COPD overlap  -Phenotype asthma -allergic and eosinophilic and very high IgE  -Gold COPD 1 E  - currently well-controlled on Breztri  2 puffs twice a day.  Gargling instructions since new spacer given in clinic.  Refills provided in clinic.  -For now, given his respiratory symptoms are well-controlled on Xolair  and Breztri , as well as his chronic urticaria, I would not like him to switch unless he has a respiratory/dermatologic/allergic need to be switched, then we can go to [facility].    Oxygen Requirement and sleep apnea:  The patient is not on home oxygen therapy. at rest and is not on home oxygen therapy. with exercise.  DLCO is more than 100%.  I do not anticipate exertional hypoxemia however given his sleep apnea which is not controlled, I we did an overnight oximetry which showed that he spent more than 3 hours with oxygen saturations less than 88% and 22 minutes continuously.  I discussed with him the effects of sleep apnea and showed him that he is having high hemoglobin because of that.  He has tried a mask fullface, nasal pillow however did not feel very great about it despite using it for a month.  For now, he would want to hold off on going to sleep specialist but is okay to repeat a sleep study given he has lost close to 30 pounds weight.  Therefore I am ordering sleep study again.    Chronic NIV/chronic hypercapnia:  Given DLCO is not low, I do not anticipate hypercapnia, prior bicarb levels are nonelevated as well.  Will hold off on ABG testing at this stage    Chronic macrolide and steroid use:  Currently not indicated      Health Maintenance:  Patient will get the flu shot today in clinic.  Most Recent Immunizations   Administered Date(s) Administered    COVID-19 VACCINE,MRNA(MODERNA)(PF) 04/13/2020    INFLUENZA VACCINE IIV3(IM)(PF)6 MOS UP 02/20/2023    PCV21 (Capvaxive) (Pneumococcal 21-valent Conjugate Vaccine) 07/11/2023       Smoking Cessation:  The patient is currently a smoker. The patient has been instructed to discontinue using tobacco products, and counseled on benefits of smoking cessation. Information regarding tobacco cessation programs has been made available to the patient. Pulmonary rehab:  Currently not indicated.     Lung Cancer Screening:  The USPSTF recommends annual screening for lung cancer with low-dose computed tomography in adults ages 29 to 63 years who have a 20 pack-year smoking history and currently smoke or have quit within the past 15 years. Screening should be discontinued once a person has not smoked for 15 years or develops a health problem that substantially limits life expectancy or the ability or willingness to have curative lung surgery.    Mr.Perry does meet criteria for lung cancer screening according to the USPSTF guidelines.     Low-dose CT completed in April 2025: Lung RADS 1S.  Next 1 will be in April 2026.  Order will be placed today       Diagnoses and all orders for this visit:    Chronic obstructive pulmonary disease, unspecified COPD type    (  CMS-HCC)  -     budesonide -glycopyr-formoterol  (BREZTRI  AEROSPHERE) 160-9-4.8 mcg/actuation inhaler; Inhale 2 puffs in the morning and 2 puffs in the evening. Use with spacer and gargle after each use.    Asthma-COPD overlap syndrome    (CMS-HCC)  -     budesonide -glycopyr-formoterol  (BREZTRI  AEROSPHERE) 160-9-4.8 mcg/actuation inhaler; Inhale 2 puffs in the morning and 2 puffs in the evening. Use with spacer and gargle after each use.    Sleep apnea, unspecified type  -     Polysomnography (with CPAP); Future    Cigarette nicotine dependence without complication  -     CT Lung Cancer Screening Baseline or Annual Low Dose; Future    Encounter for screening for lung cancer  -     CT Lung Cancer Screening Baseline or Annual Low Dose; Future    Other orders  -     INFLUENZA VACCINE IIV3(IM)(PF)6 MOS UP      Plan of care was discussed with the patient who acknowledged understanding and is in agreement.    Patient will return to clinic in 8 months or sooner if needed.    RR:Ryjwryjoizze Vincente Allan, Leonce Lukes [place], [place]    Pauline Pane, MD  Assistant Professor, Pulmonary and Critical Care  Pager: [phone]  January 23, 2024 10:45 AM       I am located on-site and the patient is located on-site for this visit.

## 2024-01-23 ENCOUNTER — Ambulatory Visit: Admit: 2024-01-23 | Discharge: 2024-01-23 | Payer: BLUE CROSS/BLUE SHIELD

## 2024-01-23 DIAGNOSIS — Z122 Encounter for screening for malignant neoplasm of respiratory organs: Principal | ICD-10-CM

## 2024-01-23 DIAGNOSIS — G4730 Sleep apnea, unspecified: Principal | ICD-10-CM

## 2024-01-23 DIAGNOSIS — J4489 Asthma-COPD overlap syndrome    (CMS-HCC): Principal | ICD-10-CM

## 2024-01-23 DIAGNOSIS — J449 Chronic obstructive pulmonary disease, unspecified: Principal | ICD-10-CM

## 2024-01-23 DIAGNOSIS — F17210 Nicotine dependence, cigarettes, uncomplicated: Principal | ICD-10-CM

## 2024-01-23 MED ORDER — BREZTRI AEROSPHERE 160 MCG-9MCG-4.8MCG/ACTUATION HFA AEROSOL INHALER
Freq: Two times a day (BID) | RESPIRATORY_TRACT | 11 refills | 0.00000 days | Status: CP
Start: 2024-01-23 — End: ?

## 2024-01-23 NOTE — Unmapped (Signed)
 You were seen in clinic today by Dr Phares     Keep doing the breztri  2 puffs twice a day     We dont need to change the shots right now. We can in future to dupixent if we need to       We will call you with abnormal results if any labs or imaging ordered. We would encourage you to use MyChart.  Please call the clinic if you have any further questions or call 911 for emergency.  Thank you for giving me the opportunity to manage your health.    If a sleep study was ordered call [facility] [phone]     If sleep clinic referral was made, call [facility] SLEEP/Neurology clinic  [phone] to schedule this appointment.    If any imaging studies were ordered, if you are not contacted by the schedulers in 7-10 days, I would recommend calling [phone] to reach the imaging schedulers.    [facility] PHARMACY [phone]  or [phone]    [facility] CARE specialists: [phone]     Between appointments, you can reach us  at these numbers:    For appointments or the Pulmonary Nurse: Call Inocente Crosser, COPD Clinical nurse: [phone])     [phone]  Fax: [phone] / [phone]    For nurse line call [phone] and tell them you are my patient.    For urgent issues after hours:    Hospital Operator: [phone], ask for Pulmonary Fellow on call        I don't have a MyChart. Why should I get one?   - It's encrypted, so your information is secure  - It's a quick, easy way to contact the care team, manage appointments, see test results, and more!    How do I sign-up for MyChart?   - Download the MyChart app from the [employer] or [employer] and sign-up in the app  - Sign-up online at [email or web address]        Please note that if your next visit is scheduled after October 23, 2023, we may need to contact you to adjust your visit date/time if the doctor's schedule changes. We will do this as far in advance as possible to avoid any inconvenience on your schedule.      Your COPD Action Plan      Usual activity and exercise level. I sometimes have trouble breathing that improves with my medications (rescue inhaler or nebulizer). Usual amounts of cough and phlegm/mucus. Sleeping well at night.    Action:   Take below medications  Avoid cigarette smoke, inhaled irritants, and sick contacts  Use oxygen (if prescribed)  Continue regular exercise/diet plan    Your scheduled COPD medication is:  Other: Breztri     Your COPD medication to take as needed if you become short of breath is:  Albuterol  MDI (ProAir , Proventil , Ventolin , Xopenex)         I have any of the following complaints:  My cough is more frequent or severe  I am more short of breath than usual  I have more or thicker/greener mucus    Action:  Call Inocente in pulmonary office [phone]) to discuss possible treatment with antibiotics or steroids.    If your breathing is not improved after 2-3 days or your symptoms worsen, call Inocente for a reassessment. If it the weekend or holiday, call the hospital operator [phone]) and ask for the Pulmonary Fellow On Call  I have any of the following:   Very short of breath even at rest  Confusion or difficulty staying awake  Coughing up blood  Chest pain  New or worsening leg swelling   Unable to eat or take care of myself    Action:   CALL 911 or have someone drive you to the closest emergency department.  Bring your medicines with you  Use your rescue inhaler while seeking help every 2 hours if needed  Use oxygen (if prescribed)         Chronic Obstructive Pulmonary Disease (COPD): Care Instructions  Your Care Instructions     Chronic obstructive pulmonary disease (COPD) is a general term for a group of lung diseases, including emphysema and chronic bronchitis. People with COPD have decreased airflow in and out of the lungs, which makes it hard to breathe. The airways also can get clogged with thick mucus. Cigarette smoking is a major cause of COPD.  Although there is no cure for COPD, you can slow its progress. Following your treatment plan and taking care of yourself can help you feel better and live longer.  Follow-up care is a key part of your treatment and safety. Be sure to make and go to all appointments, and call your doctor if you are having problems. It???s also a good idea to know your test results and keep a list of the medicines you take.  How can you care for yourself at home?  Staying healthy  Do not smoke. This is the most important step you can take to prevent more damage to your lungs. If you need help quitting, talk to your doctor about stop-smoking programs and medicines. These can increase your chances of quitting for good.  Avoid colds and flu. Get a pneumococcal vaccine shot. If you have had one before, ask your doctor whether you need a second dose. Get the flu vaccine every fall. If you must be around people with colds or the flu, wash your hands often.  Avoid secondhand smoke, air pollution, and high altitudes. Also avoid cold, dry air and hot, humid air. Stay at home with your windows closed when air pollution is bad.  Medicines and oxygen therapy  Take your medicines exactly as prescribed. Call your doctor if you think you are having a problem with your medicine.  You may be taking medicines such as:  Bronchodilators. These help open your airways and make breathing easier. Bronchodilators are either short-acting (work for 6 to 9 hours) or long-acting (work for 24 hours). You inhale most bronchodilators, so they start to act quickly. Always carry your quick-relief inhaler with you in case you need it while you are away from home.  Corticosteroids (prednisone , budesonide ). These reduce airway inflammation. They come in pill or inhaled form. You must take these medicines every day for them to work well.  A spacer may help you get more inhaled medicine to your lungs. Ask your doctor or pharmacist if a spacer is right for you. If it is, ask how to use it properly.  Do not take any vitamins, over-the-counter medicine, or herbal products without talking to your doctor first.  If your doctor prescribed antibiotics, take them as directed. Do not stop taking them just because you feel better. You need to take the full course of antibiotics.  Oxygen therapy boosts the amount of oxygen in your blood and helps you breathe easier. Use the flow rate your doctor has recommended, and do not change it without  talking to your doctor first.  Activity  Get regular exercise. Walking is an easy way to get exercise. Start out slowly, and walk a little more each day.  Pay attention to your breathing. You are exercising too hard if you cannot talk while you are exercising.  Take short rest breaks when doing household chores and other activities.  Learn breathing methods--such as breathing through pursed lips--to help you become less short of breath.  If your doctor has not set you up with a pulmonary rehabilitation program, talk to him or her about whether rehab is right for you. Rehab includes exercise programs, education about your disease and how to manage it, help with diet and other changes, and emotional support.  Diet  Eat regular, healthy meals. Use bronchodilators about 1 hour before you eat to make it easier to eat. Eat several small meals instead of three large ones. Drink beverages at the end of the meal. Avoid foods that are hard to chew.  Eat foods that contain fat and protein so that you do not lose weight and muscle mass. These foods include ice cream, pudding, cheese, eggs, and peanut butter.  Use less salt. Too much salt can cause you to retain fluids, which makes it harder to breathe. Do not add salt while you are cooking or at the table. Eat fewer processed foods and foods from restaurants, including fast foods. Use fresh or frozen foods instead of canned foods.  Mental health  Talk to your family, friends, or a therapist about your feelings. It is normal to feel frightened, angry, hopeless, helpless, and even guilty. Talking openly about bad feelings can help you cope. If these feelings last, talk to your doctor.  When should you call for help?  Call 911 anytime you think you may need emergency care. For example, call if:  You have severe trouble breathing.  Call your doctor now or seek immediate medical care if:  You have new or worse trouble breathing.  You cough up blood.  You have a fever.  Watch closely for changes in your health, and be sure to contact your doctor if:  You cough more deeply or more often, especially if you notice more mucus or a change in the color of your mucus.  You have new or worse swelling in your legs or belly.  You are not getting better as expected.  Where can you learn more?  Go to CurvePoint.com.pt  ?? 2006-2016 Healthwise, [employer]. Care instructions adapted under license by [facility]. This care instruction is for use with your licensed healthcare professional. If you have questions about a medical condition or this instruction, always ask your healthcare professional. Healthwise, Incorporated disclaims any warranty or liability for your use of this information.  Content Version: 10.9.538570; Current as of: December 12, 2013     COPD Exacerbation Plan: Care Instructions     Your Care Instructions   If you have chronic obstructive pulmonary disease (COPD), your usual shortness of breath could suddenly get worse. You may start coughing more and have more mucus. This flare-up is called a COPD exacerbation (say [id number]-ZAS-[id number]-BAY-[id number]).   A lung infection or air pollution could set off an exacerbation. Sometimes it can happen after a quick change in temperature or being around chemicals.   Work with your doctor to make a plan for dealing with an exacerbation. You can better manage it if you plan ahead.     Follow-up care is a key part of your treatment  and safety. Be sure to make and go to all appointments, and call your doctor if you are having problems. It's also a good idea to know your test results and keep a list of the medicines you take.     How can you care for yourself at home?   During an exacerbation   Do not panic if you start to have one. Quick treatment at home may help you prevent serious breathing problems. If you have a COPD exacerbation plan that you developed with your doctor, follow it.   Take your medicines exactly as your doctor tells you.   Use your inhaler as directed by your doctor. If your symptoms do not get better after you use your medicine, have someone take you to the emergency room. Call an ambulance if necessary.   With inhaled medicines, a spacer or a nebulizer may help you get more medicine to your lungs. Ask your doctor or pharmacist how to use them properly. Practice using the spacer in front of a mirror before you have an exacerbation. This may help you get the medicine into your lungs quickly.   If your doctor has given you steroid pills, take them as directed.   Your doctor may have given you a prescription for antibiotics, which you are to fill if you need it. Call your doctor if you use the prescription.   Talk to your doctor if you have any problems with your medicine.    Preventing an exacerbation   Do not smoke. This is the most important step you can take to prevent more damage to your lungs and prevent problems. If you already smoke, it is never too late to stop. If you need help quitting, talk to your doctor about stop-smoking programs and medicines. These can increase your chances of quitting for good.   Take your daily medicines as prescribed.   Avoid colds and flu.   Get a pneumococcal vaccine.   Get a flu vaccine each year, as soon as it is available. Ask those you live or work with to do the same, so they will not get the flu and infect you.   Try to stay away from people with colds or the flu.   Wash your hands often.  Avoid secondhand smoke; air pollution; cold, dry air; hot, humid air; and high altitudes. Stay at home with your windows closed when air pollution is bad.   Learn breathing techniques for COPD, such as breathing through pursed lips. These techniques can help you breathe easier during an exacerbation.    When should you call for help?   Call 911 anytime you think you may need emergency care. For example, call if:   You have severe trouble breathing.   You have severe chest pain.  Call your doctor now or seek immediate medical care if:   You have new or worse shortness of breath.   You develop new chest pain.   You are coughing more deeply or more often, especially if you notice more mucus or a change in the color of your mucus.   You cough up blood.   You have new or increased swelling in your legs or belly.   You have a fever.  Watch closely for changes in your health, and be sure to contact your doctor if:   You use your antibiotic prescription.   Your symptoms are getting worse.    Where can you learn more?   Go to https://myuncchart.org  Enter ([phone] [phone] in the search box to learn more about COPD Exacerbation Plan: Care Instructions.   ?? 2006-2016 Healthwise, Inc[employer]. Care instructions adapted under license by [facility]. This care instruction is for use with your licensed healthcare professional. If you have questions about a medical condition or this instruction, always ask your healthcare professional. Healthwise, Incorporated disclaims any warranty or liability for your use of this information.   Content Version: 10.9.538570; Current as of: December 12, 2013

## 2024-01-31 NOTE — Unmapped (Signed)
 [facility] Specialty and Home Delivery Pharmacy Refill Coordination Note    Matthew Deleon, [name]: 1960-08-13  Phone: [phone] (home)       All above HIPAA information was verified with patient.         01/30/2024    10:38 AM   Specialty Rx Medication Refill Questionnaire   Which Medications would you like refilled and shipped? xolair    Please list all current allergies: daypro   Have you missed any doses in the last 30 days? No   Have you had any changes to your medication(s) since your last refill? No   How much of each medication do you have remaining at home? (eg. number of tablets, injections, etc.) none   If receiving an injectable medication, next injection date is 02/08/2024   Have you experienced any side effects in the last 30 days? No   Please enter the full address (street address, city, state, zip code) where you would like your medication(s) to be delivered to. [address] [place] [address]   Please specify on which day you would like your medication(s) to arrive. Note: if you need your medication(s) within 3 days, please call the pharmacy to schedule your order at [phone]  02/07/2024   Has your insurance changed since your last refill? No   Would you like a pharmacist to call you to discuss your medication(s)? No   Do you require a signature for your package? (Note: if we are billing Medicare Part B or your order contains a controlled substance, we will require a signature) No   I have been provided my out of pocket cost for my medication and approve the pharmacy to charge the amount to my credit card on file. Yes         Completed refill call assessment today to schedule patient's medication shipment from the [facility] and Home Delivery Pharmacy [phone]).  All relevant notes have been reviewed.       Confirmed patient received a Conservation officer, historic buildings and a Surveyor, mining with first shipment. The patient will receive a drug information handout for each medication shipped and additional FDA Medication Guides as required.         REFERRAL TO PHARMACIST     Referral to the pharmacist: Not needed      [facility]     Shipping address confirmed in Epic.     Delivery Scheduled: Yes, Expected medication delivery date: 02/07/24.     Medication will be delivered via UPS to the prescription address in Epic WAM.    Matthew Deleon   [facility] Specialty and Home Delivery Pharmacy Specialty Technician

## 2024-02-06 MED FILL — XOLAIR 300 MG/2 ML SUBCUTANEOUS AUTO-INJECTOR: SUBCUTANEOUS | 28 days supply | Qty: 2 | Fill #11

## 2024-02-28 DIAGNOSIS — L501 Idiopathic urticaria: Principal | ICD-10-CM

## 2024-02-28 MED ORDER — XOLAIR 300 MG/2 ML SUBCUTANEOUS AUTO-INJECTOR
SUBCUTANEOUS | 11 refills | 28.00000 days | Status: CP
Start: 2024-02-28 — End: ?

## 2024-02-28 NOTE — Telephone Encounter (Signed)
 Patient last seen 11/2023.  Refill sent per protocol.

## 2024-02-28 NOTE — Progress Notes (Signed)
 [facility] Specialty and Home Delivery Pharmacy Refill Coordination Note    Matthew Deleon, [place]: 03-18-1961  Phone: [phone] (home)       All above HIPAA information was verified with patient.         02/28/2024     1:31 PM   Specialty Rx Medication Refill Questionnaire   Which Medications would you like refilled and shipped? xolair    Please list all current allergies: daypro   Have you missed any doses in the last 30 days? No   Have you had any changes to your medication(s) since your last refill? No   How much of each medication do you have remaining at home? (eg. number of tablets, injections, etc.) none   If receiving an injectable medication, next injection date is 03/07/2024   Have you experienced any side effects in the last 30 days? No   Please enter the full address (street address, city, state, zip code) where you would like your medication(s) to be delivered to. [address] [place] [address]   Please specify on which day you would like your medication(s) to arrive. Note: if you need your medication(s) within 3 days, please call the pharmacy to schedule your order at [phone]  03/07/2024   Has your insurance changed since your last refill? No   Would you like a pharmacist to call you to discuss your medication(s)? No   Do you require a signature for your package? (Note: if we are billing Medicare Part B or your order contains a controlled substance, we will require a signature) No   I have been provided my out of pocket cost for my medication and approve the pharmacy to charge the amount to my credit card on file. Yes         Completed refill call assessment today to schedule patient's medication shipment from the [facility] and Home Delivery Pharmacy [phone]).  All relevant notes have been reviewed.       Confirmed patient received a Conservation Officer, Historic Buildings and a Surveyor, Mining with first shipment. The patient will receive a drug information handout for each medication shipped and additional FDA Medication Guides as required.         REFERRAL TO PHARMACIST     Referral to the pharmacist: Not needed      [facility]     Shipping address confirmed in Epic.     Delivery Scheduled: Yes, Expected medication delivery date: 03/07/24.     Medication will be delivered via UPS to the prescription address in Epic WAM.    Kyra Myron   [facility] Specialty and Home Delivery Pharmacy Specialty Technician

## 2024-03-06 DIAGNOSIS — L508 Other urticaria: Principal | ICD-10-CM

## 2024-03-06 DIAGNOSIS — L501 Idiopathic urticaria: Principal | ICD-10-CM

## 2024-03-06 MED ORDER — PREDNISONE 10 MG TABLET
ORAL_TABLET | ORAL | 0 refills | 12.00000 days | Status: CP
Start: 2024-03-06 — End: 2024-03-18

## 2024-03-06 MED ORDER — XOLAIR 300 MG/2 ML SUBCUTANEOUS AUTO-INJECTOR
SUBCUTANEOUS | 11 refills | 28.00000 days | Status: CP
Start: 2024-03-06 — End: ?
  Filled 2024-03-07: qty 4, 28d supply, fill #0

## 2024-03-06 NOTE — Patient Instructions (Addendum)
 VISIT SUMMARY:  Today, we discussed your chronic hives and the recurrence of symptoms despite your current Xolair  treatment. You mentioned that your hives are returning earlier than before, causing significant discomfort, especially on your hands. We reviewed your current treatment plan and discussed potential adjustments to better manage your symptoms.    YOUR PLAN:  -CHRONIC URTICARIA (HIVES): Chronic urticaria, or hives, is a condition where red, itchy welts appear on the skin. Your current Xolair  treatment is becoming less effective, so we are considering increasing the frequency of your Xolair  injections to every two weeks, pending insurance approval. In the meantime, we have prescribed a short course of prednisone  to help manage your symptoms. If the increased Xolair  frequency does not work, we may explore other treatments like Dupixent or remibrutinib.    INSTRUCTIONS:  We have submitted an appeal to your insurance for approval to increase your Xolair  dosing to every two weeks. Please follow the prescribed prednisone  taper: 40 mg for one day, 30 mg for one day, 20 mg for one day, 10 mg for one day, then stop. Monitor your response to the increased Xolair  dosing and let us  know if your symptoms do not improve. We will consider alternative treatments if necessary.

## 2024-03-06 NOTE — Progress Notes (Signed)
 Matthew Deleon 's Xolair  shipment will be delayed as a result of prior authorization being required by the patient's insurance.     I have reached out to the patient  at [phone] and communicated the delay. We will call the patient back to reschedule the delivery upon resolution. We have not confirmed the new delivery date.

## 2024-03-06 NOTE — Progress Notes (Signed)
 [facility]  [address]  5th Floor, [address]  [place], [place] [address]    Assessment and Plan:   Matthew Deleon is a 63 y.o. male that was seen in follow-up for the evaluation:    Assessment & Plan  Chronic spontaneous urticaria, not well controlled.   While he originally responded well to Xolair  300 mg every 4 weeks, he has now developed recurrent hives approximately 19-20 days post-Xolair  injection, indicating reduced efficacy of current dosing schedule.  Symptoms persist despite increasing his Zyrtec  to 20 mg twice daily. Current management with Xolair  every four weeks is insufficient.  Given control of symptoms for 2-1/2 to 3 weeks on Xolair , I recommend increasing Xolair  frequency to every two weeks as is consistent with evidence-based care and international guidelines based treatment of chronic spontaneous urticaria (PMID: 65463760). Consideration of alternative treatments such as Dupixent or remibrutinib if Xolair  every two weeks is ineffective. He is experiencing significant discomfort and is willing to try a short course of prednisone  to manage symptoms until the next Xolair  injection.  - Increased Xolair  dosing to 300 mg every two weeks pending insurance approval.  - Continue cetirizine  20 mg BID  - Prescribed prednisone  taper: 40 mg for one day, 30 mg for one day, 20 mg for one day, 10 mg for one day, then off.  - Monitor response to increased Xolair  dosing and adjust as needed.  - Will consider alternative treatments such as Dupixent or remibrutinib if Xolair  every two weeks is ineffective.    No orders of the defined types were placed in this encounter.    Return in about 3 months (around 06/06/2024) for Next scheduled follow up.    I personally spent 30 minutes face-to-face and non-face-to-face in the care of this patient, which includes all pre, intra, and post visit time on the date of service.  All documented time was specific to the E/M visit and does not include any procedures that may have been performed.    Monda Allan, MD  Division of Allergy & Immunology    Subjective   Last Visit: 02/28/2024    HISTORY OF PRESENT ILLNESS:  Matthew Deleon is a 63 y.o. male who presented for an acute visit for refractory chronic spontaneous urticaria. History is obtained from Matthew Deleon. A thorough review of the medical records was also performed.    History of Present Illness  He experiences a recurrence of hives around day nineteen or twenty after his Xolair  injections, which were previously effective for four weeks. Initially, he was doing well on a regimen of Xolair  every four weeks and taking one Zyrtec  daily, with additional doses as needed for grass exposure.    In September, he noticed a change in the effectiveness of the Xolair , with hives reappearing about 1-2 weeks before his next injection. By October, the hives were returning a full 10 days before the next dose. The current episode is described as the worst, with hives affecting his head and hands, causing redness and soreness, and a strong urge to scratch.    He has increased his Zyrtec  intake to four tablets daily during the week when hives break out, but this has not fully controlled the symptoms. Despite the increased Zyrtec , he continues to experience significant discomfort, particularly in his hands, which become red and sore and requests a steroid prescription today as his quality of life is significantly affected.     He reports no recent illness or  changes in his medical history. For two and a half to three weeks post-injection, he experiences good control of his symptoms without breakthrough hives.    He has a history of COPD and asthma, for which he saw a pulmonologist in October. No changes were made to his medications at that time.      Past Medical History:   Past Medical History[1]    Medications:   Current Medications[2]    Allergies:   Allergies[3]    Objective:   PHYSICAL EXAM:  Vitals: BP 125/80 (BP Site: L Arm, BP Position: Sitting, BP Cuff Size: Large)  - Pulse 84  - Wt (!) 117.9 kg (260 lb)  - SpO2 95%  - BMI 40.72 kg/m??   General : No apparent distress. Awake, alert, well appearing.  HEENT: Normocephalic, atraumatic. Mucous membranes are moist. No periorbital edema. Facial muscles move symmetrically.  Neck: Neck is symmetrical with trachea midline.  Eyes: PERRL. Eyelids and conjunctiva normal bilaterally.   Respiratory: Breathing is unlabored, no tachypnea.  Cardiovascular: No edema, no pallor, no cyanosis.  Abdomen: Non-distended.  Skin: No concerning rash or lesions noted on exposed skin.  Extremities: Normal range of motion observed. No peripheral edema.  Neuro: Mood and behavior appropriate for age.  Musculoskeletal: Symmetric and appropriate movements of extremities.           [1]   Past Medical History:  Diagnosis Date    Allergies     Arthritis     Asthma (HHS-HCC)     GERD (gastroesophageal reflux disease)     Gout     Hyperlipidemia     Hypertension     Shortness of breath    [2]   Current Outpatient Medications   Medication Sig Dispense Refill    albuterol  HFA 90 mcg/actuation inhaler Inhale 2 puffs every four (4) hours as needed for wheezing. 8.5 g 10    aspirin (ECOTRIN) 81 MG tablet Take 1 tablet (81 mg total) by mouth daily.      atorvastatin (LIPITOR) 80 MG tablet Take 1 tablet (80 mg total) by mouth daily.  1    budesonide -glycopyr-formoterol  (BREZTRI  AEROSPHERE) 160-9-4.8 mcg/actuation inhaler Inhale 2 puffs in the morning and 2 puffs in the evening. Use with spacer and gargle after each use. 10.7 g 11    buPROPion (WELLBUTRIN XL) 300 MG 24 hr tablet Take 1 tablet (300 mg total) by mouth daily.  1    cetirizine  (ZYRTEC ) 10 MG tablet Take 2 tablets (20 mg total) by mouth two (2) times a day. (Patient taking differently: Take 2 tablets (20 mg total) by mouth daily.) 120 tablet 11    colchicine  (COLCRYS ) 0.6 mg tablet Take 1 tablet (0.6 mg total) by mouth daily as needed (gout flares).      empty container Misc Use as directed to dispose of Xolair  syringes 1 each 2    EPINEPHrine  (EPIPEN ) 0.3 mg/0.3 mL injection Inject 0.3 mL (0.3 mg total) into the muscle once as needed for anaphylaxis for up to 1 dose. 2 each 2    febuxostat (ULORIC) 40 mg tablet Take 1 tablet (40 mg total) by mouth at noon.      furosemide (LASIX) 20 MG tablet Take 1 tablet (20 mg total) by mouth.      hydroCHLOROthiazide (HYDRODIURIL) 25 MG tablet Take 1 tablet (25 mg total) by mouth two (2) times a day.  1    LORazepam (ATIVAN) 2 MG tablet       metoprolol succinate (  TOPROL-XL) 100 MG 24 hr tablet Take 1 tablet (100 mg total) by mouth daily.  1    olmesartan-hydrochlorothiazide (BENICAR HCT) 40-25 mg per tablet Take 1 tablet by mouth daily.      omalizumab  (XOLAIR ) 300 mg/2 mL auto-injector Inject the contents of 1 pen (300 mg) under the skin every twenty-eight (28) days. 2 mL 11    omeprazole (PRILOSEC) 20 MG capsule Take 1 capsule (20 mg total) by mouth daily.  3    semaglutide, weight loss, (WEGOVY) 2.4 mg/0.75 mL injection pen Inject 0.75 mL (2.4 mg total) under the skin every seven (7) days.      tadalafiL (CIALIS) 5 MG tablet Take 1 tablet (5 mg total) by mouth daily as needed.      azelastine  (ASTELIN ) 137 mcg (0.1 %) nasal spray 2 sprays into each nostril two (2) times a day. Use in each nostril as directed 30 mL 11    predniSONE  (DELTASONE ) 10 MG tablet Take 4 tablets (40 mg total) by mouth daily for 1 day, THEN 3 tablets (30 mg total) daily for 1 day, THEN 2 tablets (20 mg total) daily for 1 day, THEN 1 tablet (10 mg total) daily for 1 day. 10 tablet 0     No current facility-administered medications for this visit.   [3]   Allergies  Allergen Reactions    Daypro [Oxaprozin] Anaphylaxis    Hydrocodone Bitartrate Hives    Levofloxacin Hives    Seroquel [Quetiapine]

## 2024-03-06 NOTE — Progress Notes (Addendum)
 [facility] Pharmacist has reviewed a new prescription for Xolair  that indicates a dose increase.  Patient was counseled on this dosage change by Dr. Shellia - see epic note from 11/13. I called Mr. Lawhum and informed him both new and old Xolair  dose requires a PA so we will pursue PA for new dose of 300mg  Q14d and will reach out once approved to reschedule the delivery. He verbalized understanding.  Next refill call date adjusted if necessary.     Shelba Hummer, PharmD  [facility]      Clinical Assessment Needed For: Dose Change  Medication: Xolair  300mg /37mL auto injector  Last Fill Date/Day Supply: 02/06/2024 / 28 days  Prior Authorization Required  Was previous dose already scheduled to fill: Yes    Notes to Pharmacist: Scheduled to fill TODAY 11/13

## 2024-03-07 NOTE — Progress Notes (Signed)
 Matthew Deleon 's Xolair  shipment will be rescheduled as a result of prior authorization now approved.     I have reached out to the patient  at [phone] and communicated the delivery change. We will reschedule the medication for the delivery date that the patient agreed upon.  We have confirmed the delivery date as 11/15, via ups.

## 2024-03-13 ENCOUNTER — Encounter: Admit: 2024-03-13 | Discharge: 2024-03-13 | Payer: BLUE CROSS/BLUE SHIELD

## 2024-03-13 DIAGNOSIS — I10 Essential (primary) hypertension: Principal | ICD-10-CM

## 2024-03-13 DIAGNOSIS — I2510 Atherosclerotic heart disease of native coronary artery without angina pectoris: Principal | ICD-10-CM

## 2024-03-13 DIAGNOSIS — E669 Obesity, unspecified: Principal | ICD-10-CM

## 2024-03-13 LAB — COMPREHENSIVE METABOLIC PANEL
ALBUMIN: 3.8 g/dL (ref 3.5–5.0)
ALKALINE PHOSPHATASE: 80 U/L (ref 46–116)
ALT (SGPT): 74 U/L (ref 12–78)
ANION GAP: 11 mmol/L (ref 3–11)
AST (SGOT): 49 U/L — ABNORMAL HIGH (ref 15–40)
BILIRUBIN TOTAL: 0.9 mg/dL (ref 0.3–1.2)
BLOOD UREA NITROGEN: 20 mg/dL (ref 8–20)
BUN / CREAT RATIO: 15
CALCIUM: 9.4 mg/dL (ref 8.5–10.1)
CHLORIDE: 98 mmol/L (ref 98–107)
CO2: 28.5 mmol/L (ref 21.0–32.0)
CREATININE: 1.36 mg/dL — ABNORMAL HIGH (ref 0.80–1.30)
EGFR CKD-EPI (2021) MALE: 59 mL/min/1.73m2 — ABNORMAL LOW (ref >=60)
GLUCOSE RANDOM: 90 mg/dL (ref 70–99)
POTASSIUM: 4.1 mmol/L (ref 3.5–5.0)
PROTEIN TOTAL: 7.5 g/dL (ref 6.0–8.0)
SODIUM: 137 mmol/L (ref 135–145)

## 2024-03-13 LAB — LIPID PANEL
CHOLESTEROL: 130 mg/dL (ref <200)
HDL CHOLESTEROL: 49 mg/dL (ref >40)
LDL CHOLESTEROL CALCULATED: 57 mg/dL (ref <100)
NON-HDL CHOLESTEROL: 81 mg/dL (ref <130)
TRIGLYCERIDES: 172 mg/dL — ABNORMAL HIGH (ref <150)

## 2024-03-13 LAB — HEMOGLOBIN A1C
ESTIMATED AVERAGE GLUCOSE: 117 mg/dL
HEMOGLOBIN A1C: 5.7 % — ABNORMAL HIGH (ref 4.8–5.6)

## 2024-03-13 LAB — CBC
HEMATOCRIT: 47.2 % (ref 36.0–50.0)
HEMOGLOBIN: 16.3 g/dL (ref 12.5–17.0)
MEAN CORPUSCULAR HEMOGLOBIN CONC: 34.5 g/dL (ref 32.0–36.0)
MEAN CORPUSCULAR HEMOGLOBIN: 30.9 pg (ref 27.0–34.0)
MEAN CORPUSCULAR VOLUME: 89.4 fL (ref 80.0–98.0)
MEAN PLATELET VOLUME: 10.9 fL — ABNORMAL HIGH (ref 7.4–10.4)
PLATELET COUNT: 199 10*9/L (ref 140–415)
RED BLOOD CELL COUNT: 5.28 10*12/L (ref 4.10–5.60)
RED CELL DISTRIBUTION WIDTH: 12.7 % (ref 11.5–14.5)
WBC ADJUSTED: 12.2 10*9/L — ABNORMAL HIGH (ref 4.0–10.5)

## 2024-03-13 LAB — HIGH SENSITIVITY CRP: HIGH SENSITIVE C-REACTIVE PROTEIN (ML,RX,WA,PD, SR): 3.74 mg/L — ABNORMAL HIGH (ref <=3.00)

## 2024-03-13 LAB — PRO-BNP: PRO-BNP: 68 pg/mL (ref 0.0–125.0)

## 2024-03-13 NOTE — Addendum Note (Signed)
 Addended by: Adedamola Seto on: 03/13/2024 01:30 PM     Modules accepted: Orders

## 2024-03-13 NOTE — Progress Notes (Signed)
 DIVISION OF CARDIOLOGY  [facility]        Date of Service: 03/13/2024      PCP: Referring Provider:   Leonce Lucie Amble, PA  [address] [place] [facility]  [place] [address]  Phone: [phone]  Fax: [phone] Leonce Lucie Amble, [place]  [address] [place],  [place] [address]  Phone: [phone]  Fax: [phone]     Assessment and Plan:     45M PMHx CAD, HTN, HLD, CKD, tobacco and alcohol use, obesity, OSA not on CPAP, gout presenting for routine follow up.    #Tachycardia  #Palpitations  Patient's smartwatch shows HR up to 130's. EKG obtained in clinic today shows HR 95 in sinus rhythm and RBBB. Symptoms may be due to patient's prednisone  course which he is completing.  - Consider Zio and TTE, patient preferred to wait until next visit for additional testing    #CAD: Coronary calcifications noted on lung screening CT. PET/CT MPI 2021 showed no ischemia or infarction. Last LDL 44 in 2024.  - Check lipid panel  - ASA 81mg  daily  - Atorvastatin 80mg  daily  - Tirzepatide for obesity   - Counseled on smoking cessation, patient is currently smoking     #HTN: BP at goal 130/80  - Olmesartan/hydrochlorothiazide 40-25mg  daily    Subjective:      History of Present Illness:     45M PMHx CAD, HTN, HLD, CKD, tobacco and alcohol use, obesity, OSA not on CPAP, gout presenting for routine follow up. Patient reports occasional palpitations and his smartwatch showing HR 130's. He denies CP or SOB. He going fishing as exercise. Patient is completing a steroid taper. He was also recently started on tirzepatide for obesity.    Relevant Testing  TTE 01/22/20: LVEF 60-65%  PET/CT MPI 01/27/20: no ischemia or infarction  EKG 03/13/24: HR 95, sinus rhythm, RBBB    Past medical history:  Problem List[1]    Medications:   Patient's Medications   New Prescriptions    No medications on file   Previous Medications    ALBUTEROL  HFA 90 MCG/ACTUATION INHALER Inhale 2 puffs every four (4) hours as needed for wheezing.    ASPIRIN (ECOTRIN) 81 MG TABLET    Take 1 tablet (81 mg total) by mouth daily.    ATORVASTATIN (LIPITOR) 80 MG TABLET    Take 1 tablet (80 mg total) by mouth daily.    AZELASTINE  (ASTELIN ) 137 MCG (0.1 %) NASAL SPRAY    2 sprays into each nostril two (2) times a day. Use in each nostril as directed    BUDESONIDE -GLYCOPYR-FORMOTEROL  (BREZTRI  AEROSPHERE) 160-9-4.8 MCG/ACTUATION INHALER    Inhale 2 puffs in the morning and 2 puffs in the evening. Use with spacer and gargle after each use.    BUPROPION (WELLBUTRIN XL) 300 MG 24 HR TABLET    Take 1 tablet (300 mg total) by mouth daily.    CETIRIZINE  (ZYRTEC ) 10 MG TABLET    Take 2 tablets (20 mg total) by mouth two (2) times a day.    COLCHICINE  (COLCRYS ) 0.6 MG TABLET    Take 1 tablet (0.6 mg total) by mouth daily as needed (gout flares).    EMPTY CONTAINER MISC    Use as directed to dispose of Xolair  syringes    EPINEPHRINE  (EPIPEN ) 0.3 MG/0.3 ML INJECTION    Inject 0.3 mL (0.3 mg total) into the muscle once as needed for anaphylaxis  for up to 1 dose.    FEBUXOSTAT (ULORIC) 40 MG TABLET    Take 1 tablet (40 mg total) by mouth at noon.    FUROSEMIDE (LASIX) 20 MG TABLET    Take 1 tablet (20 mg total) by mouth.    HYDROCHLOROTHIAZIDE (HYDRODIURIL) 25 MG TABLET    Take 1 tablet (25 mg total) by mouth two (2) times a day.    LORAZEPAM (ATIVAN) 2 MG TABLET        METOPROLOL SUCCINATE (TOPROL-XL) 100 MG 24 HR TABLET    Take 1 tablet (100 mg total) by mouth daily.    OLMESARTAN-HYDROCHLOROTHIAZIDE (BENICAR HCT) 40-25 MG PER TABLET    Take 1 tablet by mouth daily.    OMALIZUMAB  (XOLAIR ) 300 MG/2 ML AUTO-INJECTOR    Inject 2 mL (300 mg total) under the skin every fourteen (14) days.    OMEPRAZOLE (PRILOSEC) 20 MG CAPSULE    Take 1 capsule (20 mg total) by mouth daily.    PREDNISONE  (DELTASONE ) 10 MG TABLET    40mg  day 1-3, 30mg  day 4-6, 20mg  day 7-9, 10mg  day 10-12    SEMAGLUTIDE, WEIGHT LOSS, (WEGOVY) 2.4 MG/0.75 ML INJECTION PEN    Inject 0.75 mL (2.4 mg total) under the skin every seven (7) days.    TADALAFIL (CIALIS) 5 MG TABLET    Take 1 tablet (5 mg total) by mouth daily as needed.    TIRZEPATIDE (ZEPBOUND) 12.5 MG/0.5 ML INJECTION PEN    Inject 12.5 mg under the skin every seven (7) days.   Modified Medications    No medications on file   Discontinued Medications    No medications on file       Allergies:  Allergies[2]    Social History:  He  reports that he has been smoking cigarettes. He started smoking about 35 years ago. He has a 17.9 pack-year smoking history. He has been exposed to tobacco smoke. He has never used smokeless tobacco. He reports current alcohol use of about 2.0 standard drinks of alcohol per week. He reports that he does not use drugs.    Family History:  His family history includes Breast cancer in his mother; Coronary artery disease in his brother; Heart attack in his brother; Heart disease in his mother; Pulmonary fibrosis in his father.    Review of Systems  10 systems were reviewed and negative except as noted in HPI.      Objective:       Physical Exam  BP 106/55 (BP Site: L Arm, BP Position: Sitting, BP Cuff Size: Large)  - Pulse 95  - Temp 37 ??C (98.6 ??F) (Temporal)  - Resp 16  - Ht 170.2 cm (5' 7)  - Wt (!) 117.9 kg (260 lb)  - SpO2 97%  - BMI 40.72 kg/m??    Wt Readings from Last 3 Encounters:   03/13/24 (!) 117.9 kg (260 lb)   03/06/24 (!) 117.9 kg (260 lb)   01/23/24 (!) 114.8 kg (253 lb)       General:  Alert, no distress.   Eyes:  Intact, sclerae anicteric.   Neck: No carotid bruit.    Respiratory:   CTAB bilaterally with normal WOB.   Cardiovascular:  No carotid bruit, JVD normal at 90 degrees, RRR without m/r/g.  No edema bilaterally.       Most recent labs   Lab Results   Component Value Date    Sodium 137 07/11/2023    Potassium 4.1 07/11/2023  Chloride 98 07/11/2023    Chloride 95 05/10/2021    CO2 28.3 07/11/2023    BUN 24 (H) 07/11/2023    BUN 26 05/10/2021    Creatinine Whole Blood, POC 2.0 (H) 04/20/2022    Creatinine 1.28 (H) 07/11/2023    Magnesium 1.3 (L) 06/06/2022     Lab Results   Component Value Date    HGB 15.4 07/11/2023    MCV 92.1 07/11/2023    Platelet 148 (L) 07/11/2023     Lab Results   Component Value Date    Cholesterol, Total 125 09/06/2022    Triglycerides 145 09/06/2022    Cholesterol, HDL 52 09/06/2022    Cholesterol, Non-HDL, Calculated 73 09/06/2022    Cholesterol, LDL, Calculated 44 09/06/2022    Hemoglobin A1C 6.0 09/26/2022    TSH 2.260 12/13/2021    PRO-BNP 82.0 01/15/2020          [1]   Patient Active Problem List  Diagnosis    RBBB    Abnormal ECG    OSA (obstructive sleep apnea) - CPAP non-compliance    Tobacco abuse    Alcohol abuse    Morbid obesity (CMS-HCC)    Family history of coronary arteriosclerosis    Atypical chest pain    Dyslipidemia    Essential (primary) hypertension    Coronary artery disease of native artery of native heart with stable angina pectoris    Chronic urticaria    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

## 2024-03-16 LAB — LIPOPROTEIN A (LP(A)): LIPOPROTEIN A: 125 nmol/L — ABNORMAL HIGH

## 2024-03-28 NOTE — Progress Notes (Signed)
 [facility] Specialty and Home Delivery Pharmacy Refill Coordination Note    Matthew Deleon, [place]: 01-02-1961  Phone: [phone] (home)       All above HIPAA information was verified with patient.         03/27/2024    12:46 PM   Specialty Rx Medication Refill Questionnaire   Which Medications would you like refilled and shipped? xolair    Please list all current allergies: day pro   Have you missed any doses in the last 30 days? No   Have you had any changes to your medication(s) since your last refill? No   How much of each medication do you have remaining at home? (eg. number of tablets, injections, etc.) none   If receiving an injectable medication, next injection date is 04/05/2024   Have you experienced any side effects in the last 30 days? No   Please enter the full address (street address, city, state, zip code) where you would like your medication(s) to be delivered to. [address] [place] [address] .   Please specify on which day you would like your medication(s) to arrive. Note: if you need your medication(s) within 3 days, please call the pharmacy to schedule your order at [phone]  04/04/2024   Has your insurance changed since your last refill? No   Would you like a pharmacist to call you to discuss your medication(s)? No   Do you require a signature for your package? (Note: if we are billing Medicare Part B or your order contains a controlled substance, we will require a signature) No   I have been provided my out of pocket cost for my medication and approve the pharmacy to charge the amount to my credit card on file. Yes         Completed refill call assessment today to schedule patient's medication shipment from the [facility] and [facility] Delivery Pharm[facility] [phone]).  All relevant notes have been reviewed.       Confirmed patient received a Conservation Officer, Historic Buildings and a Surveyor, Mining with first shipment. The patient will receive a drug information handout for each medication shipped and additional FDA Medication Guides as required.         REFERRAL TO PHARMACIST     Referral to the pharmacist: Not needed      [facility]     Shipping address confirmed in Epic.     Delivery Scheduled: Yes, Expected medication delivery date: 04/04/24.     Medication will be delivered via UPS to the prescription address in Epic WAM.    Matthew Deleon   [facility] Specialty and Home Delivery Pharmacy Specialty Technician

## 2024-04-03 MED FILL — XOLAIR 300 MG/2 ML SUBCUTANEOUS AUTO-INJECTOR: SUBCUTANEOUS | 28 days supply | Qty: 4 | Fill #1

## 2024-04-30 MED FILL — XOLAIR 300 MG/2 ML SUBCUTANEOUS AUTO-INJECTOR: SUBCUTANEOUS | 28 days supply | Qty: 4 | Fill #2

## 2024-04-30 NOTE — Progress Notes (Signed)
 [facility] Specialty and Home Delivery Pharmacy Refill Coordination Note    Specialty Medication(s) to be Shipped:   CF/Pulmonary/Asthma: Xolair     Other medication(s) to be shipped: No additional medications requested for fill at this time    Specialty Medications not needed at this time: N/A     Matthew Deleon, DOB: 02/23/1961  Phone: [phone] (home)       All above HIPAA information was verified with patient.     Was a nurse, learning disability used for this call? No    Completed refill call assessment today to schedule patient's medication shipment from the [facility] and Home Delivery Pharmacy  [phone]).  All relevant notes have been reviewed.     Specialty medication(s) and dose(s) confirmed: Regimen is correct and unchanged.   Changes to medications: Kenechukwu reports no changes at this time.  Changes to insurance: No  New side effects reported not previously addressed with a pharmacist or physician: None reported  Questions for the pharmacist: No    Confirmed patient received a Conservation Officer, Historic Buildings and a Surveyor, Mining with first shipment. The patient will receive a drug information handout for each medication shipped and additional FDA Medication Guides as required.       DISEASE/MEDICATION-SPECIFIC INFORMATION        N/A    SPECIALTY MEDICATION ADHERENCE     Medication Adherence    Patient reported X missed doses in the last month: 0  Specialty Medication: omalizumab : XOLAIR  300 mg/2 mL auto-injector  Patient is on additional specialty medications: No              Were doses missed due to medication being on hold? No     omalizumab : XOLAIR  300 mg/2 mL auto-injector: 0 doses of medicine on hand       Specialty medication is an injection or given on a cycle: Yes, Next injection is scheduled for 05/01/2024.    REFERRAL TO PHARMACIST     Referral to the pharmacist: Not needed      [facility]     Shipping address confirmed in Epic.     Cost and Payment: Patient has a $0 copay, payment information is not required.    Delivery Scheduled: Yes, Expected medication delivery date: 05/01/2024.     Medication will be delivered via UPS to the prescription address in Epic WAM.    Lucie CHRISTELLA Forts   [facility] Specialty and Home Delivery Pharmacy  Specialty Technician
